# Patient Record
Sex: Female | Born: 1990 | Race: Black or African American | Hispanic: No | Smoking: Former smoker
Health system: Southern US, Community
[De-identification: ages and names within clinical notes are randomized; demographics above are authoritative.]

## PROBLEM LIST (undated history)

## (undated) DIAGNOSIS — J45909 Unspecified asthma, uncomplicated: Secondary | ICD-10-CM

## (undated) DIAGNOSIS — Z87442 Personal history of urinary calculi: Secondary | ICD-10-CM

## (undated) DIAGNOSIS — N946 Dysmenorrhea, unspecified: Secondary | ICD-10-CM

## (undated) HISTORY — DX: Dysmenorrhea, unspecified: N94.6

---
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
## 2012-04-25 ENCOUNTER — Emergency Department: Payer: Self-pay | Admitting: Unknown Physician Specialty

## 2012-04-25 LAB — RAPID INFLUENZA A&B ANTIGENS

## 2012-08-11 ENCOUNTER — Emergency Department: Payer: Self-pay | Admitting: Emergency Medicine

## 2012-08-11 LAB — COMPREHENSIVE METABOLIC PANEL
Alkaline Phosphatase: 72 U/L (ref 50–136)
Anion Gap: 3 — ABNORMAL LOW (ref 7–16)
Bilirubin,Total: 0.4 mg/dL (ref 0.2–1.0)
Co2: 30 mmol/L (ref 21–32)
Creatinine: 1.11 mg/dL (ref 0.60–1.30)
Glucose: 93 mg/dL (ref 65–99)
Osmolality: 273 (ref 275–301)
Potassium: 3.6 mmol/L (ref 3.5–5.1)
SGOT(AST): 26 U/L (ref 15–37)
SGPT (ALT): 27 U/L (ref 12–78)
Sodium: 138 mmol/L (ref 136–145)

## 2012-08-11 LAB — CBC
HCT: 35.9 % (ref 35.0–47.0)
HGB: 12.1 g/dL (ref 12.0–16.0)
MCH: 28.5 pg (ref 26.0–34.0)
MCHC: 33.6 g/dL (ref 32.0–36.0)
MCV: 85 fL (ref 80–100)
Platelet: 392 10*3/uL (ref 150–440)
RBC: 4.23 10*6/uL (ref 3.80–5.20)
RDW: 13.4 % (ref 11.5–14.5)
WBC: 13.6 10*3/uL — ABNORMAL HIGH (ref 3.6–11.0)

## 2012-08-11 LAB — URINALYSIS, COMPLETE
Glucose,UR: NEGATIVE mg/dL (ref 0–75)
Ketone: NEGATIVE
Nitrite: NEGATIVE
Specific Gravity: 1.008 (ref 1.003–1.030)
Squamous Epithelial: 2
WBC UR: 188 /HPF (ref 0–5)

## 2012-08-14 LAB — URINE CULTURE

## 2013-11-30 ENCOUNTER — Emergency Department: Payer: Self-pay | Admitting: Emergency Medicine

## 2014-03-04 ENCOUNTER — Emergency Department: Payer: Self-pay | Admitting: Emergency Medicine

## 2014-06-16 ENCOUNTER — Emergency Department: Payer: Self-pay | Admitting: Emergency Medicine

## 2015-03-30 ENCOUNTER — Emergency Department: Payer: No Typology Code available for payment source

## 2015-03-30 ENCOUNTER — Encounter: Payer: Self-pay | Admitting: *Deleted

## 2015-03-30 ENCOUNTER — Emergency Department
Admission: EM | Admit: 2015-03-30 | Discharge: 2015-03-30 | Disposition: A | Discharge status: Home / Self Care | Payer: No Typology Code available for payment source | Attending: Emergency Medicine | Admitting: Emergency Medicine

## 2015-03-30 DIAGNOSIS — S199XXA Unspecified injury of neck, initial encounter: Secondary | ICD-10-CM | POA: Diagnosis present

## 2015-03-30 DIAGNOSIS — S0083XA Contusion of other part of head, initial encounter: Secondary | ICD-10-CM

## 2015-03-30 DIAGNOSIS — Y998 Other external cause status: Secondary | ICD-10-CM | POA: Insufficient documentation

## 2015-03-30 DIAGNOSIS — S161XXA Strain of muscle, fascia and tendon at neck level, initial encounter: Secondary | ICD-10-CM | POA: Insufficient documentation

## 2015-03-30 DIAGNOSIS — Y9389 Activity, other specified: Secondary | ICD-10-CM | POA: Diagnosis not present

## 2015-03-30 DIAGNOSIS — Z3202 Encounter for pregnancy test, result negative: Secondary | ICD-10-CM | POA: Insufficient documentation

## 2015-03-30 DIAGNOSIS — Y92410 Unspecified street and highway as the place of occurrence of the external cause: Secondary | ICD-10-CM | POA: Diagnosis not present

## 2015-03-30 DIAGNOSIS — S0990XA Unspecified injury of head, initial encounter: Secondary | ICD-10-CM | POA: Insufficient documentation

## 2015-03-30 LAB — POCT PREGNANCY, URINE: Preg Test, Ur: NEGATIVE

## 2015-03-30 MED ORDER — HYDROCODONE-ACETAMINOPHEN 5-325 MG PO TABS
1.0000 | ORAL_TABLET | ORAL | Status: DC | PRN
Start: 2015-03-30 — End: 2018-04-16
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
## 2015-03-30 NOTE — Discharge Instructions (Signed)
Contusion A contusion is a deep bruise. Contusions happen when an injury causes bleeding under the skin. Symptoms of bruising include pain, swelling, and discolored skin. The skin may turn blue, purple, or yellow. HOME CARE   Rest the injured area.  If told, put ice on the injured area.  Put ice in a plastic bag.  Place a towel between your skin and the bag.  Leave the ice on for 20 minutes, 2-3 times per day.  If told, put light pressure (compression) on the injured area using an elastic bandage. Make sure the bandage is not too tight. Remove it and put it back on as told by your doctor.  If possible, raise (elevate) the injured area above the level of your heart while you are sitting or lying down.  Take over-the-counter and prescription medicines only as told by your doctor. GET HELP IF:  Your symptoms do not get better after several days of treatment.  Your symptoms get worse.  You have trouble moving the injured area. GET HELP RIGHT AWAY IF:   You have very bad pain.  You have a loss of feeling (numbness) in a hand or foot.  Your hand or foot turns pale or cold.   This information is not intended to replace advice given to you by your health care provider. Make sure you discuss any questions you have with your health care provider.   Document Released: 09/07/2007 Document Revised: 12/10/2014 Document Reviewed: 08/06/2014 Elsevier Interactive Patient Education 2016 [employer]  Cervical Sprain A cervical sprain is when the tissues (ligaments) that hold the neck bones in place stretch or tear. HOME CARE   Put ice on the injured area.  Put ice in a plastic bag.  Place a towel between your skin and the bag.  Leave the ice on for 15-20 minutes, 3-4 times a day.  You may have been given a collar to wear. This collar keeps your neck from moving while you heal.  Do not take the collar off unless told by your doctor.  If you have long hair, keep it outside of the  collar.  Ask your doctor before changing the position of your collar. You may need to change its position over time to make it more comfortable.  If you are allowed to take off the collar for cleaning or bathing, follow your doctor's instructions on how to do it safely.  Keep your collar clean by wiping it with mild soap and water. Dry it completely. If the collar has removable pads, remove them every 1-2 days to hand wash them with soap and water. Allow them to air dry. They should be dry before you wear them in the collar.  Do not drive while wearing the collar.  Only take medicine as told by your doctor.  Keep all doctor visits as told.  Keep all physical therapy visits as told.  Adjust your work station so that you have good posture while you work.  Avoid positions and activities that make your problems worse.  Warm up and stretch before being active. GET HELP IF:  Your pain is not controlled with medicine.  You cannot take less pain medicine over time as planned.  Your activity level does not improve as expected. GET HELP RIGHT AWAY IF:   You are bleeding.  Your stomach is upset.  You have an allergic reaction to your medicine.  You develop new problems that you cannot explain.  You lose feeling (become numb) or you  cannot move any part of your body (paralysis).  You have tingling or weakness in any part of your body.  Your symptoms get worse. Symptoms include:  Pain, soreness, stiffness, puffiness (swelling), or a burning feeling in your neck.  Pain when your neck is touched.  Shoulder or upper back pain.  Limited ability to move your neck.  Headache.  Dizziness.  Your hands or arms feel week, lose feeling, or tingle.  Muscle spasms.  Difficulty swallowing or chewing. MAKE SURE YOU:   Understand these instructions.  Will watch your condition.  Will get help right away if you are not doing well or get worse.   This information is not intended  to replace advice given to you by your health care provider. Make sure you discuss any questions you have with your health care provider.   Document Released: 09/07/2007 Document Revised: 11/21/2012 Document Reviewed: 09/26/2012 Elsevier Interactive Patient Education [employer]   Follow-up with your doctor at [facility] if any continued problems. Norco as needed for pain. Be aware that you cannot drive or operate machinery while taking this medication.
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
## 2015-04-05 DIAGNOSIS — G4730 Sleep apnea, unspecified: Secondary | ICD-10-CM

## 2015-04-05 HISTORY — DX: Sleep apnea, unspecified: G47.30

## 2015-06-08 ENCOUNTER — Emergency Department
Admission: EM | Admit: 2015-06-08 | Discharge: 2015-06-08 | Disposition: A | Discharge status: Home / Self Care | Payer: No Typology Code available for payment source | Attending: Emergency Medicine | Admitting: Emergency Medicine

## 2015-06-08 DIAGNOSIS — F17200 Nicotine dependence, unspecified, uncomplicated: Secondary | ICD-10-CM | POA: Insufficient documentation

## 2015-06-08 DIAGNOSIS — J069 Acute upper respiratory infection, unspecified: Secondary | ICD-10-CM | POA: Insufficient documentation

## 2015-06-08 MED ORDER — PROMETHAZINE-CODEINE 6.25-10 MG/5ML PO SYRP
5.0000 mL | ORAL_SOLUTION | Freq: Four times a day (QID) | ORAL | Status: DC | PRN
Start: 2015-06-08 — End: 2016-08-29

## 2015-06-08 MED ORDER — HYDROCOD POLST-CPM POLST ER 10-8 MG/5ML PO SUER
5.0000 mL | Freq: Once | ORAL | Status: AC
  Administered 2015-06-08: 5 mL via ORAL
  Filled 2015-06-08: qty 5

## 2015-06-08 NOTE — Discharge Instructions (Signed)

## 2015-06-08 NOTE — ED Provider Notes (Signed)
[facility] Emergency Department Provider Note  ____________________________________________  Time seen: Approximately 9:14 AM  I have reviewed the triage vital signs and the nursing notes.   HISTORY  Chief Complaint Cough and URI    HPI Carol Carter is a 25 y.o. female patient's complaint of cough congestion since yesterday. Patient denies any fever at this complaint. No palliative measures taken for this complaint. Patient states she does not take the flu shot. Patient rates the pain as a 7/10. Patient states chest wall discomfort with prolonged coughing.   No past medical history on file.  There are no active problems to display for this patient.   No past surgical history on file.  Current Outpatient Rx  Name  Route  Sig  Dispense  Refill  . HYDROcodone-acetaminophen (NORCO/VICODIN) 5-325 MG tablet   Oral   Take 1 tablet by mouth every 4 (four) hours as needed for moderate pain.   20 tablet   0   . promethazine-codeine (PHENERGAN WITH CODEINE) 6.25-10 MG/5ML syrup   Oral   Take 5 mLs by mouth every 6 (six) hours as needed for cough.   120 mL   0     Allergies Review of patient's allergies indicates no known allergies.  No family history on file.  Social History Social History  Substance Use Topics  . Smoking status: Current Every Day Smoker  . Smokeless tobacco: Not on file  . Alcohol Use: Yes    Review of Systems Constitutional: No fever/chills Eyes: No visual changes. ENT: No sore throat. Cardiovascular: Denies chest pain. Respiratory: Denies shortness of breath. Nonproductive cough Gastrointestinal: No abdominal pain.  No nausea, no vomiting.  No diarrhea.  No constipation. Genitourinary: Negative for dysuria. Musculoskeletal: Chest wall pain  Skin: Negative for rash. Neurological: Negative for headaches, focal weakness or numbness. 10-point ROS otherwise  negative.  ____________________________________________   PHYSICAL EXAM:  VITAL SIGNS: ED Triage Vitals  Enc Vitals Group     BP 06/08/15 0813 120/78 mmHg     Pulse Rate 06/08/15 0813 85     Resp 06/08/15 0813 18     Temp 06/08/15 0813 98.3 F (36.8 C)     Temp src --      SpO2 06/08/15 0813 96 %     Weight 06/08/15 0813 175 lb (79.379 kg)     Height 06/08/15 0813  (1.6 m)     Head Cir --      Peak Flow --      Pain Score 06/08/15 0815 7     Pain Loc --      Pain Edu? --      Excl. in GC? --     Constitutional: Alert and oriented. Well appearing and in no acute distress. Eyes: Conjunctivae are normal. PERRL. EOMI. Head: Atraumatic. Nose: No congestion/rhinnorhea. Mouth/Throat: Mucous membranes are moist.  Oropharynx non-erythematous. Neck: No stridor.  No cervical spine tenderness to palpation. Hematological/Lymphatic/Immunilogical: No cervical lymphadenopathy. Cardiovascular: Normal rate, regular rhythm. Grossly normal heart sounds.  Good peripheral circulation. Respiratory: Normal respiratory effort.  No retractions. Lungs CTAB. Gastrointestinal: Soft and nontender. No distention. No abdominal bruits. No CVA tenderness. Musculoskeletal: No lower extremity tenderness nor edema.  No joint effusions. Neurologic:  Normal speech and language. No gross focal neurologic deficits are appreciated. No gait instability. Skin:  Skin is warm, dry and intact. No rash noted. Psychiatric: Mood and affect are normal. Speech and behavior are normal.  ____________________________________________   LABS (all labs ordered  are listed, but only abnormal results are displayed)  Labs Reviewed - No data to display ____________________________________________  EKG   ____________________________________________  RADIOLOGY   ____________________________________________   PROCEDURES  Procedure(s) performed: None  Critical Care performed:  No  ____________________________________________   INITIAL IMPRESSION / ASSESSMENT AND PLAN / ED COURSE  Pertinent labs & imaging results that were available during my care of the patient were reviewed by me and considered in my medical decision making (see chart for details).  Upper respiratory infection. Patient given discharge care instructions. Appears given a work note for today. Patient given a prescription for Phenergan With Codeine take for cough. Patient advised follow-up family doctor for continued care. ____________________________________________   FINAL CLINICAL IMPRESSION(S) / ED DIAGNOSES  Final diagnoses:  URI (upper respiratory infection)      Joni Reiningonald K Karyl Sharrar, PA-C 06/08/15 [id number]  Rockne MenghiniAnne-Caroline Norman, MD 06/08/15 1536

## 2015-06-08 NOTE — ED Notes (Signed)
Pt reports cough congestion and uri symptoms since yesterday

## 2016-07-17 HISTORY — PX: WISDOM TOOTH EXTRACTION: SHX21

## 2016-08-29 ENCOUNTER — Encounter: Payer: Self-pay | Admitting: Emergency Medicine

## 2016-08-29 ENCOUNTER — Emergency Department: Payer: BLUE CROSS/BLUE SHIELD

## 2016-08-29 ENCOUNTER — Emergency Department
Admission: EM | Admit: 2016-08-29 | Discharge: 2016-08-29 | Disposition: A | Discharge status: Home / Self Care | Payer: BLUE CROSS/BLUE SHIELD | Attending: Emergency Medicine | Admitting: Emergency Medicine

## 2016-08-29 DIAGNOSIS — J069 Acute upper respiratory infection, unspecified: Secondary | ICD-10-CM | POA: Insufficient documentation

## 2016-08-29 DIAGNOSIS — R0981 Nasal congestion: Secondary | ICD-10-CM | POA: Diagnosis present

## 2016-08-29 DIAGNOSIS — F17200 Nicotine dependence, unspecified, uncomplicated: Secondary | ICD-10-CM | POA: Diagnosis not present

## 2016-08-29 LAB — COMPREHENSIVE METABOLIC PANEL
ALT: 17 U/L (ref 14–54)
AST: 24 U/L (ref 15–41)
Albumin: 4.1 g/dL (ref 3.5–5.0)
Alkaline Phosphatase: 63 U/L (ref 38–126)
Anion gap: 5 (ref 5–15)
BUN: 7 mg/dL (ref 6–20)
CO2: 26 mmol/L (ref 22–32)
CREATININE: 0.9 mg/dL (ref 0.44–1.00)
Calcium: 9.1 mg/dL (ref 8.9–10.3)
Chloride: 107 mmol/L (ref 101–111)
GFR calc Af Amer: >60 mL/min (ref >60)
GFR calc non Af Amer: >60 mL/min (ref >60)
Glucose, Bld: 99 mg/dL (ref 65–99)
Potassium: 3.8 mmol/L (ref 3.5–5.1)
SODIUM: 138 mmol/L (ref 135–145)
Total Bilirubin: 0.7 mg/dL (ref 0.3–1.2)
Total Protein: 8.3 g/dL — ABNORMAL HIGH (ref 6.5–8.1)

## 2016-08-29 LAB — CBC
HCT: 35.3 % (ref 35.0–47.0)
Hemoglobin: 12.1 g/dL (ref 12.0–16.0)
MCH: 29.3 pg (ref 26.0–34.0)
MCHC: 34.2 g/dL (ref 32.0–36.0)
MCV: 85.5 fL (ref 80.0–100.0)
Platelets: 449 10*3/uL — ABNORMAL HIGH (ref 150–440)
RBC: 4.13 MIL/uL (ref 3.80–5.20)
RDW: 13.3 % (ref 11.5–14.5)
WBC: 11.4 10*3/uL — AB (ref 3.6–11.0)

## 2016-08-29 MED ORDER — ACETAMINOPHEN 325 MG PO TABS
ORAL_TABLET | ORAL | Status: AC
  Filled 2016-08-29: qty 2

## 2016-08-29 MED ORDER — ACETAMINOPHEN 325 MG PO TABS
650.0000 mg | ORAL_TABLET | Freq: Once | ORAL | Status: AC
  Administered 2016-08-29: 650 mg via ORAL

## 2016-08-29 MED ORDER — GUAIFENESIN-CODEINE 100-10 MG/5ML PO SYRP: 5.0000 mL | ORAL_SOLUTION | Freq: Three times a day (TID) | ORAL | 0 refills | Status: DC | PRN

## 2016-08-29 NOTE — ED Triage Notes (Signed)
Pt reports sinus congestion, cough, sore throat. Pt has temperature today. Pt reports pain when coughing.

## 2016-08-30 NOTE — ED Provider Notes (Signed)
[facility] Emergency Department Provider Note  ____________________________________________  Time seen: Approximately 7:37 AM  I have reviewed the triage vital signs and the nursing notes.   HISTORY  Chief Complaint URI   HPI Carol Carter is a 26 y.o. female who presents to the emergency department for evaluation of sinus congestion, cough, sore throat for the past 3 days with fever today. She denies nausea, vomiting, or diarrhea. She has taken ibuprofen and over-the-counter cold medications without any relief.   History reviewed. No pertinent past medical history.  There are no active problems to display for this patient.   History reviewed. No pertinent surgical history.  Prior to Admission medications   Medication Sig Start Date End Date Taking? Authorizing Provider  guaiFENesin-codeine (ROBITUSSIN AC) 100-10 MG/5ML syrup Take 5 mLs by mouth 3 (three) times daily as needed for cough. 08/29/16   Advait Buice, Rulon Eisenmengerari B, FNP  HYDROcodone-acetaminophen (NORCO/VICODIN) 5-325 MG tablet Take 1 tablet by mouth every 4 (four) hours as needed for moderate pain. 03/30/15   Tommi RumpsSummers, Rhonda L, PA-C    Allergies Patient has no known allergies.  No family history on file.  Social History Social History  Substance Use Topics  . Smoking status: Current Every Day Smoker  . Smokeless tobacco: Never Used  . Alcohol use Yes    Review of Systems Constitutional: Positive for fever/chills ENT: Positive for sore throat. Cardiovascular: Denies chest pain. Respiratory: Negative for shortness of breath. Positive for cough. Gastrointestinal: Negative for nausea,  no vomiting.  Negative for diarrhea.  Musculoskeletal: Positive for body aches Skin: Negative for rash. Neurological: Positive for headaches ____________________________________________   PHYSICAL EXAM:  VITAL SIGNS: ED Triage Vitals  Enc Vitals Group     BP 08/29/16 1134 120/89     Pulse Rate  08/29/16 1134 (!) 126     Resp 08/29/16 1134 16     Temp 08/29/16 1133 100.1 F (37.8 C)     Temp Source 08/29/16 1134 Oral     SpO2 08/29/16 1134 96 %     Weight 08/29/16 1133 160 lb (72.6 kg)     Height 08/29/16 1133 5\' 3"  (1.6 m)     Head Circumference --      Peak Flow --      Pain Score 08/29/16 1132 8     Pain Loc --      Pain Edu? --      Excl. in GC? --     Constitutional: Alert and oriented. Not acutely ill appearing and in no acute distress. Eyes: Conjunctivae are normal. EOMI. Ears: Bilateral tympanic membranes are within normal limits Nose: Maxillary congestion noted; no rhinnorhea. Mouth/Throat: Mucous membranes are moist.  Oropharynx mildly erythematous. Tonsils not visualized. Neck: No stridor.  Lymphatic: No cervical lymphadenopathy. Cardiovascular: Normal rate, regular rhythm. Good peripheral circulation. Respiratory: Normal respiratory effort.  No retractions. Breath sounds clear to auscultation throughout. Gastrointestinal: Soft and nontender.  Musculoskeletal: FROM x 4 extremities.  Neurologic:  Normal speech and language.  Skin:  Skin is warm, dry and intact. No rash noted. Psychiatric: Mood and affect are normal. Speech and behavior are normal.  ____________________________________________   LABS (all labs ordered are listed, but only abnormal results are displayed)  Labs Reviewed  CBC - Abnormal; Notable for the following:       Result Value   WBC 11.4 (*)    Platelets 449 (*)    All other components within normal limits  COMPREHENSIVE METABOLIC PANEL - Abnormal; Notable  for the following:    Total Protein 8.3 (*)    All other components within normal limits   ____________________________________________  EKG  Not indicated ____________________________________________  RADIOLOGY  Chest x-ray does not show any acute cardiopulmonary abnormality per radiology. ____________________________________________   PROCEDURES  Procedure(s)  performed: None  Critical Care performed: No ____________________________________________   INITIAL IMPRESSION / ASSESSMENT AND PLAN / ED COURSE  26 year old female presenting to the emergency department for evaluation. Symptoms and exam consistent with viral syndrome. She will be given prescription for Robitussin-AC and advised to follow-up with her primary care provider for symptoms that are not improving over the next few days. She was advised to return to the emergency department for symptoms that change or worsen if she is unable schedule appointment.  Pertinent labs & imaging results that were available during my care of the patient were reviewed by me and considered in my medical decision making (see chart for details).  Discharge Medication List as of 08/29/2016  1:58 PM    START taking these medications   Details  guaiFENesin-codeine (ROBITUSSIN AC) 100-10 MG/5ML syrup Take 5 mLs by mouth 3 (three) times daily as needed for cough., Starting Mon 08/29/2016, Print        If controlled substance prescribed during this visit, 12 month history viewed on the [facility] prior to issuing an initial prescription for Schedule II or III opiod. ____________________________________________   FINAL CLINICAL IMPRESSION(S) / ED DIAGNOSES  Final diagnoses:  Acute upper respiratory infection    Note:  This document was prepared using Dragon voice recognition software and may include unintentional dictation errors.     Chinita Pester, FNP 08/30/16 1610    Minna Antis, MD 08/30/16 [phone]

## 2017-01-02 ENCOUNTER — Emergency Department
Admission: EM | Admit: 2017-01-02 | Discharge: 2017-01-02 | Disposition: A | Discharge status: Home / Self Care | Payer: BLUE CROSS/BLUE SHIELD | Attending: Emergency Medicine | Admitting: Emergency Medicine

## 2017-01-02 ENCOUNTER — Emergency Department: Payer: BLUE CROSS/BLUE SHIELD

## 2017-01-02 DIAGNOSIS — R091 Pleurisy: Secondary | ICD-10-CM | POA: Diagnosis not present

## 2017-01-02 DIAGNOSIS — R102 Pelvic and perineal pain: Secondary | ICD-10-CM | POA: Insufficient documentation

## 2017-01-02 DIAGNOSIS — F17210 Nicotine dependence, cigarettes, uncomplicated: Secondary | ICD-10-CM | POA: Insufficient documentation

## 2017-01-02 DIAGNOSIS — R0789 Other chest pain: Secondary | ICD-10-CM | POA: Diagnosis present

## 2017-01-02 DIAGNOSIS — N946 Dysmenorrhea, unspecified: Secondary | ICD-10-CM

## 2017-01-02 LAB — URINALYSIS, COMPLETE (UACMP) WITH MICROSCOPIC
Bacteria, UA: NONE SEEN
Bilirubin Urine: NEGATIVE
Glucose, UA: NEGATIVE mg/dL
Ketones, ur: NEGATIVE mg/dL
Leukocytes, UA: NEGATIVE
Nitrite: NEGATIVE
PROTEIN: NEGATIVE mg/dL
SPECIFIC GRAVITY, URINE: 1.014 (ref 1.005–1.030)
pH: 5 (ref 5.0–8.0)

## 2017-01-02 LAB — CBC
HEMATOCRIT: 35.9 % (ref 35.0–47.0)
HEMOGLOBIN: 12.2 g/dL (ref 12.0–16.0)
MCH: 28.9 pg (ref 26.0–34.0)
MCHC: 33.9 g/dL (ref 32.0–36.0)
MCV: 85.3 fL (ref 80.0–100.0)
Platelets: 438 10*3/uL (ref 150–440)
RBC: 4.21 MIL/uL (ref 3.80–5.20)
RDW: 13.6 % (ref 11.5–14.5)
WBC: 8.8 10*3/uL (ref 3.6–11.0)

## 2017-01-02 LAB — COMPREHENSIVE METABOLIC PANEL
ALBUMIN: 4.2 g/dL (ref 3.5–5.0)
ALK PHOS: 44 U/L (ref 38–126)
ALT: 11 U/L — AB (ref 14–54)
AST: 17 U/L (ref 15–41)
Anion gap: 5 (ref 5–15)
BUN: 9 mg/dL (ref 6–20)
CALCIUM: 9.1 mg/dL (ref 8.9–10.3)
CO2: 29 mmol/L (ref 22–32)
CREATININE: 0.89 mg/dL (ref 0.44–1.00)
Chloride: 105 mmol/L (ref 101–111)
GFR calc Af Amer: >60 mL/min (ref >60)
GFR calc non Af Amer: >60 mL/min (ref >60)
GLUCOSE: 96 mg/dL (ref 65–99)
Potassium: 3.7 mmol/L (ref 3.5–5.1)
SODIUM: 139 mmol/L (ref 135–145)
Total Bilirubin: 0.3 mg/dL (ref 0.3–1.2)
Total Protein: 7.8 g/dL (ref 6.5–8.1)

## 2017-01-02 LAB — TROPONIN I: Troponin I: <0.03 ng/mL (ref <0.03)

## 2017-01-02 LAB — POCT PREGNANCY, URINE: PREG TEST UR: NEGATIVE

## 2017-01-02 MED ORDER — IBUPROFEN 600 MG PO TABS: 600.0000 mg | ORAL_TABLET | Freq: Three times a day (TID) | ORAL | 0 refills | Status: DC | PRN

## 2017-01-02 NOTE — Discharge Instructions (Signed)
Please take your pain medication as prescribed 3 times a day for severe symptoms. Please also make an appointment to reestablish care with your [facility] gynecologist within the next few weeks for recheck. Return to the emergency department sooner for any concerns.  It was a pleasure to take care of you today, and thank you for coming to our emergency department.  If you have any questions or concerns before leaving please ask the nurse to grab me and I'm more than happy to go through your aftercare instructions again.  If you were prescribed any opioid pain medication today such as Norco, Vicodin, Percocet, morphine, hydrocodone, or oxycodone please make sure you do not drive when you are taking this medication as it can alter your ability to drive safely.  If you have any concerns once you are home that you are not improving or are in fact getting worse before you can make it to your follow-up appointment, please do not hesitate to call 911 and come back for further evaluation.  Merrily Brittle, MD  Results for orders placed or performed during the hospital encounter of 01/02/17  CBC  Result Value Ref Range   WBC 8.8 3.6 - 11.0 K/uL   RBC 4.21 3.80 - 5.20 MIL/uL   Hemoglobin 12.2 12.0 - 16.0 g/dL   HCT 16.1 09.6 - 04.5 %   MCV 85.3 80.0 - 100.0 fL   MCH 28.9 26.0 - 34.0 pg   MCHC 33.9 32.0 - 36.0 g/dL   RDW 40.9 81.1 - 91.4 %   Platelets 438 150 - 440 K/uL  Troponin I  Result Value Ref Range   Troponin I <0.03 <0.03 ng/mL  Comprehensive metabolic panel  Result Value Ref Range   Sodium 139 135 - 145 mmol/L   Potassium 3.7 3.5 - 5.1 mmol/L   Chloride 105 101 - 111 mmol/L   CO2 29 22 - 32 mmol/L   Glucose, Bld 96 65 - 99 mg/dL   BUN 9 6 - 20 mg/dL   Creatinine, Ser 7.82 0.44 - 1.00 mg/dL   Calcium 9.1 8.9 - 95.6 mg/dL   Total Protein 7.8 6.5 - 8.1 g/dL   Albumin 4.2 3.5 - 5.0 g/dL   AST 17 15 - 41 U/L   ALT 11 (L) 14 - 54 U/L   Alkaline Phosphatase 44 38 - 126 U/L   Total Bilirubin 0.3  0.3 - 1.2 mg/dL   GFR calc non Af Amer >60 >60 mL/min   GFR calc Af Amer >60 >60 mL/min   Anion gap 5 5 - 15  Urinalysis, Complete w Microscopic  Result Value Ref Range   Color, Urine YELLOW (A) YELLOW   APPearance CLEAR (A) CLEAR   Specific Gravity, Urine 1.014 1.005 - 1.030   pH 5.0 5.0 - 8.0   Glucose, UA NEGATIVE NEGATIVE mg/dL   Hgb urine dipstick MODERATE (A) NEGATIVE   Bilirubin Urine NEGATIVE NEGATIVE   Ketones, ur NEGATIVE NEGATIVE mg/dL   Protein, ur NEGATIVE NEGATIVE mg/dL   Nitrite NEGATIVE NEGATIVE   Leukocytes, UA NEGATIVE NEGATIVE   RBC / HPF TOO NUMEROUS TO COUNT 0 - 5 RBC/hpf   WBC, UA 0-5 0 - 5 WBC/hpf   Bacteria, UA NONE SEEN NONE SEEN   Squamous Epithelial / LPF 0-5 (A) NONE SEEN   Mucus PRESENT   Pregnancy, urine POC  Result Value Ref Range   Preg Test, Ur NEGATIVE NEGATIVE   Dg Chest 2 View  Result Date: 01/02/2017 CLINICAL DATA:  Vaginal bleeding starting 2 days ago. Right chest pain. EXAM: CHEST  2 VIEW COMPARISON:  08/29/2016 FINDINGS: The heart size and mediastinal contours are within normal limits. Both lungs are clear. The visualized skeletal structures are unremarkable. IMPRESSION: No active cardiopulmonary disease. Electronically Signed   By: Gaylyn Rong M.D.   On: 01/02/2017 16:27

## 2017-01-02 NOTE — ED Triage Notes (Signed)
Pt c/o bright red heavy vaginal bleeding X 2 days ago, bleeding has lightened since but having lower abdominal cramping. Pt also c/o right sided sharp and stabbing chest pain to area of right ribs. Pt alert and oriented X4, active, cooperative, pt in NAD. RR even and unlabored, color WNL.

## 2017-01-02 NOTE — ED Provider Notes (Signed)
[facility] Emergency Department Provider Note  ____________________________________________   First MD Initiated Contact with Patient 01/02/17 1808     (approximate)  I have reviewed the triage vital signs and the nursing notes.   HISTORY  Chief Complaint Vaginal Bleeding and Chest Pain    HPI Tierany N Peil is a 26 y.o. female with 2 days of severe pelvic cramping along with heavy vaginal bleeding. Her last menstrual period prior to this was August 29. She reports sharp right sided right lateral chest pain radiating across her upper chest that began today. The pain is mild worse with deep inspiration and improved with rest.she has no history of DVT or pulmonary embolism. No leg swelling. No recent surgery or immobilization.   History reviewed. No pertinent past medical history.  There are no active problems to display for this patient.   History reviewed. No pertinent surgical history.  Prior to Admission medications   Medication Sig Start Date End Date Taking? Authorizing Provider  guaiFENesin-codeine (ROBITUSSIN AC) 100-10 MG/5ML syrup Take 5 mLs by mouth 3 (three) times daily as needed for cough. 08/29/16   Triplett, Rulon Eisenmenger B, FNP  HYDROcodone-acetaminophen (NORCO/VICODIN) 5-325 MG tablet Take 1 tablet by mouth every 4 (four) hours as needed for moderate pain. 03/30/15   Tommi Rumps, PA-C  ibuprofen (ADVIL,MOTRIN) 600 MG tablet Take 1 tablet (600 mg total) by mouth every 8 (eight) hours as needed. 01/02/17   Merrily Brittle, MD    Allergies Patient has no known allergies.  No family history on file.  Social History Social History  Substance Use Topics  . Smoking status: Current Every Day Smoker  . Smokeless tobacco: Never Used  . Alcohol use Yes    Review of Systems Constitutional: No fever/chills ENT: No sore throat. Cardiovascular: positive for chest pain. Respiratory: Denies shortness of breath. Gastrointestinal: positive for  abdominal pain.  positive for nausea, no vomiting.  No diarrhea.  No constipation. Musculoskeletal: Negative for back pain. Neurological: Negative for headaches   ____________________________________________   PHYSICAL EXAM:  VITAL SIGNS: ED Triage Vitals [01/02/17 1600]  Enc Vitals Group     BP 138/90     Pulse Rate 85     Resp 18     Temp 99.4 F (37.4 C)     Temp Source Oral     SpO2 100 %     Weight 162 lb (73.5 kg)     Height  (1.6 m)     Head Circumference      Peak Flow      Pain Score 4     Pain Loc      Pain Edu?      Excl. in GC?     Constitutional: alert and oriented 4 joking and laughing well appearing nontoxic no diaphoresis speaks full clear sentences Head: Atraumatic. Nose: No congestion/rhinnorhea. Mouth/Throat: No trismus Neck: No stridor.   Cardiovascular: regular rate and rhythm focally tender along right lateral chest Respiratory: Normal respiratory effort.  No retractions. clear to auscultation bilaterally Gastrointestinal: soft nontender Neurologic:  Normal speech and language. No gross focal neurologic deficits are appreciated.  Skin:  Skin is warm, dry and intact. No rash noted.    ____________________________________________  LABS (all labs ordered are listed, but only abnormal results are displayed)  Labs Reviewed  COMPREHENSIVE METABOLIC PANEL - Abnormal; Notable for the following:       Result Value   ALT 11 (*)    All other components within  normal limits  URINALYSIS, COMPLETE (UACMP) WITH MICROSCOPIC - Abnormal; Notable for the following:    Color, Urine YELLOW (*)    APPearance CLEAR (*)    Hgb urine dipstick MODERATE (*)    Squamous Epithelial / LPF 0-5 (*)    All other components within normal limits  CBC  TROPONIN I  POC URINE PREG, ED  POCT PREGNANCY, URINE    blood work reviewed and interpreted by me shows no acute disease __________________________________________  EKG  ED ECG REPORT I, Merrily Brittle,  the attending physician, personally viewed and interpreted this ECG.  Date: 01/02/2017 EKG Time:  Rate: 58 Rhythm: sinus bradycardia QRS Axis: normal Intervals: normal ST/T Wave abnormalities: normal Narrative Interpretation: no evidence of acute ischemia  ____________________________________________  RADIOLOGY  chest x-ray reviewed by me shows no acute disease ____________________________________________   PROCEDURES  Procedure(s) performed: no  Procedures  Critical Care performed: no  Observation: no ____________________________________________   INITIAL IMPRESSION / ASSESSMENT AND PLAN / ED COURSE  Pertinent labs & imaging results that were available during my care of the patient were reviewed by me and considered in my medical decision making (see chart for details).  the patient arrives 2 issues. First her chest pain is extremely atypical and pleuritic. Doubt pulmonary embolism as she is PERC negative.  regarding her pelvic pain and vaginal bleeding she has no signs of anemia and she is only been bleeding for 2 days. I've advised her to follow up with her OB gynecologist. NSAIDs will work for both of these disease processes. Strict return precautions given and she is medically stable for outpatient management verbalizes understanding and agreement with the plan.      ____________________________________________   FINAL CLINICAL IMPRESSION(S) / ED DIAGNOSES  Final diagnoses:  Menstrual cramps  Pleurisy      NEW MEDICATIONS STARTED DURING THIS VISIT:  Discharge Medication List as of 01/02/2017  6:20 PM    START taking these medications   Details  ibuprofen (ADVIL,MOTRIN) 600 MG tablet Take 1 tablet (600 mg total) by mouth every 8 (eight) hours as needed., Starting Mon 01/02/2017, Print         Note:  This document was prepared using Dragon voice recognition software and may include unintentional dictation errors.      Merrily Brittle,  MD 01/02/17 [phone]

## 2017-05-08 ENCOUNTER — Encounter: Payer: Self-pay | Admitting: Emergency Medicine

## 2017-05-08 ENCOUNTER — Other Ambulatory Visit: Payer: Self-pay

## 2017-05-08 ENCOUNTER — Emergency Department
Admission: EM | Admit: 2017-05-08 | Discharge: 2017-05-08 | Disposition: A | Discharge status: Home / Self Care | Payer: 59 | Attending: Emergency Medicine | Admitting: Emergency Medicine

## 2017-05-08 ENCOUNTER — Emergency Department: Payer: 59

## 2017-05-08 DIAGNOSIS — K529 Noninfective gastroenteritis and colitis, unspecified: Secondary | ICD-10-CM

## 2017-05-08 DIAGNOSIS — F17210 Nicotine dependence, cigarettes, uncomplicated: Secondary | ICD-10-CM | POA: Diagnosis not present

## 2017-05-08 DIAGNOSIS — R1011 Right upper quadrant pain: Secondary | ICD-10-CM | POA: Diagnosis not present

## 2017-05-08 DIAGNOSIS — R1010 Upper abdominal pain, unspecified: Secondary | ICD-10-CM | POA: Diagnosis present

## 2017-05-08 LAB — CBC
HEMATOCRIT: 40.2 % (ref 35.0–47.0)
HEMOGLOBIN: 13.4 g/dL (ref 12.0–16.0)
MCH: 29 pg (ref 26.0–34.0)
MCHC: 33.3 g/dL (ref 32.0–36.0)
MCV: 87.1 fL (ref 80.0–100.0)
Platelets: 456 10*3/uL — ABNORMAL HIGH (ref 150–440)
RBC: 4.62 MIL/uL (ref 3.80–5.20)
RDW: 14 % (ref 11.5–14.5)
WBC: 4.7 10*3/uL (ref 3.6–11.0)

## 2017-05-08 LAB — COMPREHENSIVE METABOLIC PANEL
ALBUMIN: 4.3 g/dL (ref 3.5–5.0)
ALK PHOS: 57 U/L (ref 38–126)
ALT: 12 U/L — ABNORMAL LOW (ref 14–54)
ANION GAP: 7 (ref 5–15)
AST: 19 U/L (ref 15–41)
BILIRUBIN TOTAL: 0.6 mg/dL (ref 0.3–1.2)
BUN: 9 mg/dL (ref 6–20)
CALCIUM: 9.2 mg/dL (ref 8.9–10.3)
CO2: 27 mmol/L (ref 22–32)
Chloride: 103 mmol/L (ref 101–111)
Creatinine, Ser: 0.9 mg/dL (ref 0.44–1.00)
GFR calc Af Amer: >60 mL/min (ref >60)
GFR calc non Af Amer: >60 mL/min (ref >60)
GLUCOSE: 93 mg/dL (ref 65–99)
POTASSIUM: 3.5 mmol/L (ref 3.5–5.1)
SODIUM: 137 mmol/L (ref 135–145)
TOTAL PROTEIN: 8.6 g/dL — AB (ref 6.5–8.1)

## 2017-05-08 LAB — URINALYSIS, COMPLETE (UACMP) WITH MICROSCOPIC
Bilirubin Urine: NEGATIVE
Glucose, UA: NEGATIVE mg/dL
Ketones, ur: NEGATIVE mg/dL
NITRITE: NEGATIVE
PH: 5 (ref 5.0–8.0)
Protein, ur: NEGATIVE mg/dL
SPECIFIC GRAVITY, URINE: 1.015 (ref 1.005–1.030)

## 2017-05-08 LAB — LIPASE, BLOOD: Lipase: 20 U/L (ref 11–51)

## 2017-05-08 LAB — POCT PREGNANCY, URINE: PREG TEST UR: NEGATIVE

## 2017-05-08 MED ORDER — ONDANSETRON HCL 4 MG PO TABS
4.0000 mg | ORAL_TABLET | Freq: Once | ORAL | Status: AC
  Administered 2017-05-08: 4 mg via ORAL
  Filled 2017-05-08: qty 1

## 2017-05-08 MED ORDER — ONDANSETRON 8 MG PO TBDP: 8.0000 mg | ORAL_TABLET | Freq: Three times a day (TID) | ORAL | 0 refills | Status: DC | PRN

## 2017-05-08 NOTE — ED Notes (Signed)
Pt taken to US

## 2017-05-08 NOTE — ED Triage Notes (Signed)
Abdominal pain, nausea and vomiting x 2 days.  

## 2017-05-08 NOTE — ED Notes (Signed)
Pt c/o N/V/D for the past 2 days with lower abd pain. Today vomiting x4, diarrhea x2..Marland Kitchen

## 2017-05-09 NOTE — ED Provider Notes (Signed)
[facility] Emergency Department Provider Note    First MD Initiated Contact with Patient 05/08/17 1535     (approximate)  I have reviewed the triage vital signs and the nursing notes.   HISTORY  Chief Complaint Abdominal Pain    HPI Carol Carter is a 27 y.o. female presents to the emergency department with a 2-day history of upper abdominal discomfort associated with nausea vomiting and nonbloody diarrhea.  Patient denies any fever.  Patient states that symptoms began on Saturday and has improved since onset.  Patient states last diarrheal episode was yesterday however nausea and vomiting has persisted.  Patient states her current pain score for her abdominal discomfort is 6 out of 10 and described as aching.   Past medical history None There are no active problems to display for this patient.   Past surgical history None  Prior to Admission medications   Medication Sig Start Date End Date Taking? Authorizing Provider  guaiFENesin-codeine (ROBITUSSIN AC) 100-10 MG/5ML syrup Take 5 mLs by mouth 3 (three) times daily as needed for cough. 08/29/16   Triplett, Rulon Eisenmengerari B, FNP  HYDROcodone-acetaminophen (NORCO/VICODIN) 5-325 MG tablet Take 1 tablet by mouth every 4 (four) hours as needed for moderate pain. 03/30/15   Tommi RumpsSummers, Rhonda L, PA-C  ibuprofen (ADVIL,MOTRIN) 600 MG tablet Take 1 tablet (600 mg total) by mouth every 8 (eight) hours as needed. 01/02/17   Merrily Brittleifenbark, Neil, MD  ondansetron (ZOFRAN ODT) 8 MG disintegrating tablet Take 1 tablet (8 mg total) by mouth every 8 (eight) hours as needed for nausea or vomiting. 05/08/17   Darci CurrentBrown, [place] N, MD    Allergies No known drug allergies No family history on file.  Social History Social History   Tobacco Use  . Smoking status: Current Every Day Smoker    Packs/day: 0.10    Types: Cigarettes  . Smokeless tobacco: Never Used  Substance Use Topics  . Alcohol use: Yes  . Drug use: No    Review of  Systems Constitutional: No fever/chills Eyes: No visual changes. ENT: No sore throat. Cardiovascular: Denies chest pain. Respiratory: Denies shortness of breath. Gastrointestinal: Positive for abdominal pain vomiting and diarrhea. Genitourinary: Negative for dysuria. Musculoskeletal: Negative for neck pain.  Negative for back pain. Integumentary: Negative for rash. Neurological: Negative for headaches, focal weakness or numbness.   ____________________________________________   PHYSICAL EXAM:  VITAL SIGNS: ED Triage Vitals  Enc Vitals Group     BP 05/08/17 1311 111/79     Pulse Rate 05/08/17 1311 70     Resp 05/08/17 1311 20     Temp 05/08/17 1311 98.7 F (37.1 C)     Temp Source 05/08/17 1311 Oral     SpO2 05/08/17 1311 98 %     Weight 05/08/17 1312 68.9 kg (152 lb)     Height 05/08/17 1312 1.6 m (5\' 3" )     Head Circumference --      Peak Flow --      Pain Score 05/08/17 1312 8     Pain Loc --      Pain Edu? --      Excl. in GC? --     Constitutional: Alert and oriented. Well appearing and in no acute distress. Eyes: Conjunctivae are normal.  Head: Atraumatic. Mouth/Throat: Mucous membranes are moist. Oropharynx non-erythematous. Neck: No stridor.  Cardiovascular: Normal rate, regular rhythm. Good peripheral circulation. Grossly normal heart sounds. Respiratory: Normal respiratory effort.  No retractions. Lungs CTAB. Gastrointestinal: Soft and  nontender. No distention.  Musculoskeletal: No lower extremity tenderness nor edema. No gross deformities of extremities. Neurologic:  Normal speech and language. No gross focal neurologic deficits are appreciated.  Skin:  Skin is warm, dry and intact. No rash noted. Psychiatric: Mood and affect are normal. Speech and behavior are normal.  ____________________________________________   LABS (all labs ordered are listed, but only abnormal results are displayed)  Labs Reviewed  COMPREHENSIVE METABOLIC PANEL - Abnormal;  Notable for the following components:      Result Value   Total Protein 8.6 (*)    ALT 12 (*)    All other components within normal limits  CBC - Abnormal; Notable for the following components:   Platelets 456 (*)    All other components within normal limits  URINALYSIS, COMPLETE (UACMP) WITH MICROSCOPIC - Abnormal; Notable for the following components:   Color, Urine YELLOW (*)    APPearance CLOUDY (*)    Hgb urine dipstick SMALL (*)    Leukocytes, UA SMALL (*)    All other components within normal limits  LIPASE, BLOOD  POC URINE PREG, ED  POCT PREGNANCY, URINE    RADIOLOGY I, [place] N Abed Schar, personally viewed and evaluated these images (plain radiographs) as part of my medical decision making, as well as reviewing the written report by the radiologist.  CLINICAL DATA:  Right upper quadrant pain for 3 days  EXAM: ULTRASOUND ABDOMEN LIMITED RIGHT UPPER QUADRANT  COMPARISON:  None.  FINDINGS: Gallbladder:  No gallstones or wall thickening visualized. No sonographic Murphy sign noted by sonographer.  Common bile duct:  Diameter: 2 mm.  Liver:  No focal lesion identified. Within normal limits in parenchymal echogenicity. Portal vein is patent on color Doppler imaging with normal direction of blood flow towards the liver.  IMPRESSION: No acute abnormality noted.   Electronically Signed   By: Alcide Clever M.D.   On: 05/08/2017 16:31  Official radiology report(s): No results found.    Procedures   ____________________________________________   INITIAL IMPRESSION / ASSESSMENT AND PLAN / ED COURSE  As part of my medical decision making, I reviewed the following data within the electronic MEDICAL RECORD 27 year old female present with above-stated history of nausea vomiting and nonbloody diarrhea which is improving since Saturday.  Physical exam revealed no evidence of abdominal pain however patient did admit to upper abdominal pain and as such  ultrasound of the abdomen performed given family history of gallstones.  Ultrasound was negative.  Laboratory data unremarkable suspect gastroenteritis as etiology for the patient's discomfort.  Patient received Zofran and IV normal saline the emergency department with improvement of symptoms.  Patient is advised to return to the emergency department if pain persists or vomiting and diarrhea.  ___________________________  FINAL CLINICAL IMPRESSION(S) / ED DIAGNOSES  Final diagnoses:  RUQ pain  Gastroenteritis     MEDICATIONS GIVEN DURING THIS VISIT:  Medications  ondansetron (ZOFRAN) tablet 4 mg (4 mg Oral Given 05/08/17 1633)     ED Discharge Orders        Ordered    ondansetron (ZOFRAN ODT) 8 MG disintegrating tablet  Every 8 hours PRN     05/08/17 1638       Note:  This document was prepared using Dragon voice recognition software and may include unintentional dictation errors.    Darci Current, MD 05/09/17 [phone]

## 2017-09-15 ENCOUNTER — Ambulatory Visit (INDEPENDENT_AMBULATORY_CARE_PROVIDER_SITE_OTHER): Payer: 59 | Admitting: Obstetrics & Gynecology

## 2017-09-15 ENCOUNTER — Other Ambulatory Visit: Payer: Self-pay

## 2017-09-15 ENCOUNTER — Encounter: Payer: Self-pay | Admitting: Obstetrics & Gynecology

## 2017-09-15 VITALS — BP 110/70 | HR 100 | Ht 63.0 in | Wt 164.0 lb

## 2017-09-15 DIAGNOSIS — Z131 Encounter for screening for diabetes mellitus: Secondary | ICD-10-CM | POA: Diagnosis not present

## 2017-09-15 DIAGNOSIS — Z202 Contact with and (suspected) exposure to infections with a predominantly sexual mode of transmission: Secondary | ICD-10-CM | POA: Diagnosis not present

## 2017-09-15 DIAGNOSIS — Z1329 Encounter for screening for other suspected endocrine disorder: Secondary | ICD-10-CM | POA: Diagnosis not present

## 2017-09-15 DIAGNOSIS — R102 Pelvic and perineal pain: Secondary | ICD-10-CM | POA: Diagnosis not present

## 2017-09-15 DIAGNOSIS — Z124 Encounter for screening for malignant neoplasm of cervix: Secondary | ICD-10-CM

## 2017-09-15 DIAGNOSIS — N921 Excessive and frequent menstruation with irregular cycle: Secondary | ICD-10-CM | POA: Diagnosis not present

## 2017-09-15 LAB — POCT URINE PREGNANCY: PREG TEST UR: NEGATIVE

## 2017-09-15 NOTE — Patient Instructions (Signed)
Pelvic Pain, Female °Pelvic pain is pain in your lower belly (abdomen), below your belly button and between your hips. The pain may start suddenly (acute), keep coming back (recurring), or last a long time (chronic). Pelvic pain that lasts longer than six months is considered chronic. There are many causes of pelvic pain. Sometimes the cause of your pelvic pain is not known. °Follow these instructions at home: °· Take over-the-counter and prescription medicines only as told by your doctor. °· Rest as told by your doctor. °· Do not have sex it if hurts. °· Keep a journal of your pelvic pain. Write down: °? When the pain started. °? Where the pain is located. °? What seems to make the pain better or worse, such as food or your menstrual cycle. °? Any symptoms you have along with the pain. °· Keep all follow-up visits as told by your doctor. This is important. °Contact a doctor if: °· Medicine does not help your pain. °· Your pain comes back. °· You have new symptoms. °· You have unusual vaginal discharge or bleeding. °· You have a fever or chills. °· You are having a hard time pooping (constipation). °· You have blood in your pee (urine) or poop (stool). °· Your pee smells bad. °· You feel weak or lightheaded. °Get help right away if: °· You have sudden pain that is very bad. °· Your pain continues to get worse. °· You have very bad pain and also have any of the following symptoms: °? A fever. °? Feeling stick to your stomach (nausea). °? Throwing up (vomiting). °? Being very sweaty. °· You pass out (lose consciousness). °This information is not intended to replace advice given to you by your health care provider. Make sure you discuss any questions you have with your health care provider. °Document Released: 09/07/2007 Document Revised: 04/15/2015 Document Reviewed: 01/09/2015 °Elsevier Interactive Patient Education © 2018 [employer] Inc[employer] ° °

## 2017-09-15 NOTE — Progress Notes (Signed)
HPI:      [name] is a 27 y.o. G0P0000 who LMP was Patient's last menstrual period was 08/18/2017., she presents today for her annual examination. The patient has concerns over inability to conceive.  Also has lower pelvic pains at times and irreg menses.  Was told in past she has PCOS.  Period this year are Jan, Feb, May.. The patient is sexually active, same partner 2 years, no BC but has not been actively trying for pregnancy until recently.  [facility]; Clomid, Metformin, hCG shots, no success in 3 cycles of trying (different partner then). Denies dyspareunia.  Her last pap: was normal. The patient does perform self breast exams.  There is no notable family history of breast or ovarian cancer in her family.  The patient has regular exercise: yes.  The patient denies current symptoms of depression.    GYN History: Contraception: none  PMHx: Past Medical History:  Diagnosis Date  . Dysmenorrhea    Past Surgical History:  Procedure Laterality Date  . WISDOM TOOTH EXTRACTION  07/17/2016   Family History  Problem Relation Age of Onset  . Thyroid disease Mother   . Hypertension Brother   . Colon cancer Maternal Aunt [id number]   Social History   Tobacco Use  . Smoking status: Current Every Day Smoker    Packs/day: 0.10    Types: Cigarettes  . Smokeless tobacco: Never Used  Substance Use Topics  . Alcohol use: Yes    Comment: Occasional  . Drug use: No    Current Outpatient Medications:  .  guaiFENesin-codeine (ROBITUSSIN AC) 100-10 MG/5ML syrup, Take 5 mLs by mouth 3 (three) times daily as needed for cough. (Patient not taking: Reported on 09/15/2017), Disp: 120 mL, Rfl: 0 .  HYDROcodone-acetaminophen (NORCO/VICODIN) 5-325 MG tablet, Take 1 tablet by mouth every 4 (four) hours as needed for moderate pain. (Patient not taking: Reported on 09/15/2017), Disp: 20 tablet, Rfl: 0 .  ibuprofen (ADVIL,MOTRIN) 600 MG tablet, Take 1 tablet (600 mg total) by  mouth every 8 (eight) hours as needed. (Patient not taking: Reported on 09/15/2017), Disp: 30 tablet, Rfl: 0 .  ondansetron (ZOFRAN ODT) 8 MG disintegrating tablet, Take 1 tablet (8 mg total) by mouth every 8 (eight) hours as needed for nausea or vomiting. (Patient not taking: Reported on 09/15/2017), Disp: 20 tablet, Rfl: 0 Allergies: Patient has no known allergies.  Review of Systems  Constitutional: Positive for malaise/fatigue. Negative for chills and fever.  HENT: Negative for congestion, sinus pain and sore throat.   Eyes: Negative for blurred vision and pain.  Respiratory: Negative for cough and wheezing.   Cardiovascular: Negative for chest pain and leg swelling.  Gastrointestinal: Positive for nausea. Negative for abdominal pain, constipation, diarrhea, heartburn and vomiting.  Genitourinary: Positive for frequency. Negative for dysuria, hematuria and urgency.  Musculoskeletal: Negative for back pain, joint pain, myalgias and neck pain.  Skin: Negative for itching and rash.  Neurological: Positive for headaches. Negative for dizziness, tremors and weakness.  Endo/Heme/Allergies: Does not bruise/bleed easily.  Psychiatric/Behavioral: Negative for depression. The patient is not nervous/anxious and does not have insomnia.     Objective: BP 110/70 (BP Location: Right Arm, Patient Position: Sitting, Cuff Size: Normal)   Pulse 100   Ht 5\' 3"  (1.6 m)   Wt 164 lb (74.4 kg)   LMP 08/18/2017   BMI 29.05 kg/m   Filed Weights   09/15/17 1437  Weight: 164 lb (74.4  kg)   Body mass index is 29.05 kg/m. Physical Exam  Constitutional: She is oriented to person, place, and time. She appears well-developed and well-nourished. No distress.  Genitourinary: Rectum normal, vagina normal and uterus normal. Pelvic exam was performed with patient supine. There is no rash or lesion on the right labia. There is no rash or lesion on the left labia. Vagina exhibits no lesion. No bleeding in the vagina.  Right adnexum does not display mass and does not display tenderness. Left adnexum does not display mass and does not display tenderness. Cervix does not exhibit motion tenderness, lesion, friability or polyp.   Uterus is mobile and midaxial. Uterus is not enlarged or exhibiting a mass.  Genitourinary Comments: No mass or fibroid  HENT:  Head: Normocephalic and atraumatic. Head is without laceration.  Right Ear: Hearing normal.  Left Ear: Hearing normal.  Nose: No epistaxis.  No foreign bodies.  Mouth/Throat: Uvula is midline, oropharynx is clear and moist and mucous membranes are normal.  Eyes: Pupils are equal, round, and reactive to light.  Neck: Normal range of motion. Neck supple. No thyromegaly present.  Cardiovascular: Normal rate and regular rhythm. Exam reveals no gallop and no friction rub.  No murmur heard. Pulmonary/Chest: Effort normal and breath sounds normal. No respiratory distress. She has no wheezes. Right breast exhibits no mass, no skin change and no tenderness. Left breast exhibits no mass, no skin change and no tenderness.  Abdominal: Soft. Bowel sounds are normal. She exhibits no distension. There is no tenderness. There is no rebound.  Musculoskeletal: Normal range of motion.  Neurological: She is alert and oriented to person, place, and time. No cranial nerve deficit.  Skin: Skin is warm and dry.  Psychiatric: She has a normal mood and affect. Judgment normal.  Vitals reviewed.   Assessment:  ANNUAL EXAM 1. Irregular intermenstrual bleeding   2. Pelvic pain   3. STD exposure   4. Screening for cervical cancer   5. Screening for thyroid disorder   6. Screening for diabetes mellitus      Screening Plan:            1.  Cervical Screening-  Pap smear done today  2. Breast screening- Exam annually and mammogram>40 planned   3. Colonoscopy every 10 years, Hemoccult testing - after age 5  4. Labs Ordered today  5. Counseling for contraception: no  method  Other:  1. Irregular intermenstrual bleeding - POCT urine pregnancy - US PELVIS TRANSVANGINAL NON-OB (TV ONLY); Future - Options discussed for ovulation prediction (OPK) and stimulation, such as with Metformin (SE in past but willing to try again) and /or OCP for 3 mos then cycle off, vs REI referral again for more aggressive tx  2. Pelvic pain - US PELVIS TRANSVANGINAL NON-OB (TV ONLY); Future - Consider surgery for endometriosis eval, for pain and infertility possibilities, although pain is more dysmenorrhea and less endometriosis-like.  3. STD exposure - HEP, RPR, HIV Panel - HSV(herpes smplx)abs-1+2(IgG+IgM)-bld - IGP,CtNgTv,rfx Aptima HPV ASCU  4. Screening for cervical cancer - IGP,CtNgTv,rfx Aptima HPV ASCU  5. Screening for thyroid disorder - TSH  6. Screening for diabetes mellitus - Hemoglobin A1c      F/U  Return in about 1 week (around 09/22/2017) for Follow up after GYN [place].  Annamarie Major, MD, [facility], [facility] 09/15/2017  3:41 PM

## 2017-09-18 ENCOUNTER — Telehealth: Payer: Self-pay

## 2017-09-18 NOTE — Telephone Encounter (Signed)
LMVM TRC. F/U w/pt to see if she was seen anywhere or if she was still in need of assistance.

## 2017-09-18 NOTE — Telephone Encounter (Signed)
Weekend Triage Call: pt reports she was seen 09/15/17 & had pap smear. She is bleeding/cramping. Rates 7/10 on pain scale. Pt was advised to be seen in 3-4 hours by ER or Urgent care since our office wasn't open. No records of pt being seen in Epic.

## 2017-09-19 NOTE — Telephone Encounter (Signed)
Spoke w/pt. Advised bleeding is likely her period according to LMP she would have been due for her cycle this past weekend. Pt's pain is better. She will monitor for heavy bleeding & call if she has any further questions/concerns.

## 2017-09-19 NOTE — Telephone Encounter (Signed)
Patient returning call, she did not follow up at ER, didn't have anyone to keep her kids.  She states she is still having some pain off and on and still bleeding but not sure if this might be her cycle that has started.  Please return call.

## 2017-09-20 LAB — HSV(HERPES SMPLX)ABS-I+II(IGG+IGM)-BLD
HSV 1 Glycoprotein G Ab, IgG: <0.91 index (ref 0.00–0.90)
HSV 2 IgG, Type Spec: <0.91 index (ref 0.00–0.90)
HSVI/II Comb IgM: 0.98 Ratio — ABNORMAL HIGH (ref 0.00–0.90)

## 2017-09-20 LAB — HEP, RPR, HIV PANEL
HEP B S AG: NEGATIVE
HIV Screen 4th Generation wRfx: NONREACTIVE
RPR: NONREACTIVE

## 2017-09-20 LAB — TSH: TSH: 0.664 u[IU]/mL (ref 0.450–4.500)

## 2017-09-20 LAB — HEMOGLOBIN A1C
Est. average glucose Bld gHb Est-mCnc: 108 mg/dL
Hgb A1c MFr Bld: 5.4 % (ref 4.8–5.6)

## 2017-09-21 LAB — IGP,CTNGTV,RFX APTIMA HPV ASCU
Chlamydia, Nuc. Acid Amp: NEGATIVE
GONOCOCCUS, NUC. ACID AMP: NEGATIVE
PAP Smear Comment: 0
TRICH VAG BY NAA: NEGATIVE

## 2017-09-21 NOTE — Progress Notes (Signed)
Discuss at appt tomorrow (6/21)

## 2017-09-22 ENCOUNTER — Ambulatory Visit (INDEPENDENT_AMBULATORY_CARE_PROVIDER_SITE_OTHER): Payer: 59 | Admitting: Obstetrics & Gynecology

## 2017-09-22 ENCOUNTER — Ambulatory Visit (INDEPENDENT_AMBULATORY_CARE_PROVIDER_SITE_OTHER): Payer: 59

## 2017-09-22 ENCOUNTER — Encounter: Payer: Self-pay | Admitting: Obstetrics & Gynecology

## 2017-09-22 VITALS — BP 120/80 | Ht 63.0 in | Wt 166.0 lb

## 2017-09-22 DIAGNOSIS — B009 Herpesviral infection, unspecified: Secondary | ICD-10-CM

## 2017-09-22 DIAGNOSIS — R87612 Low grade squamous intraepithelial lesion on cytologic smear of cervix (LGSIL): Secondary | ICD-10-CM | POA: Insufficient documentation

## 2017-09-22 DIAGNOSIS — N946 Dysmenorrhea, unspecified: Secondary | ICD-10-CM

## 2017-09-22 DIAGNOSIS — Z1159 Encounter for screening for other viral diseases: Secondary | ICD-10-CM | POA: Diagnosis not present

## 2017-09-22 DIAGNOSIS — R102 Pelvic and perineal pain: Secondary | ICD-10-CM

## 2017-09-22 DIAGNOSIS — N921 Excessive and frequent menstruation with irregular cycle: Secondary | ICD-10-CM

## 2017-09-22 MED ORDER — NORETHIN ACE-ETH ESTRAD-FE 1-20 MG-MCG(24) PO CAPS: 1.0000 | ORAL_CAPSULE | Freq: Every day | ORAL | 11 refills | Status: DC

## 2017-09-22 NOTE — Progress Notes (Signed)
  HPI: Pt has irreg and painful periods, here for US.  Also concerned about fertility. Also, recent PAP abnormal and labs abnormal  Ultrasound demonstrates no masses seen, adenomyosis, no cysts These findings are Pelvis abnormal adenomyosis but no PCOS findings  PMHx: She  has a past medical history of Dysmenorrhea. Also,  has a past surgical history that includes Wisdom tooth extraction (07/17/2016)., family history includes Colon cancer (age of onset: 6052) in her maternal aunt; Hypertension in her brother; Thyroid disease in her mother.,  reports that she has been smoking cigarettes.  She has been smoking about 0.10 packs per day. She has never used smokeless tobacco. She reports that she drinks alcohol. She reports that she does not use drugs.  She has a current medication list which includes the following prescription(s): guaifenesin-codeine, hydrocodone-acetaminophen, ibuprofen, norethin ace-eth estrad-fe, and ondansetron. Also, has No Known Allergies.  Review of Systems  All other systems reviewed and are negative.  Objective: BP 120/80   Ht 5\' 3"  (1.6 m)   Wt 166 lb (75.3 kg)   BMI 29.41 kg/m   Physical examination Constitutional NAD, Conversant  Skin No rashes, lesions or ulceration.   Extremities: Moves all appropriately.  Normal ROM for age. No lymphadenopathy.  Neuro: Grossly intact  Psych: Oriented to PPT.  Normal mood. Normal affect.   Assessment:  LGSIL on Pap smear of cervix    Colpo soon.  Discussed. PAP, HPV, Cancer risks. 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  Needs retesting in 3-4 mos. Dysmenorrhea and Irregular intermenstrual bleeding    OCP for 3 mos then come off    See if improved during that time    Plan fertility afterwards  A total of 15 minutes were spent face-to-face with the patient during this encounter and over half of that time dealt with counseling and  coordination of care.  Annamarie MajorPaul Cordney Barstow, MD, Merlinda FrederickFACOG [facility], [facility] 09/22/2017  4:53 PM

## 2017-09-22 NOTE — Patient Instructions (Signed)
Cervical Dysplasia Cervical dysplasia is a condition in which a woman's cervix cells have abnormal changes. The cervix is the opening of the uterus (womb). It is located between the vagina and the uterus. Cervical dysplasia may be an early sign of cervical cancer. If left untreated, this condition may become more severe and may progress to cervical cancer. Early detection, treatment, and follow-up care are very important. What are the causes? Cervical dysplasia can be caused by a human papillomavirus (HPV) infection. HPV is the most common sexually transmitted infection (STI). HPV is spread from person to person through sexual contact. This includes oral, vaginal, or anal sex. What increases the risk? The following factors may make you more likely to develop this condition:  Having had a sexually transmitted infection (STI), such as herpes, chlamydia or HPV.  Becoming sexually active before age 18.  Having had more than one sexual partner.  Having a sexual partner who has multiple sexual partners.  Not using protection, such as a condom, during sex, especially with new sexual partners.  Having a history of cancer of the vagina or vulva.  Having a weakened body defense (immune) system.  Being the daughter of a woman who took diethylstilbestrol (DES) during pregnancy.  Having a family history of cervical cancer.  Smoking.  Using oral contraceptives, also called birth control pills.  Having had three or more full-term pregnancies.  What are the signs or symptoms? There are usually no symptoms of this condition. If you do have symptoms, they may include:  Abnormal vaginal discharge.  Bleeding between periods or after sex.  Bleeding during menopause.  Pain during sex (dyspareunia).  How is this diagnosed? A test called a Pap test may be done. During this test, cells are taken from the cervix and then examined under a microscope. A test in which tissue is removed from the cervix  (biopsy) may also be done if the Pap test is abnormal or if the cervix looks abnormal. How is this treated? Treatment varies based on the severity of the condition. Treatment may include:  Cryotherapy. During cryotherapy, the abnormal cells are frozen with a steel-tip instrument.  Loop electrosurgical excision procedure (LEEP). This procedure removes abnormal tissue from the cervix.  Surgery to remove abnormal tissue. This is usually done in more advanced cases. Surgical options include: ? A cone biopsy. This is a procedure in which the cervical canal and a portion of the center of the cervix are removed. ? Hysterectomy. This is a surgery in which the uterus and cervix are removed.  Follow these instructions at home:  Take over-the-counter and prescription medicines only as told by your health care provider.  Do not use tampons, have sex, or douche until your health care provider says it is okay.  Keep follow-up visits as told by your health care provider. This is important. Women who have been treated for cervical dysplasia should have regular pelvic exams and Pap tests as told by their health care provider. How is this prevented?  Practice safe sex to help prevent sexually transmitted infections (STI) that may cause this condition.  Have regular Pap tests. Talk with your health care provider about how often you need these tests. Pap tests will help identify cell changes that can lead to cancer. Contact a health care provider if:  You develop genital warts. Get help right away if:  You have a fever.  You have abnormal vaginal discharge.  Your menstrual period is heavier than normal.  You develop bright   red bleeding. This may include blood clots.  You have pain or cramps that get worse, and medicine does not help to relieve your pain.  You feel light-headed and you are unusually weak.  You have fainting spells.  You have pain in the abdomen. Summary  Cervical dysplasia  is a condition in which a woman's cervix cells have abnormal changes.  If left untreated, this condition may become more severe and may progress to cervical cancer.  Early detection, treatment, and follow-up care are very important in managing this condition.  Have regular pelvic exams and Pap tests. Talk with your health care provider about how often you need these tests. Pap tests will help identify cell changes that can lead to cancer. This information is not intended to replace advice given to you by your health care provider. Make sure you discuss any questions you have with your health care provider. Document Released: 03/21/2005 Document Revised: 03/24/2016 Document Reviewed: 03/24/2016 Elsevier Interactive Patient Education  2017 [employer]vier Inc[employer]  

## 2017-10-06 ENCOUNTER — Telehealth: Payer: Self-pay | Admitting: Obstetrics & Gynecology

## 2017-10-06 NOTE — Telephone Encounter (Signed)
Left detailed msg 'no prep for procedure'.

## 2017-10-06 NOTE — Telephone Encounter (Signed)
Patient has an appointment for colposcopy on 10/13/17.  Is there anything she needs to do before the procedure or not do before the procedure.

## 2017-10-13 ENCOUNTER — Other Ambulatory Visit (HOSPITAL_COMMUNITY)
Admission: RE | Admit: 2017-10-13 | Discharge: 2017-10-13 | Disposition: A | Discharge status: Home / Self Care | Payer: 59 | Source: Ambulatory Visit | Attending: Obstetrics & Gynecology | Admitting: Obstetrics & Gynecology

## 2017-10-13 ENCOUNTER — Encounter: Payer: Self-pay | Admitting: Obstetrics & Gynecology

## 2017-10-13 ENCOUNTER — Ambulatory Visit (INDEPENDENT_AMBULATORY_CARE_PROVIDER_SITE_OTHER): Payer: 59 | Admitting: Obstetrics & Gynecology

## 2017-10-13 VITALS — BP 120/70 | HR 97 | Ht 63.0 in | Wt 165.0 lb

## 2017-10-13 DIAGNOSIS — R87612 Low grade squamous intraepithelial lesion on cytologic smear of cervix (LGSIL): Secondary | ICD-10-CM | POA: Insufficient documentation

## 2017-10-13 NOTE — Progress Notes (Signed)
Referring Provider:  self  HPI:  Carol Carter is a 27 y.o.  G0P0000  who presents today for evaluation and management of abnormal cervical cytology.    Dysplasia History:  LGSIL  ROS:  Pertinent items noted in HPI and remainder of comprehensive ROS otherwise negative.  OB History  Gravida Para Term Preterm AB Living  0 0 0 0 0 0  SAB TAB Ectopic Multiple Live Births  0 0 0 0 0    Past Medical History:  Diagnosis Date  . Dysmenorrhea     Past Surgical History:  Procedure Laterality Date  . WISDOM TOOTH EXTRACTION  07/17/2016    SOCIAL HISTORY: Social History   Substance and Sexual Activity  Alcohol Use Yes   Comment: Occasional   Social History   Substance and Sexual Activity  Drug Use No     Family History  Problem Relation Age of Onset  . Thyroid disease Mother   . Hypertension Brother   . Colon cancer Maternal Aunt 52    ALLERGIES:  Patient has no known allergies.  Current Outpatient Medications on File Prior to Visit  Medication Sig Dispense Refill  . Norethin Ace-Eth Estrad-FE (TAYTULLA) 1-20 MG-MCG(24) CAPS Take 1 tablet by mouth daily. 28 capsule 11  . guaiFENesin-codeine (ROBITUSSIN AC) 100-10 MG/5ML syrup Take 5 mLs by mouth 3 (three) times daily as needed for cough. (Patient not taking: Reported on 09/15/2017) 120 mL 0  . HYDROcodone-acetaminophen (NORCO/VICODIN) 5-325 MG tablet Take 1 tablet by mouth every 4 (four) hours as needed for moderate pain. (Patient not taking: Reported on 09/15/2017) 20 tablet 0  . ibuprofen (ADVIL,MOTRIN) 600 MG tablet Take 1 tablet (600 mg total) by mouth every 8 (eight) hours as needed. (Patient not taking: Reported on 09/15/2017) 30 tablet 0  . ondansetron (ZOFRAN ODT) 8 MG disintegrating tablet Take 1 tablet (8 mg total) by mouth every 8 (eight) hours as needed for nausea or vomiting. (Patient not taking: Reported on 09/15/2017) 20 tablet 0   No current facility-administered medications on file prior to visit.      Physical Exam: -Vitals:  BP 120/70   Pulse 97   Ht 5\' 3"  (1.6 m)   Wt 165 lb (74.8 kg)   BMI 29.23 kg/m  GEN: WD, WN, NAD.  A+ O x 3, good mood and affect. ABD:  NT, ND.  Soft, no masses.  No hernias noted.   Pelvic:   Vulva: Normal appearance.  No lesions.  Vagina: No lesions or abnormalities noted.  Support: Normal pelvic support.  Urethra No masses tenderness or scarring.  Meatus Normal size without lesions or prolapse.  Cervix: See below.  Anus: Normal exam.  No lesions.  Perineum: Normal exam.  No lesions.        Bimanual   Uterus: Normal size.  Non-tender.  Mobile.  AV.  Adnexae: No masses.  Non-tender to palpation.  Cul-de-sac: Negative for abnormality.   PROCEDURE: 1.  Urine Pregnancy Test:  not done 2.  Colposcopy performed with 4% acetic acid after verbal consent obtained                                         -Aceto-white Lesions Location(s): none.              -Biopsy performed at 6, 12 o'clock               -  ECC indicated and performed: Yes.       -Biopsy sites made hemostatic with pressure, AgNO3, and/or Monsel's solution   -Satisfactory colposcopy: Yes.      -Evidence of Invasive cervical CA :  NO  ASSESSMENT:  Carol Carter is a 27 y.o. G0P0000 here for  1. LGSIL on Pap smear of cervix   .  PLAN: 1.  I discussed the grading system of pap smears and HPV high risk viral types.  We will discuss and base management after colpo results return. 2. Follow up PAP 6 months, vs intervention if high grade dysplasia identified 3. Treatment of persistantly abnormal PAP smears and cervical dysplasia, even mild, is discussed w pt today in detail, as well as the pros and cons of Cryo and LEEP procedures. Will consider and discuss after results.      Annamarie Major, MD, [facility], [facility] 10/13/2017  3:09 PM

## 2017-10-13 NOTE — Patient Instructions (Signed)

## 2017-10-16 NOTE — Progress Notes (Signed)
Colpo/Bx normal    PAP LGSIL D/w pt.  Plan PAP 6 mos. Annamarie MajorPaul Ryanne Morand, MD, Merlinda FrederickFACOG [facility], [facility] 10/16/2017  1:39 PM

## 2018-04-16 ENCOUNTER — Other Ambulatory Visit (HOSPITAL_COMMUNITY)
Admission: RE | Admit: 2018-04-16 | Discharge: 2018-04-16 | Disposition: A | Discharge status: Home / Self Care | Payer: 59 | Source: Ambulatory Visit | Attending: Obstetrics & Gynecology | Admitting: Obstetrics & Gynecology

## 2018-04-16 ENCOUNTER — Ambulatory Visit (INDEPENDENT_AMBULATORY_CARE_PROVIDER_SITE_OTHER): Payer: BLUE CROSS/BLUE SHIELD | Admitting: Obstetrics & Gynecology

## 2018-04-16 ENCOUNTER — Encounter: Payer: Self-pay | Admitting: Obstetrics & Gynecology

## 2018-04-16 ENCOUNTER — Ambulatory Visit: Payer: 59 | Admitting: Obstetrics & Gynecology

## 2018-04-16 VITALS — BP 118/74 | Ht 63.0 in | Wt 166.0 lb

## 2018-04-16 DIAGNOSIS — Z1159 Encounter for screening for other viral diseases: Secondary | ICD-10-CM | POA: Diagnosis not present

## 2018-04-16 DIAGNOSIS — Z01419 Encounter for gynecological examination (general) (routine) without abnormal findings: Secondary | ICD-10-CM

## 2018-04-16 DIAGNOSIS — B009 Herpesviral infection, unspecified: Secondary | ICD-10-CM

## 2018-04-16 DIAGNOSIS — R87612 Low grade squamous intraepithelial lesion on cytologic smear of cervix (LGSIL): Secondary | ICD-10-CM | POA: Insufficient documentation

## 2018-04-16 NOTE — Patient Instructions (Signed)
PAP today and every 6 months until 2 or more normal, then yearly Lab recheck today

## 2018-04-16 NOTE — Progress Notes (Addendum)
HPI:      [name] is a 28 y.o. G0P0000 who LMP was Patient's last menstrual period was 04/09/2018., she presents today for her annual examination. The patient has no complaints today. The patient is sexually active. Her last pap: approximate date 09/2017 and was abnormal: LGSIL (biopsies normal) and last mammogram: patient has never had a mammogram. The patient does perform self breast exams.  There is no notable family history of breast or ovarian cancer in her family.  The patient has regular exercise: yes.  The patient denies current symptoms of depression.  No sx's of HSV lesions or pain.  GYN History: Contraception: none  PMHx: Past Medical History:  Diagnosis Date  . Dysmenorrhea    Past Surgical History:  Procedure Laterality Date  . WISDOM TOOTH EXTRACTION  07/17/2016   Family History  Problem Relation Age of Onset  . Thyroid disease Mother   . Hypertension Brother   . Colon cancer Maternal Aunt 30   Social History   Tobacco Use  . Smoking status: Current Every Day Smoker    Packs/day: 0.10    Types: Cigarettes  . Smokeless tobacco: Never Used  Substance Use Topics  . Alcohol use: Yes    Comment: Occasional  . Drug use: No    Current Outpatient Medications:  .  Norethin Ace-Eth Estrad-FE (TAYTULLA) 1-20 MG-MCG(24) CAPS, Take 1 tablet by mouth daily. (Patient not taking: Reported on 04/16/2018), Disp: 28 capsule, Rfl: 11 Allergies: Patient has no known allergies.  Review of Systems  Constitutional: Negative for chills, fever and malaise/fatigue.  HENT: Negative for congestion, sinus pain and sore throat.   Eyes: Negative for blurred vision and pain.  Respiratory: Negative for cough and wheezing.   Cardiovascular: Negative for chest pain and leg swelling.  Gastrointestinal: Negative for abdominal pain, constipation, diarrhea, heartburn, nausea and vomiting.  Genitourinary: Negative for dysuria, frequency, hematuria and urgency.  Musculoskeletal:  Negative for back pain, joint pain, myalgias and neck pain.  Skin: Negative for itching and rash.  Neurological: Negative for dizziness, tremors and weakness.  Endo/Heme/Allergies: Does not bruise/bleed easily.  Psychiatric/Behavioral: Negative for depression. The patient is not nervous/anxious and does not have insomnia.     Objective: BP 118/74   Ht 5\' 3"  (1.6 m)   Wt 166 lb (75.3 kg)   LMP 04/09/2018   BMI 29.41 kg/m   Filed Weights   04/16/18 1029  Weight: 166 lb (75.3 kg)   Body mass index is 29.41 kg/m. Physical Exam Constitutional:      General: She is not in acute distress.    Appearance: She is well-developed.  Genitourinary:     Pelvic exam was performed with patient supine.     Vagina, uterus and rectum normal.     No lesions in the vagina.     No vaginal bleeding.     No cervical motion tenderness, friability, lesion or polyp.     Uterus is mobile.     Uterus is not enlarged.     No uterine mass detected.    Uterus is midaxial.     No right or left adnexal mass present.     Right adnexa not tender.     Left adnexa not tender.  HENT:     Head: Normocephalic and atraumatic. No laceration.     Right Ear: Hearing normal.     Left Ear: Hearing normal.     Mouth/Throat:     Pharynx: Uvula midline.  Eyes:  Pupils: Pupils are equal, round, and reactive to light.  Neck:     Musculoskeletal: Normal range of motion and neck supple.     Thyroid: No thyromegaly.  Cardiovascular:     Rate and Rhythm: Normal rate and regular rhythm.     Heart sounds: No murmur. No friction rub. No gallop.   Pulmonary:     Effort: Pulmonary effort is normal. No respiratory distress.     Breath sounds: Normal breath sounds. No wheezing.  Chest:     Breasts:        Right: No mass, skin change or tenderness.        Left: No mass, skin change or tenderness.  Abdominal:     General: Bowel sounds are normal. There is no distension.     Palpations: Abdomen is soft.     Tenderness:  There is no abdominal tenderness. There is no rebound.  Musculoskeletal: Normal range of motion.  Neurological:     Mental Status: She is alert and oriented to person, place, and time.     Cranial Nerves: No cranial nerve deficit.  Skin:    General: Skin is warm and dry.  Psychiatric:        Judgment: Judgment normal.  Vitals signs reviewed.     Assessment:  ANNUAL EXAM 1. Women's annual routine gynecological examination   2. LGSIL on Pap smear of cervix   3. 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  Cervical Screening-  Pap smear done today Follow up to LGSIL  2. Breast screening- Exam annually and mammogram>40 planned   3.  Labs Ordered today  4. Counseling for contraception: no method, desiresa for pregnancy soon   5. 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  Annamarie MajorPaul Makhiya Coburn, MD, Merlinda FrederickFACOG [facility], [facility] 04/16/2018  10:45 AM

## 2018-04-17 LAB — CYTOLOGY - PAP: DIAGNOSIS: NEGATIVE

## 2018-04-19 ENCOUNTER — Telehealth: Payer: Self-pay | Admitting: Obstetrics & Gynecology

## 2018-04-19 LAB — HSV(HERPES SMPLX)ABS-I+II(IGG+IGM)-BLD: HSV 1 Glycoprotein G Ab, IgG: <0.91 index (ref 0.00–0.90)

## 2018-04-19 NOTE — Telephone Encounter (Signed)
Called and left voice mail for patient to call back to be schedule °

## 2018-04-19 NOTE — Progress Notes (Signed)
Change plan to appt Jan 2021 annual

## 2018-04-19 NOTE — Progress Notes (Signed)
Results negative for HSV LM

## 2018-04-20 NOTE — Telephone Encounter (Signed)
Pt called after hour nurse 04/19/2018 at 7:54AM stating she had missed a call.  She is at work and the doctor can leave a msg if she can't answer. [phone]

## 2018-06-25 ENCOUNTER — Other Ambulatory Visit: Payer: Self-pay

## 2018-06-25 ENCOUNTER — Encounter (HOSPITAL_COMMUNITY): Payer: Self-pay

## 2018-06-25 ENCOUNTER — Ambulatory Visit (HOSPITAL_COMMUNITY)
Admission: EM | Admit: 2018-06-25 | Discharge: 2018-06-25 | Disposition: A | Discharge status: Home / Self Care | Payer: BLUE CROSS/BLUE SHIELD | Attending: Family Medicine | Admitting: Family Medicine

## 2018-06-25 ENCOUNTER — Ambulatory Visit (INDEPENDENT_AMBULATORY_CARE_PROVIDER_SITE_OTHER): Payer: BLUE CROSS/BLUE SHIELD

## 2018-06-25 DIAGNOSIS — S61212A Laceration without foreign body of right middle finger without damage to nail, initial encounter: Secondary | ICD-10-CM

## 2018-06-25 DIAGNOSIS — S6991XA Unspecified injury of right wrist, hand and finger(s), initial encounter: Secondary | ICD-10-CM

## 2018-06-25 DIAGNOSIS — M79641 Pain in right hand: Secondary | ICD-10-CM | POA: Diagnosis not present

## 2018-06-25 NOTE — ED Notes (Signed)
Patient verbalizes understanding of discharge instructions. Opportunity for questioning and answers were provided. Patient discharged from [facility] by APP.  

## 2018-06-25 NOTE — Discharge Instructions (Signed)
Your x-ray was negative for any fractures We have placed 2 stitches in the hand that you can come back in 7 to 10 days to have removed Keep area clean and dry Please watch for signs of infection and return if these occur
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
## 2018-06-26 DIAGNOSIS — S61212A Laceration without foreign body of right middle finger without damage to nail, initial encounter: Secondary | ICD-10-CM | POA: Diagnosis not present

## 2018-07-03 ENCOUNTER — Ambulatory Visit (HOSPITAL_COMMUNITY)
Admission: EM | Admit: 2018-07-03 | Discharge: 2018-07-03 | Disposition: A | Discharge status: Home / Self Care | Payer: BLUE CROSS/BLUE SHIELD

## 2018-07-03 DIAGNOSIS — Z4802 Encounter for removal of sutures: Secondary | ICD-10-CM

## 2018-07-03 NOTE — ED Notes (Signed)
Pt here for suture removal of left hand. Two sutures removed. Wound well healed.

## 2018-09-10 ENCOUNTER — Telehealth: Payer: Self-pay

## 2018-09-10 NOTE — Telephone Encounter (Signed)
Should pt just wait and see how the next period is?

## 2018-09-10 NOTE — Telephone Encounter (Signed)
Pt calling; wondering about cycle.  She didn't have one last month; now bleeding lightly off and on.  Is this normal?  Should she be concerned? Does she need to be seen? [phone]  Negative home preg test; no bc; period before last month was 7d and very light.  Pt is also concerned about cancer.  Please advise.

## 2018-09-11 NOTE — Telephone Encounter (Signed)
Called and left voice mail for patient to call back to be schedule °

## 2018-09-11 NOTE — Telephone Encounter (Signed)
Patient is schedule 09/17/18 with [facility]

## 2018-09-17 ENCOUNTER — Encounter: Payer: Self-pay | Admitting: Obstetrics & Gynecology

## 2018-09-17 ENCOUNTER — Ambulatory Visit (INDEPENDENT_AMBULATORY_CARE_PROVIDER_SITE_OTHER): Payer: BC Managed Care – PPO | Admitting: Obstetrics & Gynecology

## 2018-09-17 ENCOUNTER — Other Ambulatory Visit: Payer: Self-pay

## 2018-09-17 VITALS — BP 120/80 | Ht 63.0 in | Wt 169.0 lb

## 2018-09-17 DIAGNOSIS — N926 Irregular menstruation, unspecified: Secondary | ICD-10-CM | POA: Diagnosis not present

## 2018-09-17 NOTE — Progress Notes (Signed)
  Dysfunctional Uterine Bleeding Patient complains of irregular menses.  She has monthly cycles, but this month was delayed and then only lasted for 3 days with different flow.  Concerned about cancer and pregnancy although home preg test neg. Dysmenorrhea:mild, occurring throughout menses. Cyclic symptoms include: none. Current contraception: none. History of infertility: no. History of abnormal Pap smear: yes - LGSIL in past, last PAP 04/2018.   PMHx: She  has a past medical history of Dysmenorrhea. Also,  has a past surgical history that includes Wisdom tooth extraction (07/17/2016)., family history includes Colon cancer (age of onset: 52) in her maternal aunt; Hypertension in her brother; Thyroid disease in her mother.,  reports that she has been smoking cigarettes. She has been smoking about 0.10 packs per day. She has never used smokeless tobacco. She reports current alcohol use. She reports that she does not use drugs.  She currently has no medications in their medication list. Also, has No Known Allergies.  Review of Systems  Constitutional: Negative for chills, fever and malaise/fatigue.  HENT: Negative for congestion, sinus pain and sore throat.   Eyes: Negative for blurred vision and pain.  Respiratory: Negative for cough and wheezing.   Cardiovascular: Negative for chest pain and leg swelling.  Gastrointestinal: Negative for abdominal pain, constipation, diarrhea, heartburn, nausea and vomiting.  Genitourinary: Negative for dysuria, frequency, hematuria and urgency.  Musculoskeletal: Negative for back pain, joint pain, myalgias and neck pain.  Skin: Negative for itching and rash.  Neurological: Negative for dizziness, tremors and weakness.  Endo/Heme/Allergies: Does not bruise/bleed easily.  Psychiatric/Behavioral: Negative for depression. The patient is not nervous/anxious and does not have insomnia.     Objective: BP 120/80   Ht 5\' 3"  (1.6 m)   Wt 169 lb (76.7 kg)   LMP  09/06/2018   BMI 29.94 kg/m  Physical Exam Constitutional:      General: She is not in acute distress.    Appearance: She is well-developed.  Musculoskeletal: Normal range of motion.  Neurological:     Mental Status: She is alert and oriented to person, place, and time.  Skin:    General: Skin is warm and dry.  Vitals signs reviewed.    ASSESSMENT/PLAN:  1. Irregular menses - Beta hCG quant (ref lab)   Patient has abnormal uterine bleeding . She has a normal exam today, with no evidence of lesions.  Evaluation includes the following: exam, labs such as hormonal testing, and pelvic ultrasound to evaluate for any structural gynecologic abnormalities.  Patient to follow up after testing.  Consider [place] if continues w abn menstruation.  Not trying but also not preventing pregnancy at this time.  A total of 15 minutes were spent face-to-face with the patient during this encounter and over half of that time dealt with counseling and coordination of care.  Barnett Applebaum, MD, [facility], [facility] 09/17/2018  10:01 AM

## 2018-09-18 LAB — BETA HCG QUANT (REF LAB): hCG Quant: <1 m[IU]/mL

## 2018-10-29 ENCOUNTER — Encounter: Payer: Self-pay | Admitting: Obstetrics & Gynecology

## 2018-10-30 ENCOUNTER — Telehealth: Payer: Self-pay | Admitting: Obstetrics & Gynecology

## 2018-10-30 ENCOUNTER — Other Ambulatory Visit: Payer: Self-pay | Admitting: Obstetrics & Gynecology

## 2018-10-30 DIAGNOSIS — N926 Irregular menstruation, unspecified: Secondary | ICD-10-CM

## 2018-10-30 NOTE — Telephone Encounter (Signed)
Patient is schedule 11/09/18 at 12:00 for ultrasound and 1:50 with [facility]. Please order ultrasound. Thank you!

## 2018-10-30 NOTE — Telephone Encounter (Signed)
Sch G[facility] [place] and f/u PH w labs (so not late in day)

## 2018-10-30 NOTE — Telephone Encounter (Signed)
Patient is schedule 11/08/18 with [facility]

## 2018-11-08 DIAGNOSIS — Z03818 Encounter for observation for suspected exposure to other biological agents ruled out: Secondary | ICD-10-CM | POA: Diagnosis not present

## 2018-11-09 ENCOUNTER — Other Ambulatory Visit: Payer: BC Managed Care – PPO

## 2018-11-09 ENCOUNTER — Ambulatory Visit: Payer: BC Managed Care – PPO | Admitting: Obstetrics & Gynecology

## 2018-11-22 ENCOUNTER — Other Ambulatory Visit: Payer: Self-pay

## 2018-11-22 ENCOUNTER — Ambulatory Visit (INDEPENDENT_AMBULATORY_CARE_PROVIDER_SITE_OTHER): Payer: BC Managed Care – PPO | Admitting: Obstetrics & Gynecology

## 2018-11-22 ENCOUNTER — Encounter: Payer: Self-pay | Admitting: Obstetrics & Gynecology

## 2018-11-22 ENCOUNTER — Ambulatory Visit (INDEPENDENT_AMBULATORY_CARE_PROVIDER_SITE_OTHER): Payer: BC Managed Care – PPO

## 2018-11-22 VITALS — BP 120/80 | Ht 63.0 in | Wt 165.0 lb

## 2018-11-22 DIAGNOSIS — N83202 Unspecified ovarian cyst, left side: Secondary | ICD-10-CM

## 2018-11-22 DIAGNOSIS — N926 Irregular menstruation, unspecified: Secondary | ICD-10-CM

## 2018-11-22 DIAGNOSIS — N946 Dysmenorrhea, unspecified: Secondary | ICD-10-CM

## 2018-11-22 MED ORDER — NORETHIN ACE-ETH ESTRAD-FE 1-20 MG-MCG PO TABS: 1.0000 | ORAL_TABLET | Freq: Every day | ORAL | 11 refills | Status: DC

## 2018-11-22 NOTE — Progress Notes (Signed)
HPI: Pt has been having irreg cycles that are discomforting to her, with pain and crampiness.  Does not desire pregnancy at this time, maybe later.  Ultrasound demonstrates no masses seen, cyst seen small simple cyst See below  PMHx: She  has a past medical history of Dysmenorrhea. Also,  has a past surgical history that includes Wisdom tooth extraction (07/17/2016)., family history includes Colon cancer (age of onset: 44) in her maternal aunt; Hypertension in her brother; Thyroid disease in her mother.,  reports that she has been smoking cigarettes. She has been smoking about 0.10 packs per day. She has never used smokeless tobacco. She reports current alcohol use. She reports that she does not use drugs.  She has a current medication list which includes the following prescription(s): norethindrone-ethinyl estradiol. Also, has No Known Allergies.  Review of Systems  All other systems reviewed and are negative.   Objective: BP 120/80    Ht 5\' 3"  (1.6 m)    Wt 165 lb (74.8 kg)    LMP 09/06/2018    BMI 29.23 kg/m   Physical examination Constitutional NAD, Conversant  Skin No rashes, lesions or ulceration.   Extremities: Moves all appropriately.  Normal ROM for age. No lymphadenopathy.  Neuro: Grossly intact  Psych: Oriented to PPT.  Normal mood. Normal affect.   US Pelvic Complete With Transvaginal  Result Date: 11/22/2018 Patient Name: Carol Carter DOB: 1990/07/25 MRN: [id number] ULTRASOUND REPORT Location: [facility] Date of Service: 11/22/2018 Indications: amenorrhea Findings: The uterus is anteverted and measures 6.6 x 4.8 x 3.7 cm. Echo texture is homogenous without evidence of focal masses. The Endometrium measures 5.6 mm. Right Ovary measures 4.6 x 3.5 x 2.2 cm. It is normal in appearance. Left Ovary measures 3.4 x 3.6 x 3.2 cm. It is not normal in appearance. There is a complex cyst in the left ovary with anechoic fluid and thin septations. No blood flow is seen within. The  cyst measures 23 x 18 x 26 mm. Survey of the adnexa demonstrates no adnexal masses. There is no free fluid in the cul de sac. Impression: 1. Normal appearing uterus and cervix. 2. Normal right ovary. 3. There is a small 2.3 cm complex cyst in the left ovary. Recommendations: 1.Clinical correlation with the patient's History and Physical Exam. Gweneth Dimitri, RT Review of ULTRASOUND.    I have personally reviewed images and report of recent ultrasound done at [facility].    Plan of management to be discussed with patient. Barnett Applebaum, MD, [facility], [facility] 11/22/2018  8:58 AM   Assessment:  Irregular menses - Plan: norethindrone-ethinyl estradiol (JUNEL FE 1/20) 1-20 MG-MCG tablet  Dysmenorrhea - Plan: norethindrone-ethinyl estradiol (JUNEL FE 1/20) 1-20 MG-MCG tablet  Hormonal control, wtart w OCPs per pt request. OCPs The risks /benefits of OCPs have been explained to the patient in detail.  Product literature has been given to her.  I have instructed her in the use of OCPs and have given her literature reinforcing this information.  I have explained to the patient that OCPs are not as effective for birth control during the first month of use, and that another form of contraception should be used during this time.  Both first-day start and Sunday start have been explained.  The risks and benefits of each was discussed.  She has been made aware of  the fact that other medications may affect the efficacy of OCPs.  I have answered all of her questions,  and I believe that she has an understanding of the effectiveness and use of OCPs.  A total of 15 minutes were spent face-to-face with the patient during this encounter and over half of that time dealt with counseling and coordination of care.   Carol MajorPaul Dejha King, MD, Merlinda FrederickFACOG [facility], [facility] 11/22/2018  9:07 AM

## 2018-11-22 NOTE — Patient Instructions (Signed)
Ethinyl Estradiol; Norethindrone Acetate; Ferrous fumarate tablets or capsules What is this medicine? ETHINYL ESTRADIOL; NORETHINDRONE ACETATE; FERROUS FUMARATE (ETH in il es tra DYE ole; nor eth IN drone AS e tate; FER us FUE ma rate) is an oral contraceptive. The products combine two types of female hormones, an estrogen and a progestin. They are used to prevent ovulation and pregnancy. Some products are also used to treat acne in females. This medicine may be used for other purposes; ask your health care provider or pharmacist if you have questions. COMMON BRAND NAME(S): Aurovela 24 Fe 1/20, Aurovela Fe, Blisovi 24 Fe, Blisovi Fe, Estrostep Fe, Gildess 24 Fe, Gildess Fe 1.5/30, Gildess Fe 1/20, Hailey 24 Fe, Junel Fe 1.5/30, Junel Fe 1/20, Junel Fe 24, Larin Fe, Lo Loestrin Fe, Loestrin 24 Fe, Loestrin FE 1.5/30, Loestrin FE 1/20, Lomedia 24 Fe, Microgestin 24 Fe, Microgestin Fe 1.5/30, Microgestin Fe 1/20, Tarina 24 Fe, Tarina Fe 1/20, Taytulla, Tilia Fe, Tri-Legest Fe What should I tell my health care provider before I take this medicine? They need to know if you have any of these conditions:  abnormal vaginal bleeding  blood vessel disease  breast, cervical, endometrial, ovarian, liver, or uterine cancer  diabetes  gallbladder disease  heart disease or recent heart attack  high blood pressure  high cholesterol  history of blood clots  kidney disease  liver disease  migraine headaches  smoke tobacco  stroke  systemic lupus erythematosus (SLE)  an unusual or allergic reaction to estrogens, progestins, other medicines, foods, dyes, or preservatives  pregnant or trying to get pregnant  breast-feeding How should I use this medicine? Take this medicine by mouth. To reduce nausea, this medicine may be taken with food. Follow the directions on the prescription label. Take this medicine at the same time each day and in the order directed on the package. Do not take your  medicine more often than directed. A patient package insert for the product will be given with each prescription and refill. Read this sheet carefully each time. The sheet may change frequently. Contact your pediatrician regarding the use of this medicine in children. Special care may be needed. This medicine has been used in female children who have started having menstrual periods. Overdosage: If you think you have taken too much of this medicine contact a poison control center or emergency room at once. NOTE: This medicine is only for you. Do not share this medicine with others. What if I miss a dose? If you miss a dose, refer to the patient information sheet you received with your medicine for direction. If you miss more than one pill, this medicine may not be as effective and you may need to use another form of birth control. What may interact with this medicine? Do not take this medicine with the following medication:  dasabuvir; ombitasvir; paritaprevir; ritonavir  ombitasvir; paritaprevir; ritonavir This medicine may also interact with the following medications:  acetaminophen  antibiotics or medicines for infections, especially rifampin, rifabutin, rifapentine, and griseofulvin, and possibly penicillins or tetracyclines  aprepitant  ascorbic acid (vitamin C)  atorvastatin  barbiturate medicines, such as phenobarbital  bosentan  carbamazepine  caffeine  clofibrate  cyclosporine  dantrolene  doxercalciferol  felbamate  grapefruit juice  hydrocortisone  medicines for anxiety or sleeping problems, such as diazepam or temazepam  medicines for diabetes, including pioglitazone  mineral oil  modafinil  mycophenolate  nefazodone  oxcarbazepine  phenytoin  prednisolone  ritonavir or other medicines for HIV infection or   AIDS  rosuvastatin  selegiline  soy isoflavones supplements  St. John's wort  tamoxifen or  raloxifene  theophylline  thyroid hormones  topiramate  warfarin This list may not describe all possible interactions. Give your health care provider a list of all the medicines, herbs, non-prescription drugs, or dietary supplements you use. Also tell them if you smoke, drink alcohol, or use illegal drugs. Some items may interact with your medicine. What should I watch for while using this medicine? Visit your doctor or health care professional for regular checks on your progress. You will need a regular breast and pelvic exam and Pap smear while on this medicine. Use an additional method of contraception during the first cycle that you take these tablets. If you have any reason to think you are pregnant, stop taking this medicine right away and contact your doctor or health care professional. If you are taking this medicine for hormone related problems, it may take several cycles of use to see improvement in your condition. Smoking increases the risk of getting a blood clot or having a stroke while you are taking birth control pills, especially if you are more than 28 years old. You are strongly advised not to smoke. This medicine can make your body retain fluid, making your fingers, hands, or ankles swell. Your blood pressure can go up. Contact your doctor or health care professional if you feel you are retaining fluid. This medicine can make you more sensitive to the sun. Keep out of the sun. If you cannot avoid being in the sun, wear protective clothing and use sunscreen. Do not use sun lamps or tanning beds/booths. If you wear contact lenses and notice visual changes, or if the lenses begin to feel uncomfortable, consult your eye care specialist. In some women, tenderness, swelling, or minor bleeding of the gums may occur. Notify your dentist if this happens. Brushing and flossing your teeth regularly may help limit this. See your dentist regularly and inform your dentist of the medicines you  are taking. If you are going to have elective surgery, you may need to stop taking this medicine before the surgery. Consult your health care professional for advice. This medicine does not protect you against HIV infection (AIDS) or any other sexually transmitted diseases. What side effects may I notice from receiving this medicine? Side effects that you should report to your doctor or health care professional as soon as possible:  allergic reactions like skin rash, itching or hives, swelling of the face, lips, or tongue  breast tissue changes or discharge  changes in vaginal bleeding during your period or between your periods  changes in vision  chest pain  confusion  coughing up blood  dizziness  feeling faint or lightheaded  headaches or migraines  leg, arm or groin pain  loss of balance or coordination  severe or sudden headaches  stomach pain (severe)  sudden shortness of breath  sudden numbness or weakness of the face, arm or leg  symptoms of vaginal infection like itching, irritation or unusual discharge  tenderness in the upper abdomen  trouble speaking or understanding  vomiting  yellowing of the eyes or skin Side effects that usually do not require medical attention (report to your doctor or health care professional if they continue or are bothersome):  breakthrough bleeding and spotting that continues beyond the 3 initial cycles of pills  breast tenderness  mood changes, anxiety, depression, frustration, anger, or emotional outbursts  increased sensitivity to sun or ultraviolet light    nausea  skin rash, acne, or brown spots on the skin  weight gain (slight) This list may not describe all possible side effects. Call your doctor for medical advice about side effects. You may report side effects to FDA at 1-800-FDA-1088. Where should I keep my medicine? Keep out of the reach of children. Store at room temperature between 15 and 30 degrees C  (59 and 86 degrees F). Throw away any unused medicine after the expiration date. NOTE: This sheet is a summary. It may not cover all possible information. If you have questions about this medicine, talk to your doctor, pharmacist, or health care provider.  2020 Elsevier/Gold Standard (2015-11-30 08:04:41)  

## 2019-03-07 ENCOUNTER — Encounter: Payer: Self-pay | Admitting: Obstetrics & Gynecology

## 2019-03-08 NOTE — Telephone Encounter (Signed)
Please advise 

## 2019-03-11 NOTE — Telephone Encounter (Signed)
Agree with your message to her, wait it out this month and see

## 2019-04-02 NOTE — Telephone Encounter (Signed)
Please advise 

## 2019-05-06 ENCOUNTER — Other Ambulatory Visit: Payer: Self-pay | Admitting: Obstetrics & Gynecology

## 2019-05-06 ENCOUNTER — Other Ambulatory Visit: Payer: BC Managed Care – PPO

## 2019-05-06 ENCOUNTER — Ambulatory Visit: Payer: BC Managed Care – PPO | Admitting: Obstetrics & Gynecology

## 2019-05-06 DIAGNOSIS — R102 Pelvic and perineal pain: Secondary | ICD-10-CM

## 2019-05-06 NOTE — Telephone Encounter (Signed)
Patient is schedule for Monday, 05/20/19 for GYN ultrasound. Please place order thank you!

## 2019-05-20 ENCOUNTER — Ambulatory Visit (INDEPENDENT_AMBULATORY_CARE_PROVIDER_SITE_OTHER): Payer: BC Managed Care – PPO

## 2019-05-20 ENCOUNTER — Ambulatory Visit (INDEPENDENT_AMBULATORY_CARE_PROVIDER_SITE_OTHER): Payer: BC Managed Care – PPO | Admitting: Obstetrics & Gynecology

## 2019-05-20 ENCOUNTER — Encounter: Payer: Self-pay | Admitting: Obstetrics & Gynecology

## 2019-05-20 ENCOUNTER — Other Ambulatory Visit: Payer: Self-pay

## 2019-05-20 ENCOUNTER — Other Ambulatory Visit: Payer: Self-pay | Admitting: Obstetrics & Gynecology

## 2019-05-20 VITALS — BP 120/80 | Ht 63.0 in | Wt 169.0 lb

## 2019-05-20 DIAGNOSIS — R102 Pelvic and perineal pain: Secondary | ICD-10-CM

## 2019-05-20 DIAGNOSIS — R1032 Left lower quadrant pain: Secondary | ICD-10-CM | POA: Diagnosis not present

## 2019-05-20 NOTE — Progress Notes (Signed)
  HPI: Abdominal Pain Patient presents for evaluation of abdominal pain. The pain is described as sharp and shooting, and is 7/10 in intensity. Pain is located in the LLQ and deep pelvis area without radiation. Onset was insidious occurring 2 weeks ago. Symptoms have been unchanged since. Aggravating factors: none. Alleviating factors: none. Associated symptoms: no change in bowel or bladder habits, no change in periods. The patient denies chills, constipation, diarrhea, dysuria, fever, frequency, headache, nausea and vomiting. Risk factors for pelvic/abdominal pain include prior ovarian cyst rupture.  Ultrasound demonstrates no masses seen, no cyst, no FF  PMHx: She  has a past medical history of Dysmenorrhea. Also,  has a past surgical history that includes Wisdom tooth extraction (07/17/2016)., family history includes Colon cancer (age of onset: 46) in her maternal aunt; Hypertension in her brother; Thyroid disease in her mother.,  reports that she has been smoking cigarettes. She has been smoking about 0.10 packs per day. She has never used smokeless tobacco. She reports current alcohol use. She reports that she does not use drugs.  She has a current medication list which includes the following prescription(s): norethindrone-ethinyl estradiol. Also, has No Known Allergies.  Review of Systems  All other systems reviewed and are negative.   Objective: BP 120/80   Ht 5\' 3"  (1.6 m)   Wt 169 lb (76.7 kg)   LMP 04/08/2019   BMI 29.94 kg/m   Physical examination Constitutional NAD, Conversant  Skin No rashes, lesions or ulceration.   Extremities: Moves all appropriately.  Normal ROM for age. No lymphadenopathy.  Neuro: Grossly intact  Psych: Oriented to PPT.  Normal mood. Normal affect.   06/06/2019 PELVIS TRANSVAGINAL NON-OB (TV ONLY)  Result Date: 05/20/2019 Patient Name: Carol Carter DOB: 10/26/1990 MRN: 1990 ULTRASOUND REPORT Location: [facility] Date of Service: 05/20/2019  Indications:Pelvic Pain Findings: The uterus is anteverted and measures 7.2 x 4.7 x 3.6 cm. Echo texture is heterogenous without evidence of focal masses. The Endometrium measures 5.6 mm. Normal appearance. Right Ovary measures 4.7 x 3.2 x 2.6 cm. It is normal in appearance. Left Ovary measures 3.3 x 1.8 x 2.0 cm. It is normal in appearance. Survey of the adnexa demonstrates no adnexal masses. There is no free fluid in the cul de sac. Impression: 1. Normal pelvic ultrasound. Recommendations: 1.Clinical correlation with the patient's History and Physical Exam. 05/22/2019, RT Review of ULTRASOUND.    I have personally reviewed images and report of recent ultrasound done at [facility].    Plan of management to be discussed with patient. [facility], MD, FACOG [facility], [facility] 05/20/2019  4:19 PM  UA + heme  Assessment:  Pelvic pain  LLQ pain - Plan: Urine Culture    No sign of ovarian or Gyn etiology    Assess for urine infection    If neg, then urology referral fro hematuria and pain  PAP/Annual 1 month  A total of 20 minutes were spent face-to-face with the patient as well as preparation, review, communication, and documentation during this encounter.   05/22/2019, MD, [facility], [facility] 05/20/2019  4:27 PM

## 2019-05-22 ENCOUNTER — Other Ambulatory Visit: Payer: Self-pay | Admitting: Obstetrics & Gynecology

## 2019-05-22 ENCOUNTER — Telehealth: Payer: Self-pay | Admitting: Obstetrics & Gynecology

## 2019-05-22 DIAGNOSIS — R102 Pelvic and perineal pain: Secondary | ICD-10-CM

## 2019-05-22 DIAGNOSIS — R319 Hematuria, unspecified: Secondary | ICD-10-CM

## 2019-05-22 LAB — URINE CULTURE

## 2019-05-22 NOTE — Telephone Encounter (Signed)
Please call pt with results. Thank you

## 2019-05-22 NOTE — Telephone Encounter (Signed)
Pt aware.

## 2019-05-22 NOTE — Telephone Encounter (Signed)
Negative urine culture (normal).  Will consult Urology for her pain and hematuria

## 2019-05-22 NOTE — Telephone Encounter (Signed)
The results are normal correct?

## 2019-06-17 ENCOUNTER — Other Ambulatory Visit: Payer: Self-pay

## 2019-06-17 ENCOUNTER — Ambulatory Visit (INDEPENDENT_AMBULATORY_CARE_PROVIDER_SITE_OTHER): Payer: BC Managed Care – PPO | Admitting: Obstetrics & Gynecology

## 2019-06-17 ENCOUNTER — Encounter: Payer: Self-pay | Admitting: Obstetrics & Gynecology

## 2019-06-17 ENCOUNTER — Other Ambulatory Visit (HOSPITAL_COMMUNITY)
Admission: RE | Admit: 2019-06-17 | Discharge: 2019-06-17 | Disposition: A | Discharge status: Home / Self Care | Payer: BLUE CROSS/BLUE SHIELD | Source: Ambulatory Visit | Attending: Obstetrics & Gynecology | Admitting: Obstetrics & Gynecology

## 2019-06-17 VITALS — BP 120/70 | Ht 63.0 in | Wt 164.0 lb

## 2019-06-17 DIAGNOSIS — Z124 Encounter for screening for malignant neoplasm of cervix: Secondary | ICD-10-CM | POA: Diagnosis not present

## 2019-06-17 DIAGNOSIS — Z01419 Encounter for gynecological examination (general) (routine) without abnormal findings: Secondary | ICD-10-CM | POA: Diagnosis not present

## 2019-06-17 MED ORDER — DROSPIRENONE-ETHINYL ESTRADIOL 3-0.02 MG PO TABS: 1.0000 | ORAL_TABLET | Freq: Every day | ORAL | 3 refills | Status: DC

## 2019-06-17 NOTE — Progress Notes (Signed)
316/21 10:44 AM   Carol Carter [birth year] [id number]  Referring provider: Nadara Mustard, MD [address] [place],  [place] [address]  Chief Complaint  Patient presents with  . Hematuria    HPI: Carol Carter is a 29 yo [place] American F who presents today for the evaluation and management of pelvic pain and hematuria. She was referred to [place] by Nadara Mustard, MD.   She had pelvic pain in her LLQ leading her to be seen by OB/GYN, Dr. Tiburcio Pea, who did a dip in office. Results are not available to me but per his note, it indicated positive heme and no microsopic evaluation was done. Thereafter, she was referred here.   Urine culture unremarkable from 05/20/19. 2019 was her last formal UA which indicated small blood.   Her pelvic US from 2/15 was unremarkable. No evidence of renal imagining.   She reports of random intermittent LLQ pain that radiates to back onset just over a month. She potentially attributes her pain to muscle pain due to picking her kids up.  The pain comes and goes.  No aggravating or exacerbating symptoms.  She denies gross hematuria, urinary symptoms including dysuria, constipation and diarrhea.   She reports of no ovarian cyst and is currently not on her period.   She has no personal history or family history of kidney stones.   Current smoker with 1 cigarette a day.   PMH: Past Medical History:  Diagnosis Date  . Dysmenorrhea     Surgical History: Past Surgical History:  Procedure Laterality Date  . WISDOM TOOTH EXTRACTION  07/17/2016    Home Medications:  Allergies as of 06/18/2019   No Known Allergies     Medication List       Accurate as of June 18, 2019 10:44 AM. If you have any questions, ask your nurse or doctor.        drospirenone-ethinyl estradiol 3-0.02 MG tablet Commonly known as: YAZ Take 1 tablet by mouth daily.       Allergies: No Known Allergies  Family History: Family History  Problem Relation Age  of Onset  . Thyroid disease Mother   . Hypertension Brother   . Liver disease Brother   . Colon cancer Maternal Aunt 37    Social History:  reports that she has been smoking cigarettes. She has been smoking about 0.10 packs per day. She has never used smokeless tobacco. She reports current alcohol use. She reports that she does not use drugs.   Physical Exam: BP 122/76   Pulse 67   Ht 5\' 3"  (1.6 m)   Wt 167 lb (75.8 kg)   LMP 06/05/2019 Comment: 3 days long  BMI 29.58 kg/m   Constitutional:  Alert and oriented, No acute distress. HEENT: [place] AT, moist mucus membranes.  Trachea midline, no masses. Cardiovascular: No clubbing, cyanosis, or edema. Respiratory: Normal respiratory effort, no increased work of breathing. Skin: No rashes, bruises or suspicious lesions. Neurologic: Grossly intact, no focal deficits, moving all 4 extremities. Psychiatric: Normal mood and affect.   Urinalysis UA today blood 3-10 RBC in urine.   Pertinent Imaging: Patient Name: Carol Carter DOB: Mar 01, 1991 MRN: [id number]  ULTRASOUND REPORT  Location: [facility]  Date of Service: 05/20/2019     Indications:Pelvic Pain Findings:  The uterus is anteverted and measures 7.2 x 4.7 x 3.6 cm. Echo texture is heterogenous without evidence of focal masses. The Endometrium measures 5.6 mm. Normal appearance.  Right Ovary measures  4.7 x 3.2 x 2.6 cm. It is normal in appearance. Left Ovary measures 3.3 x 1.8 x 2.0 cm. It is normal in appearance. Survey of the adnexa demonstrates no adnexal masses. There is no free fluid in the cul de sac.  Impression: 1. Normal pelvic ultrasound.   Recommendations: 1.Clinical correlation with the patient's History and Physical Exam.  Gweneth Dimitri, RT   Review of ULTRASOUND.    I have personally reviewed images and report of recent ultrasound done at [facility].    Plan of management to be discussed with patient.  Barnett Applebaum, MD,  [facility], [facility] 05/20/2019  4:19 PM  I have personally reviewed the images and agree with radiologist interpretation.   Assessment & Plan:    1. Microscopic hematuria  UA today positive for 3-10 RBC Given her age she is in the low risk category  Minimal smoking history If no stones indicated in KUB further evaluation for microscopic evaluation needed  --> if no stone is visible, consider CT to rule out stones in setting of left lower quadrant pain and microscopic blood Maybe OB/GYN related given history of abnormal bleeding  If imaging negative, would consider whether or not to pursue cystoscopy  2. Abdominal pain/LLQ Rule out stones above Bowel hygiene discussed   We will call with KUB results and follow-up plan  [facility] [address], [place], [place] [address] [phone]  I, Lucas Mallow, am acting as a scribe for Dr. Hollice Espy,  I have reviewed the above documentation for accuracy and completeness, and I agree with the above.   Hollice Espy, MD

## 2019-06-17 NOTE — Progress Notes (Signed)
HPI:      [name] is a 29 y.o. G0P0000 who LMP was Patient's last menstrual period was 06/05/2019., she presents today for her annual examination. The patient has no complaints today. The patient is sexually active. Her last pap: approximate date 2020 and was normal and prior to that she had LGSIL PAP with normal biopsies (2019). The patient does perform self breast exams.  There is no notable family history of breast or ovarian cancer in her family.  The patient has regular exercise: yes.  The patient denies current symptoms of depression.    GYN History: Contraception: OCP (estrogen/progesterone)     Irreg periods on this pill    Plans pregnancy in next 1-2 yrs  PMHx: Past Medical History:  Diagnosis Date  . Dysmenorrhea    Past Surgical History:  Procedure Laterality Date  . WISDOM TOOTH EXTRACTION  07/17/2016   Family History  Problem Relation Age of Onset  . Thyroid disease Mother   . Hypertension Brother   . Liver disease Brother   . Colon cancer Maternal Aunt 42   Social History   Tobacco Use  . Smoking status: Current Every Day Smoker    Packs/day: 0.10    Types: Cigarettes  . Smokeless tobacco: Never Used  Substance Use Topics  . Alcohol use: Yes    Comment: Occasional  . Drug use: No    Current Outpatient Medications:  .  drospirenone-ethinyl estradiol (YAZ) 3-0.02 MG tablet, Take 1 tablet by mouth daily., Disp: 3 Package, Rfl: 3 Allergies: Patient has no known allergies.  Review of Systems  Constitutional: Positive for malaise/fatigue. Negative for chills and fever.  HENT: Negative for congestion, sinus pain and sore throat.   Eyes: Negative for blurred vision and pain.  Respiratory: Negative for cough and wheezing.   Cardiovascular: Negative for chest pain and leg swelling.  Gastrointestinal: Negative for abdominal pain, constipation, diarrhea, heartburn, nausea and vomiting.  Genitourinary: Negative for dysuria, frequency, hematuria and  urgency.  Musculoskeletal: Negative for back pain, joint pain, myalgias and neck pain.  Skin: Negative for itching and rash.  Neurological: Negative for dizziness, tremors and weakness.  Endo/Heme/Allergies: Does not bruise/bleed easily.  Psychiatric/Behavioral: Negative for depression. The patient is not nervous/anxious and does not have insomnia.     Objective: BP 120/70   Ht 5\' 3"  (1.6 m)   Wt 164 lb (74.4 kg)   LMP 06/05/2019   BMI 29.05 kg/m   Filed Weights   06/17/19 1045  Weight: 164 lb (74.4 kg)   Body mass index is 29.05 kg/m. Physical Exam Constitutional:      General: She is not in acute distress.    Appearance: She is well-developed.  Genitourinary:     Pelvic exam was performed with patient supine.     Vagina, uterus and rectum normal.     No lesions in the vagina.     No vaginal bleeding.     No cervical motion tenderness, friability, lesion or polyp.     Uterus is mobile.     Uterus is not enlarged.     No uterine mass detected.    Uterus is midaxial.     No right or left adnexal mass present.     Right adnexa not tender.     Left adnexa not tender.  HENT:     Head: Normocephalic and atraumatic. No laceration.     Right Ear: Hearing normal.     Left Ear: Hearing normal.  Mouth/Throat:     Pharynx: Uvula midline.  Eyes:     Pupils: Pupils are equal, round, and reactive to light.  Neck:     Thyroid: No thyromegaly.  Cardiovascular:     Rate and Rhythm: Normal rate and regular rhythm.     Heart sounds: No murmur. No friction rub. No gallop.   Pulmonary:     Effort: Pulmonary effort is normal. No respiratory distress.     Breath sounds: Normal breath sounds. No wheezing.  Chest:     Breasts:        Right: No mass, skin change or tenderness.        Left: No mass, skin change or tenderness.  Abdominal:     General: Bowel sounds are normal. There is no distension.     Palpations: Abdomen is soft.     Tenderness: There is no abdominal tenderness.  There is no rebound.  Musculoskeletal:        General: Normal range of motion.     Cervical back: Normal range of motion and neck supple.  Neurological:     Mental Status: She is alert and oriented to person, place, and time.     Cranial Nerves: No cranial nerve deficit.  Skin:    General: Skin is warm and dry.  Psychiatric:        Judgment: Judgment normal.  Vitals reviewed.     Assessment:  ANNUAL EXAM 1. Women's annual routine gynecological examination   2. Screening for cervical cancer      Screening Plan:            1.  Cervical Screening-  Pap smear done today  2. Breast screening- Exam annually and mammogram>40 planned   3. Colonoscopy every 10 years, Hemoccult testing - after age 66; counseled no change to this protocol w current FH of colon cancer (MAunt, >50)  4. Labs managed by PCP  5. Counseling for contraception: oral contraceptives (estrogen/progesterone) Will change OCP pill brand as had irreg BTB w current Junel pills    F/U  Return in about 1 year (around 06/16/2020) for Annual.  Barnett Applebaum, MD, [facility], [facility] 06/17/2019  11:13 AM

## 2019-06-18 ENCOUNTER — Ambulatory Visit
Admission: RE | Admit: 2019-06-18 | Discharge: 2019-06-18 | Disposition: A | Discharge status: Home / Self Care | Payer: BLUE CROSS/BLUE SHIELD | Source: Ambulatory Visit | Attending: Urology | Admitting: Urology

## 2019-06-18 ENCOUNTER — Encounter: Payer: Self-pay | Admitting: Urology

## 2019-06-18 ENCOUNTER — Ambulatory Visit (INDEPENDENT_AMBULATORY_CARE_PROVIDER_SITE_OTHER): Payer: BC Managed Care – PPO | Admitting: Urology

## 2019-06-18 VITALS — BP 122/76 | HR 67 | Ht 63.0 in | Wt 167.0 lb

## 2019-06-18 DIAGNOSIS — R319 Hematuria, unspecified: Secondary | ICD-10-CM | POA: Insufficient documentation

## 2019-06-18 DIAGNOSIS — R1031 Right lower quadrant pain: Secondary | ICD-10-CM | POA: Diagnosis not present

## 2019-06-18 LAB — MICROSCOPIC EXAMINATION

## 2019-06-18 LAB — URINALYSIS, COMPLETE
Bilirubin, UA: NEGATIVE
Glucose, UA: NEGATIVE
Ketones, UA: NEGATIVE
Leukocytes,UA: NEGATIVE
Nitrite, UA: NEGATIVE
Protein,UA: NEGATIVE
Specific Gravity, UA: 1.025 (ref 1.005–1.030)
Urobilinogen, Ur: 0.2 mg/dL (ref 0.2–1.0)
pH, UA: 5 (ref 5.0–7.5)

## 2019-06-19 ENCOUNTER — Telehealth: Payer: Self-pay | Admitting: Urology

## 2019-06-19 DIAGNOSIS — R1032 Left lower quadrant pain: Secondary | ICD-10-CM

## 2019-06-19 LAB — CYTOLOGY - PAP: Diagnosis: NEGATIVE

## 2019-06-19 NOTE — Telephone Encounter (Signed)
No obvious stones are visible on KUB.  As per discussion in the office, I think you should get a CT, noncontrast at this point in time and have her follow-up thereafter with me, can be virtual visit.  Vanna Scotland, MD

## 2019-06-19 NOTE — Telephone Encounter (Signed)
Patient called the office today for the test results from her UA and KUB and to discuss next steps.    Please call the patient to discuss.

## 2019-06-20 NOTE — Telephone Encounter (Signed)
Notified patient as instructed, patient pleased. Discussed follow-up appointments, patient agrees  

## 2019-06-20 NOTE — Telephone Encounter (Signed)
Left VM to return my call

## 2019-07-04 IMAGING — US US ABDOMEN LIMITED
1 series · 14 of 25 positions shown · non-contrast
Comparison: None.

CLINICAL DATA: Right upper quadrant pain for 3 days

EXAM:
ULTRASOUND ABDOMEN LIMITED RIGHT UPPER QUADRANT

[Series 1: us abdomen limited · 0.17mm/px · 14 of 51 slices shown]
[im 1/51]
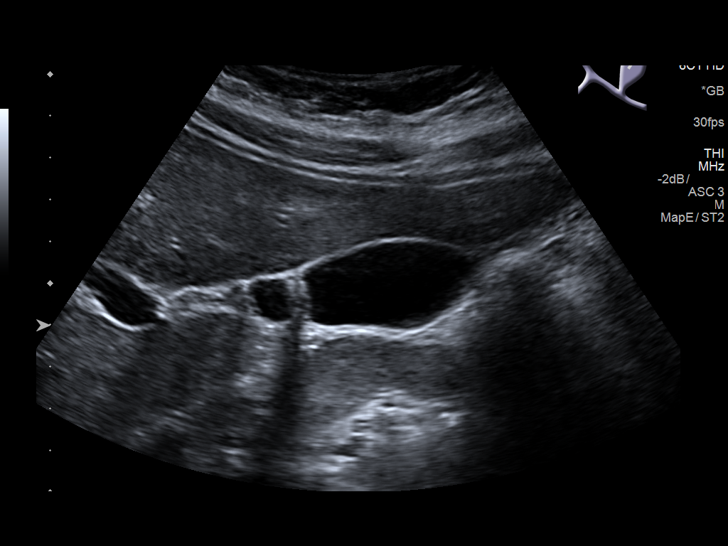
[im 5/51]
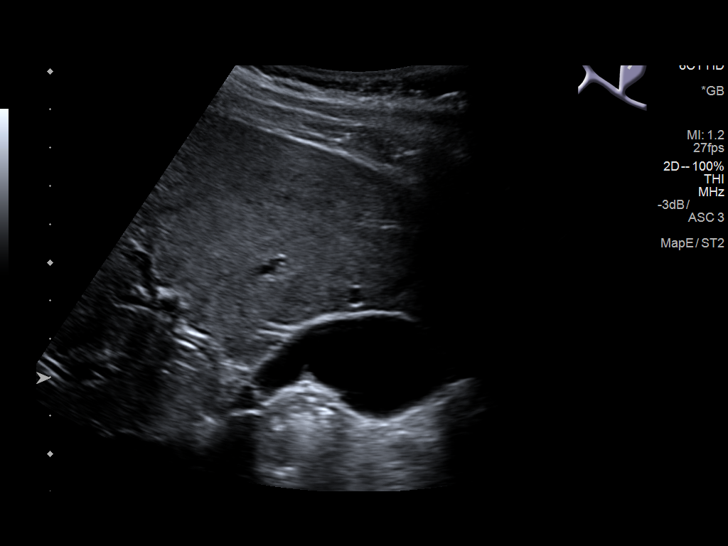
[im 9/51]
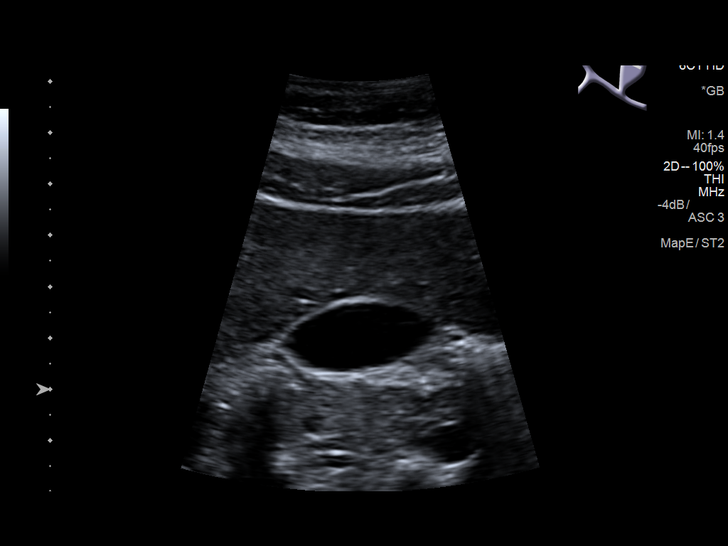
[im 13/51]
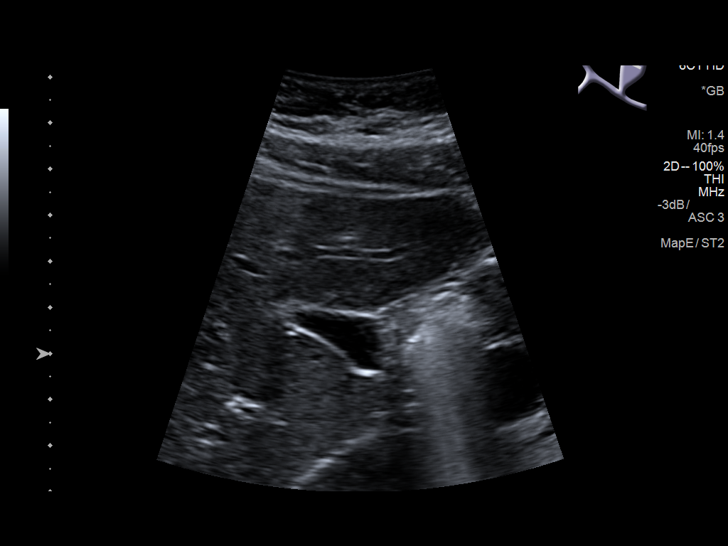
[im 17/51]
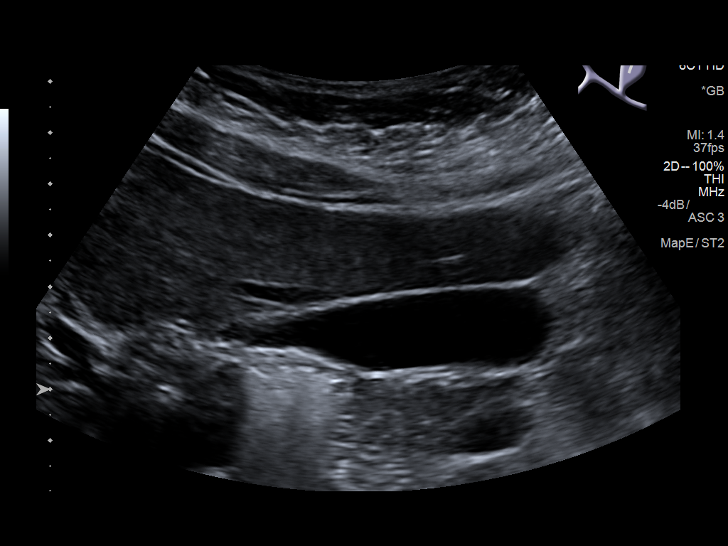
[im 19/51]
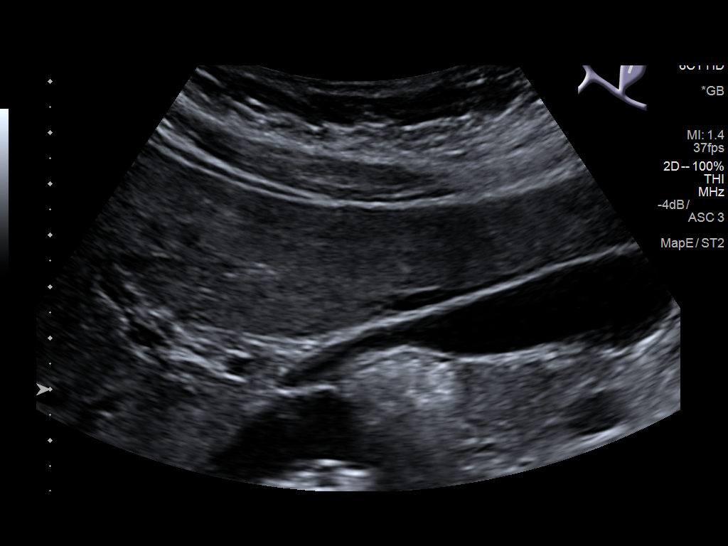
[im 23/51]
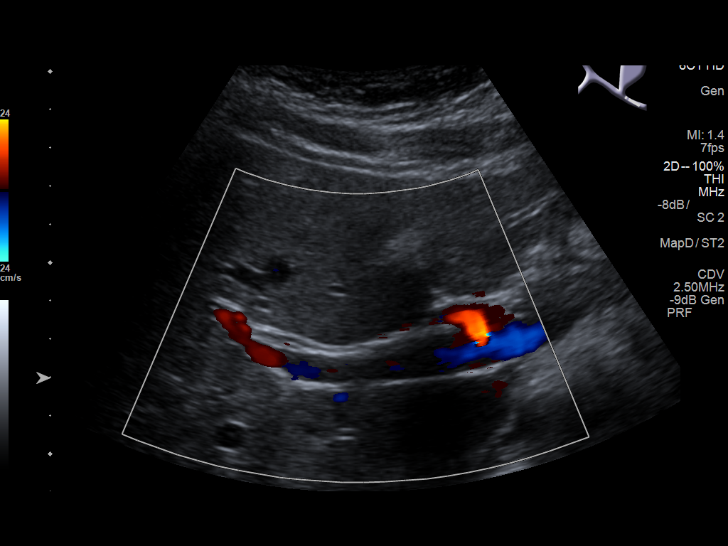
[im 28/51]
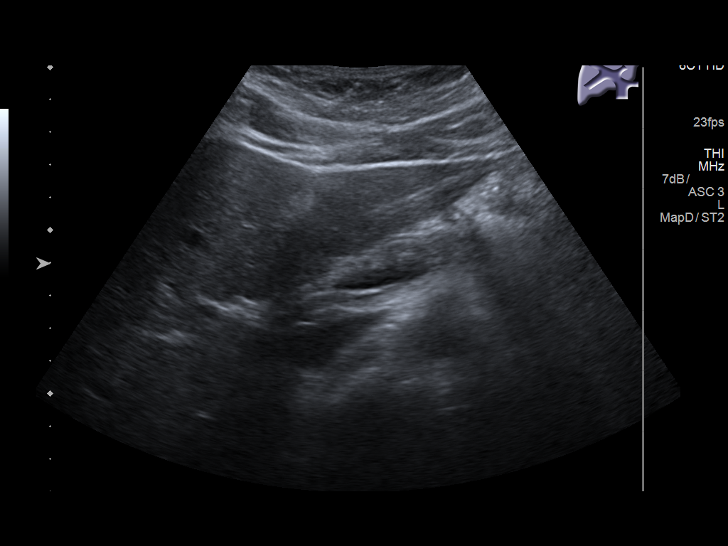
[im 32/51]
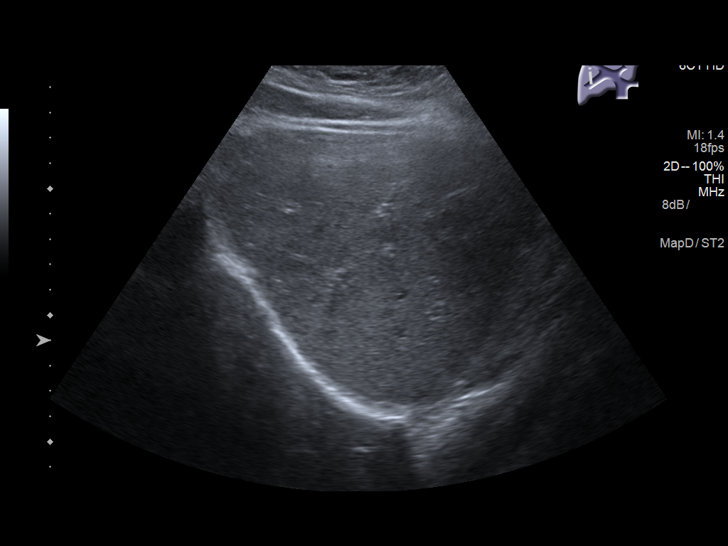
[im 34/51]
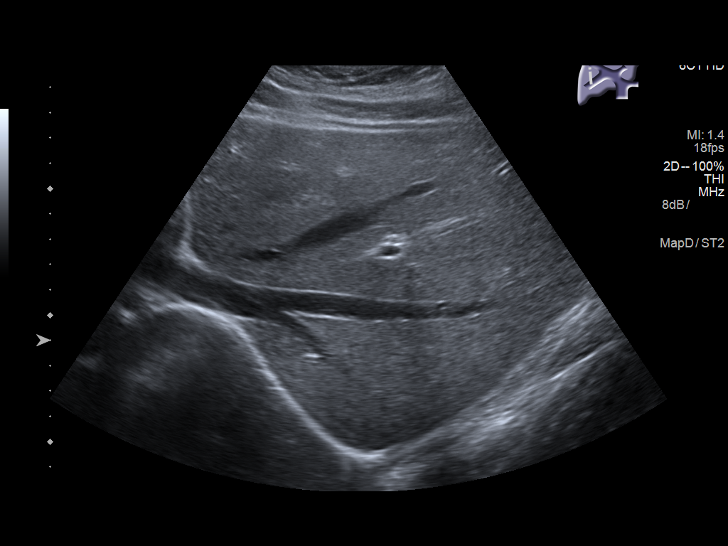
[im 38/51]
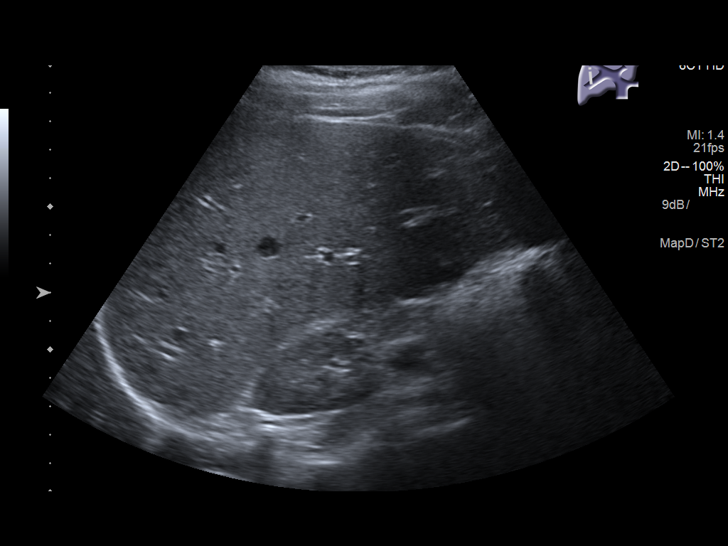
[im 42/51]
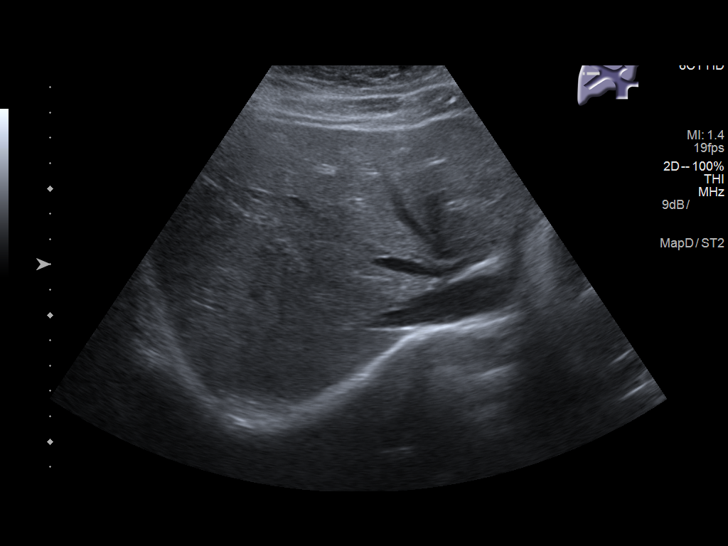
[im 46/51]
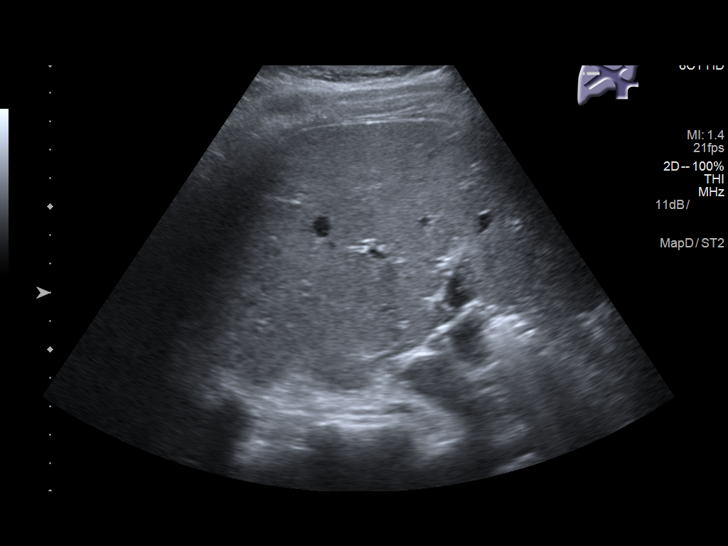
[im 51/51]
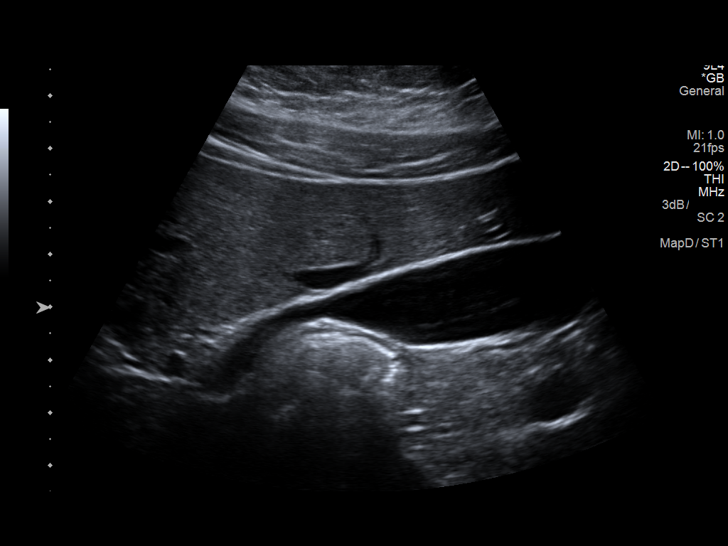

[14 of 25 positions shown; findings below may reference images not displayed]

FINDINGS: Gallbladder:

No gallstones or wall thickening visualized. No sonographic Murphy
sign noted by sonographer.

Common bile duct:

Diameter: 2 mm.

Liver:

No focal lesion identified. Within normal limits in parenchymal
echogenicity. Portal vein is patent on color Doppler imaging with
normal direction of blood flow towards the liver.
IMPRESSION: No acute abnormality noted.

## 2019-07-10 ENCOUNTER — Other Ambulatory Visit: Payer: Self-pay | Admitting: Obstetrics & Gynecology

## 2019-07-10 MED ORDER — NORGESTIM-ETH ESTRAD TRIPHASIC 0.18/0.215/0.25 MG-25 MCG PO TABS: 1.0000 | ORAL_TABLET | Freq: Every day | ORAL | 3 refills | Status: DC

## 2019-07-10 NOTE — Telephone Encounter (Signed)
Can you change the rx?

## 2019-07-19 ENCOUNTER — Ambulatory Visit
Admission: RE | Admit: 2019-07-19 | Discharge: 2019-07-19 | Disposition: A | Discharge status: Home / Self Care | Payer: BC Managed Care – PPO | Source: Ambulatory Visit | Attending: Urology | Admitting: Urology

## 2019-07-19 ENCOUNTER — Other Ambulatory Visit: Payer: Self-pay

## 2019-07-19 DIAGNOSIS — R1032 Left lower quadrant pain: Secondary | ICD-10-CM

## 2019-07-19 DIAGNOSIS — N200 Calculus of kidney: Secondary | ICD-10-CM | POA: Diagnosis not present

## 2019-07-24 ENCOUNTER — Telehealth (INDEPENDENT_AMBULATORY_CARE_PROVIDER_SITE_OTHER): Payer: BC Managed Care – PPO | Admitting: Urology

## 2019-07-24 ENCOUNTER — Other Ambulatory Visit: Payer: Self-pay

## 2019-07-24 DIAGNOSIS — R3129 Other microscopic hematuria: Secondary | ICD-10-CM | POA: Diagnosis not present

## 2019-07-24 DIAGNOSIS — R1032 Left lower quadrant pain: Secondary | ICD-10-CM

## 2019-07-24 DIAGNOSIS — N200 Calculus of kidney: Secondary | ICD-10-CM

## 2019-07-24 NOTE — Progress Notes (Signed)
Virtual Visit via Video chat Note  I connected with [name] on 07/24/19 at  4:00 PM EDT by video chat with A/V enabled and verified that I am speaking with the correct person using two identifiers.  Location: Patient: home Provider: office   I discussed the limitations, risks, security and privacy concerns of performing an evaluation and management service by telephone and the availability of in person appointments. I also discussed with the patient that there may be a patient responsible charge related to this service. The patient expressed understanding and agreed to proceed.   History of Present Illness: Carol Carter is a 29 y.o. F who returns today for the evaluation and management of pelvic pain and hematuria.   Urine culture unremarkable from 05/20/19. 2019 was her last formal UA which indicated small blood.   Her pelvic US from 2/15 was unremarkable. No evidence of renal imagining.   On last visit she reported of random intermittent LLQ pain that radiates to back onset just over a month. She potentially attributes her pain to muscle pain due to picking her kids up.  The pain comes and goes.  No aggravating or exacerbating symptoms.  UA on 06/18/19 revealed 3-10 RBC. KUB on 06/18/19 revealed no evidence of disease.   F/u CT on 07/19/19 revealed nonobstructing right renal calculus.   She has no personal history or family history of kidney stones. Large, 90 CC cystic structure is identified within the right adnexa. This has a maximum dimension of 6 cm.  Current smoker with 1 cigarette a day.    Observations/Objective: Pt is engaged and asking good questions.   Pertinent Imagings:  CLINICAL DATA:  Right lower quadrant pain for 1 month.  EXAM: ABDOMEN - 1 VIEW  COMPARISON:  None.  FINDINGS: Normal bowel gas pattern.  No evidence of renal or ureteral stones.  Normal soft tissues.  Skeletal structures are within normal limits.  Clear lung  bases.  IMPRESSION: Negative.   Electronically Signed   By: Lajean Manes M.D.   On: 06/18/2019 13:41   CLINICAL DATA:  Flank pain. Evaluate for kidney stone.  EXAM: CT ABDOMEN AND PELVIS WITHOUT CONTRAST  TECHNIQUE: Multidetector CT imaging of the abdomen and pelvis was performed following the standard protocol without IV contrast.  COMPARISON:  None.  FINDINGS: Lower chest: No acute abnormality.  Hepatobiliary: No focal liver abnormality identified. The gallbladder is unremarkable. No biliary dilatation.  Pancreas: Unremarkable. No pancreatic ductal dilatation or surrounding inflammatory changes.  Spleen: Normal in size without focal abnormality.  Adrenals/Urinary Tract: Normal appearance of the adrenal glands. Stone within the lower pole of the right kidney measures 3 mm, image 35/3. No left kidney stone. No hydronephrosis identified bilaterally.  Stomach/Bowel: Stomach is within normal limits. Appendix appears normal. No evidence of bowel wall thickening, distention, or inflammatory changes.  Vascular/Lymphatic: No significant vascular findings are present. No enlarged abdominal or pelvic lymph nodes.  Reproductive: The uterus appears normal. There is a large cystic structure within the right adnexa which measures 6 x 5.6 by 5.2 cm (volume = 90 cm^3).  Other: No ascites or focal fluid collections.  Musculoskeletal: No acute or significant osseous findings.  IMPRESSION: 1. Nonobstructing right renal calculus. 2. Large, 90 CC cystic structure is identified within the right adnexa. This has a maximum dimension of 6 cm. Further evaluation with pelvic sonogram is advised. This recommendation is from: Managing Incidental Findings on Abdominal and Pelvic CT and MRI: Part 1: White Paper of the  ACR Incidental Findings Committee II on Adnexal Findings. Allena Katz MD et al. Barbie Haggis 2013 Sept;10(9):675-681.   Electronically Signed   By: Signa Kell  M.D.   On: 07/19/2019 13:53  I have personally reviewed the images and agree with radiologist interpretation.  I was also able to share my screen and show her the images via video chat.  Assessment and Plan:  1. 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 Tiburcio Pea with a unremarkable pelvic ultrasound back in February  I have recommended that she follow back up with him as this is likely the source of her left lower quadrant discomfort  2. Microscopic hematuria Discussed differential diagnosis for microscopic hematuria which does include her small left lower pole stone  We discussed given her age, she is at very low risk for bladder pathology.  That being said she is a smoker.  We discussed the option using a shared decision-making construct, she would like to pursue cystoscopy.  We will arrange for this.  3. Kidney stones Small approximate 3 mm nonobstructing right lower pole stone  She is asymptomatic from this  We discussed general stone prevention techniques including drinking plenty water with goal of producing 2.5 L urine daily, increased citric acid intake, avoidance of high oxalate containing foods, and decreased salt intake.  Information about dietary recommendations given today.  Would recommend no intervention, observation with follow-up KUB in a few years to ensure that his stone enlarging   Follow Up Instructions:    I discussed the assessment and treatment plan with the patient. The patient was provided an opportunity to ask questions and all were answered. The patient agreed with the plan and demonstrated an understanding of the instructions.   The patient was advised to call back or seek an in-person evaluation if the symptoms worsen or if the condition fails to improve as anticipated.  I  provided 25 minutes of non-face-to-face time during this encounter.  [name], am acting as a scribe for Dr. Vanna Scotland,  I have reviewed the above documentation for accuracy and completeness, and I agree with the above.   Vanna Scotland, MD

## 2019-07-25 ENCOUNTER — Other Ambulatory Visit: Payer: Self-pay | Admitting: Obstetrics & Gynecology

## 2019-07-25 ENCOUNTER — Telehealth: Payer: Self-pay | Admitting: Obstetrics & Gynecology

## 2019-07-25 DIAGNOSIS — R102 Pelvic and perineal pain: Secondary | ICD-10-CM

## 2019-07-25 NOTE — Telephone Encounter (Signed)
Called and left voicemail for patient to call back to be scheduled. 

## 2019-07-25 NOTE — Telephone Encounter (Signed)
Patient is schedule for 08/08/19 due to work

## 2019-07-25 NOTE — Telephone Encounter (Signed)
-----   Message from Nadara Mustard, MD sent at 07/25/2019 11:44 AM EDT ----- Regarding: [facility] [place] and PH f/u Needs f/u ----- Message ----- From: Vanna Scotland, MD Sent: 07/24/2019   4:32 PM EDT To: Nadara Mustard, MD

## 2019-08-03 DIAGNOSIS — N83202 Unspecified ovarian cyst, left side: Secondary | ICD-10-CM

## 2019-08-03 HISTORY — DX: Unspecified ovarian cyst, left side: N83.202

## 2019-08-07 NOTE — Progress Notes (Signed)
   08/08/19  CC:  Chief Complaint  Patient presents with  . Cysto    HPI: Carol Carter is a 29 y.o. F w/ microscopic hematuria presents today for the evaluation and management of microscopic hematuria.   Urine culture unremarkable from 05/20/19. 2019 was her last formal UA which indicated small blood.   Her pelvic US from 2/15 was unremarkable. No evidence of renal imagining.    UA on 06/18/19 revealed 3-10 RBC. KUB on 06/18/19 revealed no evidence of disease.   F/u CT on 07/19/19 revealed nonobstructing right renal calculus.   Large, 90 CC cystic structure is identified within the right adnexa. This has a maximum dimension of 6 cm.  She still experiences pain in her LLQ of abdomen which she attributes to her cyst.  She is seeing her OB/GYN doctor later today to discuss options.  Current smoker with 1 cigarette a day.  Please see previous note for details.   Today's Vitals   08/08/19 0911  BP: 116/78  Pulse: 69   There is no height or weight on file to calculate BMI. NED. A&Ox3.   No respiratory distress   Abd soft, NT, ND Normal external genitalia with patent urethral meatus  Cystoscopy Procedure Note  Patient identification was confirmed, informed consent was obtained, and patient was prepped using Betadine solution.  Lidocaine jelly was administered per urethral meatus.    Procedure: - Flexible cystoscope introduced, without any difficulty.   - Thorough search of the bladder revealed:    normal urethral meatus    normal urothelium    no stones    no ulcers     no tumors    no urethral polyps    no trabeculation  - Ureteral orifices were normal in position and appearance.  Post-Procedure: - Patient tolerated the procedure well  Assessment/ Plan:  1. 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 Tiburcio Pea with a unremarkable pelvic ultrasound back in February Currently managed by Dr. Tiburcio Pea   2. Microscopic hematuria NED on cysto   3. Kidney stones Small approximate 3 mm nonobstructing right lower pole stone She is asymptomatic from this Would recommend no intervention, observation with follow-up KUB in a few years to ensure that his stone enlarging   I, Carol Carter, am acting as a scribe for Dr. Vanna Scotland,  I have reviewed the above documentation for accuracy and completeness, and I agree with the above.   Vanna Scotland, MD

## 2019-08-08 ENCOUNTER — Encounter: Payer: Self-pay | Admitting: Urology

## 2019-08-08 ENCOUNTER — Ambulatory Visit (INDEPENDENT_AMBULATORY_CARE_PROVIDER_SITE_OTHER): Payer: BC Managed Care – PPO | Admitting: Urology

## 2019-08-08 ENCOUNTER — Encounter: Payer: Self-pay | Admitting: Obstetrics & Gynecology

## 2019-08-08 ENCOUNTER — Other Ambulatory Visit: Payer: Self-pay

## 2019-08-08 ENCOUNTER — Ambulatory Visit (INDEPENDENT_AMBULATORY_CARE_PROVIDER_SITE_OTHER): Payer: BC Managed Care – PPO

## 2019-08-08 ENCOUNTER — Other Ambulatory Visit: Payer: Self-pay | Admitting: Obstetrics & Gynecology

## 2019-08-08 ENCOUNTER — Ambulatory Visit (INDEPENDENT_AMBULATORY_CARE_PROVIDER_SITE_OTHER): Payer: BC Managed Care – PPO | Admitting: Obstetrics & Gynecology

## 2019-08-08 VITALS — BP 100/60 | Ht 63.0 in | Wt 164.0 lb

## 2019-08-08 VITALS — BP 116/78 | HR 69

## 2019-08-08 DIAGNOSIS — N83202 Unspecified ovarian cyst, left side: Secondary | ICD-10-CM | POA: Diagnosis not present

## 2019-08-08 DIAGNOSIS — R3129 Other microscopic hematuria: Secondary | ICD-10-CM

## 2019-08-08 DIAGNOSIS — R1032 Left lower quadrant pain: Secondary | ICD-10-CM

## 2019-08-08 DIAGNOSIS — R102 Pelvic and perineal pain: Secondary | ICD-10-CM

## 2019-08-08 HISTORY — PX: CYSTOSCOPY: SUR368

## 2019-08-08 LAB — URINALYSIS, COMPLETE
Bilirubin, UA: NEGATIVE
Glucose, UA: NEGATIVE
Ketones, UA: NEGATIVE
Leukocytes,UA: NEGATIVE
Nitrite, UA: NEGATIVE
Protein,UA: NEGATIVE
Specific Gravity, UA: 1.025 (ref 1.005–1.030)
Urobilinogen, Ur: 0.2 mg/dL (ref 0.2–1.0)
pH, UA: 5.5 (ref 5.0–7.5)

## 2019-08-08 LAB — MICROSCOPIC EXAMINATION: Bacteria, UA: NONE SEEN

## 2019-08-08 NOTE — H&P (View-Only) (Signed)
PRE-OPERATIVE HISTORY AND PHYSICAL EXAM  HPI:  Carol Carter is a 28 y.o. G0P0000 Patient's last menstrual period was 07/19/2019.; she is being admitted for surgery related to pelvic pain and a persistent 6 cm left ovarian cyst first diagnosed by CT scan 6 weeks ago, and found to be unchanged in size on follow up ultrasound today.  Continues with frequent almost daily LLQ pain that is severe at times, no radiaiton but associated with nausea and lack of energy, has to bend over or rest to help alleviate pain, and has also been seen by urology (hematuria) with normal cystoscopy procedure and evaluation (today).  Desires surgery to remove cyst vs continued ecpectant management.Marland Kitchen  PMHx: Past Medical History:  Diagnosis Date  . Dysmenorrhea    Past Surgical History:  Procedure Laterality Date  . WISDOM TOOTH EXTRACTION  07/17/2016   Family History  Problem Relation Age of Onset  . Thyroid disease Mother   . Hypertension Brother   . Liver disease Brother   . Colon cancer Maternal Aunt 68   Social History   Tobacco Use  . Smoking status: Current Every Day Smoker    Packs/day: 0.10    Types: Cigarettes  . Smokeless tobacco: Never Used  Substance Use Topics  . Alcohol use: Yes    Comment: Occasional  . Drug use: No    Current Outpatient Medications:  .  Norgestimate-Ethinyl Estradiol Triphasic 0.18/0.215/0.25 MG-25 MCG tab, Take 1 tablet by mouth daily., Disp: 3 Package, Rfl: 3 Allergies: Patient has no known allergies.  Review of Systems  Constitutional: Positive for malaise/fatigue. Negative for chills and fever.  HENT: Negative for congestion, sinus pain and sore throat.   Eyes: Negative for blurred vision and pain.  Respiratory: Negative for cough and wheezing.   Cardiovascular: Negative for chest pain and leg swelling.  Gastrointestinal: Positive for abdominal pain and nausea. Negative for constipation, diarrhea, heartburn and vomiting.  Genitourinary: Negative for  dysuria, frequency, hematuria and urgency.  Musculoskeletal: Negative for back pain, joint pain, myalgias and neck pain.  Skin: Negative for itching and rash.  Neurological: Negative for dizziness, tremors and weakness.  Endo/Heme/Allergies: Does not bruise/bleed easily.  Psychiatric/Behavioral: Negative for depression. The patient is not nervous/anxious and does not have insomnia.     Objective: BP 100/60   Ht 5\' 3"  (1.6 m)   Wt 164 lb (74.4 kg)   LMP 07/19/2019   BMI 29.05 kg/m   Filed Weights   08/08/19 1416  Weight: 164 lb (74.4 kg)   Physical Exam Constitutional:      General: She is not in acute distress.    Appearance: She is well-developed.  HENT:     Head: Normocephalic and atraumatic. No laceration.     Right Ear: Hearing normal.     Left Ear: Hearing normal.     Mouth/Throat:     Pharynx: Uvula midline.  Eyes:     Pupils: Pupils are equal, round, and reactive to light.  Neck:     Thyroid: No thyromegaly.  Cardiovascular:     Rate and Rhythm: Normal rate and regular rhythm.     Heart sounds: No murmur. No friction rub. No gallop.   Pulmonary:     Effort: Pulmonary effort is normal. No respiratory distress.     Breath sounds: Normal breath sounds. No wheezing.  Chest:     Breasts:        Right: No mass, skin change or tenderness.  Left: No mass, skin change or tenderness.  Abdominal:     General: Bowel sounds are normal. There is no distension.     Palpations: Abdomen is soft.     Tenderness: There is generalized abdominal tenderness and tenderness in the left lower quadrant. There is no rebound.  Musculoskeletal:        General: Normal range of motion.     Cervical back: Normal range of motion and neck supple.  Neurological:     Mental Status: She is alert and oriented to person, place, and time.     Cranial Nerves: No cranial nerve deficit.  Skin:    General: Skin is warm and dry.  Psychiatric:        Judgment: Judgment normal.  Vitals  reviewed.     Assessment: 1. Left ovarian cyst   2. LLQ pain   Options discussed, plan Laparoscopy with left ovarian cystectomy Consent today Surgery next week  I have had a careful discussion with this patient about all the options available and the risk/benefits of each. I have fully informed this patient that surgery may subject her to a variety of discomforts and risks: She understands that most patients have surgery with little difficulty, but problems can happen ranging from minor to fatal. These include nausea, vomiting, pain, bleeding, infection, poor healing, hernia, or formation of adhesions. Unexpected reactions may occur from any drug or anesthetic given. Unintended injury may occur to other pelvic or abdominal structures such as Fallopian tubes, ovaries, bladder, ureter (tube from kidney to bladder), or bowel. Nerves going from the pelvis to the legs may be injured. Any such injury may require immediate or later additional surgery to correct the problem. Excessive blood loss requiring transfusion is very unlikely but possible. Dangerous blood clots may form in the legs or lungs. Physical and sexual activity will be restricted in varying degrees for an indeterminate period of time but most often 2-6 weeks.  Finally, she understands that it is impossible to list every possible undesirable effect and that the condition for which surgery is done is not always cured or significantly improved, and in rare cases may be even worse.Ample time was given to answer all questions.  Barnett Applebaum, MD, [facility], [facility] 08/08/2019  3:15 PM

## 2019-08-08 NOTE — Progress Notes (Signed)
  HPI: Abdominal Pain Patient presents for evaluation of abdominal pain. The pain is described as sharp and shooting, and is 8/10 in intensity. Pain is located in the LLQ area without radiation. Onset was sudden occurring 2 months ago. Symptoms have been unchanged since. Aggravating factors: activity. Alleviating factors: movement and recumbency. Associated symptoms: nausea. The patient denies constipation, diarrhea, fever and headache. Risk factors for pelvic/abdominal pain include cyst noted in past.  Ultrasound demonstrates cyst seen on left ovary 6 cm, see below  PMHx: She  has a past medical history of Dysmenorrhea. Also,  has a past surgical history that includes Wisdom tooth extraction (07/17/2016)., family history includes Colon cancer (age of onset: 35) in her maternal aunt; Hypertension in her brother; Liver disease in her brother; Thyroid disease in her mother.,  reports that she has been smoking cigarettes. She has been smoking about 0.10 packs per day. She has never used smokeless tobacco. She reports current alcohol use. She reports that she does not use drugs.  She has a current medication list which includes the following prescription(s): norgestimate-ethinyl estradiol triphasic. Also, has No Known Allergies.  Review of Systems  All other systems reviewed and are negative.   Objective: BP 100/60   Ht 5\' 3"  (1.6 m)   Wt 164 lb (74.4 kg)   LMP 07/19/2019   BMI 29.05 kg/m   Physical examination Constitutional NAD, Conversant  Skin No rashes, lesions or ulceration.   Extremities: Moves all appropriately.  Normal ROM for age. No lymphadenopathy.  Neuro: Grossly intact  Psych: Oriented to PPT.  Normal mood. Normal affect.   07/21/2019 PELVIS TRANSVAGINAL NON-OB (TV ONLY)  Result Date: 08/08/2019 Patient Name: Carol Carter DOB: 01-06-1991 MRN: 1990 ULTRASOUND REPORT Location: [facility] Date of Service: 08/08/2019 Indications:Pelvic Pain Findings: The uterus is anteverted  and measures 7.1 x 5.0 x 4.0 cm. Echo texture is homogenous without evidence of focal masses. The Endometrium measures 6.6 mm. Right Ovary measures 4.3 x 2.4 x 2.2 cm. It is normal in appearance. Left Ovary measures 6.7 x 5.8 x 5.9 cm. It is not normal in appearance. There is a simple cyst in the left ovary measuring 59 x 53 x 55 mm. Normal blood flow is seen within the left ovary. Survey of the adnexa demonstrates no adnexal masses. There is no free fluid in the cul de sac. Impression: 1. Normal uterus, cervix and right ovary. 2. There is a 5.9 cm simple cyst in the left ovary. Recommendations: 1.Clinical correlation with the patient's History and Physical Exam. 10/08/2019, RT Review of ULTRASOUND.    I have personally reviewed images and report of recent ultrasound done at [facility].    Plan of management to be discussed with patient. [facility], MD, [facility], [facility] 08/08/2019  3:04 PM    Assessment:  Left ovarian cyst  LLQ pain  Options discussed.  Due to persistence of pain and still 6 cm cyst on left ovary over >6 weeks time, will proceed with laparoscopy and left ovarian cystectomy.  Pros and cons of surgery and recovery discussed.  A total of 25 minutes were spent face-to-face with the patient as well as preparation, review, communication, and documentation during this encounter.   10/08/2019, MD, [facility], [facility] 08/08/2019  3:20 PM

## 2019-08-08 NOTE — Patient Instructions (Signed)
PRE ADMISSION TESTING For Covid, prior to procedure Tuesday 8:00-10:00 [facility] entrance (drive up)  Results in 07-37 hours You will not receive notification if test results are negative. If positive for Covid19, your provider will notify you by phone, with additional instructions.   Ovarian Cystectomy Ovarian cystectomy is a procedure that is done to remove a fluid-filled sac (cyst) on an ovary. The ovaries are small organs that produce eggs in women. Various types of cysts can form on the ovaries. Most are not cancerous. This procedure may be done for cysts that are large, cause symptoms, or do not go away on their own. It may also be done for a cyst that is cancerous or might be cancerous. This surgery can be done using a laparoscopic technique or an open abdominal technique. The laparoscopic technique is minimally invasive and results in smaller incisions and a faster recovery. The technique used will depend on your age, the type of cyst that you have, and whether the cyst is cancerous. The laparoscopic technique is not used for a cancerous cyst. Tell a health care provider about:  Any allergies you have.  All medicines you are taking, including vitamins, herbs, eye drops, creams, and over-the-counter medicines.  Any problems you or family members have had with anesthetic medicines.  Any blood disorders you have.  Any surgeries you have had.  Any medical conditions you have.  Whether you are pregnant or may be pregnant. What are the risks? Generally, this is a safe procedure. However, problems may occur, including:  Excessive bleeding.  Infection.  Damage to nearby structures or organs.  Allergic reactions to medicines.  Blood clots.  Inability to get pregnant (infertility). What happens before the procedure? Staying hydrated Follow instructions from your health care provider about hydration, which may include:  Up to 2 hours before the procedure - you  may continue to drink clear liquids, such as water, clear fruit juice, black coffee, and plain tea.  Eating and drinking restrictions Follow instructions from your health care provider about eating and drinking, which may include:  8 hours before the procedure - stop eating heavy meals or foods, such as meat, fried foods, or fatty foods.  6 hours before the procedure - stop eating light meals or foods, such as toast or cereal.  6 hours before the procedure - stop drinking milk or drinks that contain milk.  2 hours before the procedure - stop drinking clear liquids. Medicines Ask your health care provider about:  Changing or stopping your regular medicines. This is especially important if you are taking diabetes medicines or blood thinners.  Taking medicines such as aspirin and ibuprofen. These medicines can thin your blood. Do not take these medicines unless your health care provider tells you to take them.  Taking over-the-counter medicines, vitamins, herbs, and supplements. General instructions  Do not use any products that contain nicotine or tobacco for at least 4 weeks before the procedure. These products include cigarettes, e-cigarettes, and chewing tobacco. If you need help quitting, ask your health care provider.  Ask your health care provider: ? How your surgery site will be marked. ? What steps will be taken to help prevent infection. These may include:  Removing hair at the surgery site.  Washing skin with a germ-killing soap.  Taking antibiotic medicine.  You may be asked to shower with a germ-killing soap.  Plan to have someone take you home from the hospital or clinic.  Plan to have someone help with  household activities for 1-2 weeks after the procedure.  Let your health care provider know if you develop a cold or any infection before your surgery. What happens during the procedure?  An IV will be inserted into one of your veins.  You will be given one or  more of the following: ? A medicine to help you relax (sedative). ? A medicine to make you fall asleep (general anesthetic).  Small monitors will be attached to your body. They will be used to check your heart, blood pressure, and oxygen level.  A breathing tube will be placed into your lungs during the procedure.  Your surgeon will do the surgery using either the laparoscopic technique or the open abdominal technique. Laparoscopic technique   Several small incisions will be made in your abdomen.  Your abdomen will be filled with carbon dioxide gas to make it expand. This will give the surgeon more room to operate. It will also make your organs easier to see.  A thin scope with a camera (laparoscope) will be put through one of the small incisions. The laparoscope will send a picture to a monitor in the operating room to help the surgeon see inside your body.  Hollow tubes will be put through the other small incisions in your abdomen. The tools needed for the procedure will be put through these tubes.  The ovary with the cyst will be identified, and the cyst will be removed.  The tools will then be removed, and the incisions will be closed with stitches or skin glue. Bandages (dressings) may be applied. Open abdominal technique  A single, large incision will be made along your bikini line or in the middle of your lower abdomen.  The ovary with the cyst will be identified, and the cyst will be removed.  The incision will then be closed with stitches or staples.  Bandages (dressings) may be applied. The procedure may vary among health care providers and hospitals. What happens after the procedure?  Your blood pressure, heart rate, breathing rate, and blood oxygen level will be monitored until you leave the hospital or clinic.  Your IV will be removed after you are able to eat and drink well.  You may be given medicine for pain or to help you sleep.  You may be given an  antibiotic medicine.  Do not drive for 24 hours if you were given a sedative during the procedure.  The cyst that was removed will be sent to the lab for testing. If the cyst has cancer cells, both ovaries may need to be removed during a different surgery.  It is up to you to get the results of your procedure. Ask your health care provider, or the department that is doing the procedure, when your results will be ready. Summary  Ovarian cystectomy is a procedure that is done to remove a cyst on an ovary.  This procedure may be done for cysts that are large, cause symptoms, or do not go away on their own. It may also be done for a cyst that is cancerous or might be cancerous.  Follow instructions from your health care provider about eating and drinking before the procedure.  After the cyst is removed, it will be sent to the lab for testing. This information is not intended to replace advice given to you by your health care provider. Make sure you discuss any questions you have with your health care provider. Document Revised: 10/19/2018 Document Reviewed: 10/19/2018 Elsevier Patient Education  2020 [employer]  

## 2019-08-08 NOTE — Progress Notes (Signed)
PRE-OPERATIVE HISTORY AND PHYSICAL EXAM  HPI:  Carol Carter is a 29 y.o. G0P0000 Patient's last menstrual period was 07/19/2019.; she is being admitted for surgery related to pelvic pain and a persistent 6 cm left ovarian cyst first diagnosed by CT scan 6 weeks ago, and found to be unchanged in size on follow up ultrasound today.  Continues with frequent almost daily LLQ pain that is severe at times, no radiaiton but associated with nausea and lack of energy, has to bend over or rest to help alleviate pain, and has also been seen by urology (hematuria) with normal cystoscopy procedure and evaluation (today).  Desires surgery to remove cyst vs continued ecpectant management.Marland Kitchen  PMHx: Past Medical History:  Diagnosis Date  . Dysmenorrhea    Past Surgical History:  Procedure Laterality Date  . WISDOM TOOTH EXTRACTION  07/17/2016   Family History  Problem Relation Age of Onset  . Thyroid disease Mother   . Hypertension Brother   . Liver disease Brother   . Colon cancer Maternal Aunt 68   Social History   Tobacco Use  . Smoking status: Current Every Day Smoker    Packs/day: 0.10    Types: Cigarettes  . Smokeless tobacco: Never Used  Substance Use Topics  . Alcohol use: Yes    Comment: Occasional  . Drug use: No    Current Outpatient Medications:  .  Norgestimate-Ethinyl Estradiol Triphasic 0.18/0.215/0.25 MG-25 MCG tab, Take 1 tablet by mouth daily., Disp: 3 Package, Rfl: 3 Allergies: Patient has no known allergies.  Review of Systems  Constitutional: Positive for malaise/fatigue. Negative for chills and fever.  HENT: Negative for congestion, sinus pain and sore throat.   Eyes: Negative for blurred vision and pain.  Respiratory: Negative for cough and wheezing.   Cardiovascular: Negative for chest pain and leg swelling.  Gastrointestinal: Positive for abdominal pain and nausea. Negative for constipation, diarrhea, heartburn and vomiting.  Genitourinary: Negative for  dysuria, frequency, hematuria and urgency.  Musculoskeletal: Negative for back pain, joint pain, myalgias and neck pain.  Skin: Negative for itching and rash.  Neurological: Negative for dizziness, tremors and weakness.  Endo/Heme/Allergies: Does not bruise/bleed easily.  Psychiatric/Behavioral: Negative for depression. The patient is not nervous/anxious and does not have insomnia.     Objective: BP 100/60   Ht 5\' 3"  (1.6 m)   Wt 164 lb (74.4 kg)   LMP 07/19/2019   BMI 29.05 kg/m   Filed Weights   08/08/19 1416  Weight: 164 lb (74.4 kg)   Physical Exam Constitutional:      General: She is not in acute distress.    Appearance: She is well-developed.  HENT:     Head: Normocephalic and atraumatic. No laceration.     Right Ear: Hearing normal.     Left Ear: Hearing normal.     Mouth/Throat:     Pharynx: Uvula midline.  Eyes:     Pupils: Pupils are equal, round, and reactive to light.  Neck:     Thyroid: No thyromegaly.  Cardiovascular:     Rate and Rhythm: Normal rate and regular rhythm.     Heart sounds: No murmur. No friction rub. No gallop.   Pulmonary:     Effort: Pulmonary effort is normal. No respiratory distress.     Breath sounds: Normal breath sounds. No wheezing.  Chest:     Breasts:        Right: No mass, skin change or tenderness.  Left: No mass, skin change or tenderness.  Abdominal:     General: Bowel sounds are normal. There is no distension.     Palpations: Abdomen is soft.     Tenderness: There is generalized abdominal tenderness and tenderness in the left lower quadrant. There is no rebound.  Musculoskeletal:        General: Normal range of motion.     Cervical back: Normal range of motion and neck supple.  Neurological:     Mental Status: She is alert and oriented to person, place, and time.     Cranial Nerves: No cranial nerve deficit.  Skin:    General: Skin is warm and dry.  Psychiatric:        Judgment: Judgment normal.  Vitals  reviewed.     Assessment: 1. Left ovarian cyst   2. LLQ pain   Options discussed, plan Laparoscopy with left ovarian cystectomy Consent today Surgery next week  I have had a careful discussion with this patient about all the options available and the risk/benefits of each. I have fully informed this patient that surgery may subject her to a variety of discomforts and risks: She understands that most patients have surgery with little difficulty, but problems can happen ranging from minor to fatal. These include nausea, vomiting, pain, bleeding, infection, poor healing, hernia, or formation of adhesions. Unexpected reactions may occur from any drug or anesthetic given. Unintended injury may occur to other pelvic or abdominal structures such as Fallopian tubes, ovaries, bladder, ureter (tube from kidney to bladder), or bowel. Nerves going from the pelvis to the legs may be injured. Any such injury may require immediate or later additional surgery to correct the problem. Excessive blood loss requiring transfusion is very unlikely but possible. Dangerous blood clots may form in the legs or lungs. Physical and sexual activity will be restricted in varying degrees for an indeterminate period of time but most often 2-6 weeks.  Finally, she understands that it is impossible to list every possible undesirable effect and that the condition for which surgery is done is not always cured or significantly improved, and in rare cases may be even worse.Ample time was given to answer all questions.  Paul Joda Braatz, MD, FACOG [facility], [facility] 08/08/2019  3:15 PM  

## 2019-08-09 ENCOUNTER — Telehealth: Payer: Self-pay | Admitting: Obstetrics & Gynecology

## 2019-08-09 NOTE — Telephone Encounter (Signed)
-----   Message from Nadara Mustard, MD sent at 08/08/2019  3:11 PM EDT ----- Regarding: surgery Surgery Booking Request Patient Full Name:  Carol Carter  MRN: [id number]  DOB: 25-Jul-1990  Surgeon: [name], MD  Requested Surgery Date and Time: 08/15/19 Primary Diagnosis AND Code: Left Ovarian Cyst, LLQ pain Secondary Diagnosis and Code:   ICD-10-CM  1. Left ovarian cyst  N83.202  2. LLQ pain  R10.32  Surgical Procedure: Laparoscopy with Ovarian Cystectomy L&D Notification: No Admission Status: same day surgery Length of Surgery: 30 min Special Case Needs: No H&P: No Phone Interview???:  Yes Interpreter: No Language:  Medical Clearance:  No Special Scheduling Instructions: none Any known health/anesthesia issues, diabetes, sleep apnea, latex allergy, defibrillator/pacemaker?: No Acuity: P2   (P1 highest, P2 delay may cause harm, P3 low, elective gyn, P4 lowest)

## 2019-08-09 NOTE — Telephone Encounter (Signed)
LMPTRC 

## 2019-08-12 ENCOUNTER — Other Ambulatory Visit: Payer: Self-pay

## 2019-08-12 ENCOUNTER — Encounter
Admission: RE | Admit: 2019-08-12 | Discharge: 2019-08-12 | Disposition: A | Discharge status: Home / Self Care | Payer: BC Managed Care – PPO | Source: Ambulatory Visit | Attending: Obstetrics & Gynecology | Admitting: Obstetrics & Gynecology

## 2019-08-12 DIAGNOSIS — R8271 Bacteriuria: Secondary | ICD-10-CM | POA: Insufficient documentation

## 2019-08-12 HISTORY — DX: Personal history of urinary calculi: Z87.442

## 2019-08-12 HISTORY — DX: Unspecified asthma, uncomplicated: J45.909

## 2019-08-12 NOTE — Patient Instructions (Addendum)
COVID TESTING Date: Tuesday, May 11 Testing site:  [facility] [address] Thru Hours:  9:56 am - 1:00 pm Once you are tested, you are asked to stay quarantined (avoiding public places) until after your surgery.  Your procedure is scheduled on: Thursday, May 13 Report to Day Surgery on the 2nd floor of the [facility] To find out your arrival time, please call [phone] between 1PM - 3PM on: Wednesday, May 12  REMEMBER: Instructions that are not followed completely may result in serious medical risk, up to and including death; or upon the discretion of your surgeon and anesthesiologist your surgery may need to be rescheduled.  Do not eat food after midnight the night before surgery.  No gum chewing, lozengers or hard candies.  You may however, drink CLEAR liquids up to 2 hours before you are scheduled to arrive for your surgery. Do not drink anything within 2 hours of your scheduled arrival time.  Clear liquids include: - water  - apple juice without pulp - gatorade (not RED) - black coffee or tea (Do NOT add milk or creamers to the coffee or tea) Do NOT drink anything that is not on this list.  DO NOT TAKE ANY MEDICATIONS THE MORNING OF SURGERY  Stop Anti-inflammatories (NSAIDS) such as Advil, Aleve, Ibuprofen, Motrin, Naproxen, Naprosyn and Aspirin based products such as Excedrin, Goodys Powder, BC Powder. (May take Tylenol or Acetaminophen if needed.)  Stop ANY OVER THE COUNTER supplements until after surgery.  No Alcohol for 24 hours before or after surgery.  No Smoking including e-cigarettes for 24 hours prior to surgery.   On the morning of surgery brush your teeth with toothpaste and water, you may rinse your mouth with mouthwash if you wish. Do not swallow any toothpaste or mouthwash.  Do not wear jewelry, make-up, hairpins, clips or nail polish.  Do not wear lotions, powders, or perfumes.   Do not shave 48 hours prior to  surgery.   Do not bring valuables to the hospital. [facility] is not responsible for any missing/lost belongings or valuables.   Use CHG Soap as directed on instruction sheet.  Notify your doctor if there is any change in your medical condition (cold, fever, infection).  Wear comfortable clothing (specific to your surgery type) to the hospital.  Plan for stool softeners for home use; pain medications have a tendency to cause constipation. You can also help prevent constipation by eating foods high in fiber such as fruits and vegetables and drinking plenty of fluids as your diet allows.  After surgery, you can help prevent lung complications by doing breathing exercises.  Take deep breaths and cough every 1-2 hours. Your doctor may order a device called an Incentive Spirometer to help you take deep breaths. When coughing or sneezing, hold a pillow firmly against your incision with both hands. This is called "splinting." Doing this helps protect your incision. It also decreases belly discomfort.  If you are being discharged the day of surgery, you will not be allowed to drive home. You will need a responsible adult (18 years or older) to drive you home and stay with you that night.   If you are taking public transportation, you will need to have a responsible adult (18 years or older) with you. Please confirm with your physician that it is acceptable to use public transportation.   Please call the Pre-admissions Testing Dept. at [phone] if you have any questions about these instructions.  Visitation Policy:  Patients undergoing a surgery or procedure may have one family member or support person with them as long as that person is not COVID-19 positive or experiencing its symptoms.  That person may remain in the waiting area during the procedure.

## 2019-08-13 ENCOUNTER — Other Ambulatory Visit
Admission: RE | Admit: 2019-08-13 | Discharge: 2019-08-13 | Disposition: A | Discharge status: Home / Self Care | Payer: BC Managed Care – PPO | Source: Ambulatory Visit | Attending: Obstetrics & Gynecology | Admitting: Obstetrics & Gynecology

## 2019-08-13 DIAGNOSIS — Z01812 Encounter for preprocedural laboratory examination: Secondary | ICD-10-CM | POA: Diagnosis not present

## 2019-08-13 DIAGNOSIS — Z20822 Contact with and (suspected) exposure to covid-19: Secondary | ICD-10-CM | POA: Insufficient documentation

## 2019-08-13 LAB — CBC
HCT: 37.8 % (ref 36.0–46.0)
Hemoglobin: 13.1 g/dL (ref 12.0–15.0)
MCH: 30.8 pg (ref 26.0–34.0)
MCHC: 34.7 g/dL (ref 30.0–36.0)
MCV: 88.9 fL (ref 80.0–100.0)
Platelets: 456 10*3/uL — ABNORMAL HIGH (ref 150–400)
RBC: 4.25 MIL/uL (ref 3.87–5.11)
RDW: 12.9 % (ref 11.5–15.5)
WBC: 7.2 10*3/uL (ref 4.0–10.5)
nRBC: 0 % (ref 0.0–0.2)

## 2019-08-13 LAB — TYPE AND SCREEN
ABO/RH(D): A POS
Antibody Screen: NEGATIVE

## 2019-08-13 LAB — SARS CORONAVIRUS 2 (TAT 6-24 HRS): SARS Coronavirus 2: NEGATIVE

## 2019-08-15 ENCOUNTER — Ambulatory Visit: Payer: BC Managed Care – PPO | Admitting: Anesthesiology

## 2019-08-15 ENCOUNTER — Other Ambulatory Visit: Payer: Self-pay | Admitting: Obstetrics & Gynecology

## 2019-08-15 ENCOUNTER — Other Ambulatory Visit: Payer: Self-pay

## 2019-08-15 ENCOUNTER — Encounter: Payer: Self-pay | Admitting: Obstetrics & Gynecology

## 2019-08-15 ENCOUNTER — Ambulatory Visit
Admission: RE | Admit: 2019-08-15 | Discharge: 2019-08-15 | Disposition: A | Discharge status: Home / Self Care | Payer: BC Managed Care – PPO | Attending: Obstetrics & Gynecology | Admitting: Obstetrics & Gynecology

## 2019-08-15 ENCOUNTER — Encounter
Admission: RE | Disposition: A | Discharge status: Home / Self Care | Payer: Self-pay | Source: Home / Self Care | Attending: Obstetrics & Gynecology

## 2019-08-15 ENCOUNTER — Telehealth: Payer: Self-pay

## 2019-08-15 DIAGNOSIS — G4730 Sleep apnea, unspecified: Secondary | ICD-10-CM | POA: Diagnosis not present

## 2019-08-15 DIAGNOSIS — N8302 Follicular cyst of left ovary: Secondary | ICD-10-CM | POA: Insufficient documentation

## 2019-08-15 DIAGNOSIS — N83202 Unspecified ovarian cyst, left side: Secondary | ICD-10-CM | POA: Diagnosis not present

## 2019-08-15 DIAGNOSIS — F17210 Nicotine dependence, cigarettes, uncomplicated: Secondary | ICD-10-CM | POA: Insufficient documentation

## 2019-08-15 DIAGNOSIS — N9489 Other specified conditions associated with female genital organs and menstrual cycle: Secondary | ICD-10-CM | POA: Diagnosis not present

## 2019-08-15 HISTORY — PX: LAPAROSCOPIC OVARIAN CYSTECTOMY: SHX6248

## 2019-08-15 LAB — POCT PREGNANCY, URINE: Preg Test, Ur: NEGATIVE

## 2019-08-15 LAB — ABO/RH: ABO/RH(D): A POS

## 2019-08-15 SURGERY — EXCISION, CYST, OVARY, LAPAROSCOPIC
Anesthesia: General | Laterality: Left

## 2019-08-15 MED ORDER — PROPOFOL 10 MG/ML IV BOLUS
INTRAVENOUS | Status: DC | PRN
  Administered 2019-08-15: 200 mg via INTRAVENOUS

## 2019-08-15 MED ORDER — FENTANYL CITRATE (PF) 100 MCG/2ML IJ SOLN
25.0000 ug | INTRAMUSCULAR | Status: DC | PRN
  Administered 2019-08-15: 25 ug via INTRAVENOUS

## 2019-08-15 MED ORDER — ONDANSETRON HCL 4 MG/2ML IJ SOLN
INTRAMUSCULAR | Status: DC | PRN
  Administered 2019-08-15: 4 mg via INTRAVENOUS

## 2019-08-15 MED ORDER — LIDOCAINE HCL (CARDIAC) PF 100 MG/5ML IV SOSY
PREFILLED_SYRINGE | INTRAVENOUS | Status: DC | PRN
  Administered 2019-08-15: 80 mg via INTRAVENOUS

## 2019-08-15 MED ORDER — LACTATED RINGERS IV SOLN: INTRAVENOUS | Status: DC

## 2019-08-15 MED ORDER — BUPIVACAINE HCL (PF) 0.5 % IJ SOLN
INTRAMUSCULAR | Status: DC | PRN
  Administered 2019-08-15: 13 mL

## 2019-08-15 MED ORDER — OXYCODONE-ACETAMINOPHEN 5-325 MG PO TABS: 1.0000 | ORAL_TABLET | ORAL | 0 refills | Status: DC | PRN

## 2019-08-15 MED ORDER — OXYCODONE HCL 5 MG PO TABS
ORAL_TABLET | ORAL | Status: AC
  Filled 2019-08-15: qty 1

## 2019-08-15 MED ORDER — FAMOTIDINE 20 MG PO TABS
20.0000 mg | ORAL_TABLET | Freq: Once | ORAL | Status: AC
  Administered 2019-08-15: 20 mg via ORAL

## 2019-08-15 MED ORDER — ONDANSETRON HCL 4 MG/2ML IJ SOLN
INTRAMUSCULAR | Status: AC
  Filled 2019-08-15: qty 2

## 2019-08-15 MED ORDER — MIDAZOLAM HCL 2 MG/2ML IJ SOLN
INTRAMUSCULAR | Status: DC | PRN
  Administered 2019-08-15: 2 mg via INTRAVENOUS

## 2019-08-15 MED ORDER — MORPHINE SULFATE (PF) 4 MG/ML IV SOLN: 1.0000 mg | INTRAVENOUS | Status: DC | PRN

## 2019-08-15 MED ORDER — DEXAMETHASONE SODIUM PHOSPHATE 10 MG/ML IJ SOLN
INTRAMUSCULAR | Status: DC | PRN
  Administered 2019-08-15: 10 mg via INTRAVENOUS

## 2019-08-15 MED ORDER — DEXMEDETOMIDINE HCL IN NACL 80 MCG/20ML IV SOLN
INTRAVENOUS | Status: AC
  Filled 2019-08-15: qty 20

## 2019-08-15 MED ORDER — MIDAZOLAM HCL 2 MG/2ML IJ SOLN
INTRAMUSCULAR | Status: AC
  Filled 2019-08-15: qty 2

## 2019-08-15 MED ORDER — KETOROLAC TROMETHAMINE 30 MG/ML IJ SOLN
INTRAMUSCULAR | Status: DC | PRN
  Administered 2019-08-15: 30 mg via INTRAVENOUS

## 2019-08-15 MED ORDER — SUGAMMADEX SODIUM 200 MG/2ML IV SOLN
INTRAVENOUS | Status: DC | PRN
  Administered 2019-08-15: 400 mg via INTRAVENOUS

## 2019-08-15 MED ORDER — FENTANYL CITRATE (PF) 100 MCG/2ML IJ SOLN
INTRAMUSCULAR | Status: DC | PRN
  Administered 2019-08-15: 100 ug via INTRAVENOUS

## 2019-08-15 MED ORDER — BUPIVACAINE HCL (PF) 0.5 % IJ SOLN
INTRAMUSCULAR | Status: AC
  Filled 2019-08-15: qty 30

## 2019-08-15 MED ORDER — FENTANYL CITRATE (PF) 100 MCG/2ML IJ SOLN
INTRAMUSCULAR | Status: AC
  Administered 2019-08-15: 25 ug via INTRAVENOUS
  Filled 2019-08-15: qty 2

## 2019-08-15 MED ORDER — OXYCODONE HCL 5 MG/5ML PO SOLN: 5.0000 mg | Freq: Once | ORAL | Status: AC | PRN

## 2019-08-15 MED ORDER — FENTANYL CITRATE (PF) 100 MCG/2ML IJ SOLN
INTRAMUSCULAR | Status: AC
  Filled 2019-08-15: qty 2

## 2019-08-15 MED ORDER — FAMOTIDINE 20 MG PO TABS
ORAL_TABLET | ORAL | Status: AC
  Filled 2019-08-15: qty 1

## 2019-08-15 MED ORDER — DEXAMETHASONE SODIUM PHOSPHATE 10 MG/ML IJ SOLN
INTRAMUSCULAR | Status: AC
  Filled 2019-08-15: qty 1

## 2019-08-15 MED ORDER — OXYCODONE-ACETAMINOPHEN 7.5-325 MG PO TABS: 1.0000 | ORAL_TABLET | ORAL | 0 refills | Status: DC | PRN

## 2019-08-15 MED ORDER — ACETAMINOPHEN 650 MG RE SUPP
650.0000 mg | RECTAL | Status: DC | PRN
  Filled 2019-08-15: qty 1

## 2019-08-15 MED ORDER — ONDANSETRON HCL 4 MG/2ML IJ SOLN: 4.0000 mg | Freq: Once | INTRAMUSCULAR | Status: DC | PRN

## 2019-08-15 MED ORDER — DEXMEDETOMIDINE HCL 200 MCG/2ML IV SOLN
INTRAVENOUS | Status: DC | PRN
  Administered 2019-08-15: 4 ug via INTRAVENOUS
  Administered 2019-08-15: 8 ug via INTRAVENOUS

## 2019-08-15 MED ORDER — ACETAMINOPHEN 325 MG PO TABS: 650.0000 mg | ORAL_TABLET | ORAL | Status: DC | PRN

## 2019-08-15 MED ORDER — ACETAMINOPHEN 10 MG/ML IV SOLN: 1000.0000 mg | Freq: Once | INTRAVENOUS | Status: DC | PRN

## 2019-08-15 MED ORDER — OXYCODONE HCL 5 MG PO TABS
5.0000 mg | ORAL_TABLET | Freq: Once | ORAL | Status: AC | PRN
  Administered 2019-08-15: 5 mg via ORAL

## 2019-08-15 MED ORDER — OXYCODONE-ACETAMINOPHEN 5-325 MG PO TABS: 1.0000 | ORAL_TABLET | ORAL | Status: DC | PRN

## 2019-08-15 MED ORDER — ROCURONIUM BROMIDE 100 MG/10ML IV SOLN
INTRAVENOUS | Status: DC | PRN
  Administered 2019-08-15: 10 mg via INTRAVENOUS
  Administered 2019-08-15: 40 mg via INTRAVENOUS

## 2019-08-15 SURGICAL SUPPLY — 40 items
BLADE SURG SZ11 CARB STEEL (BLADE) ×3 IMPLANT
CANISTER SUCT 1200ML W/VALVE (MISCELLANEOUS) ×3 IMPLANT
CATH ROBINSON RED A/P 16FR (CATHETERS) ×3 IMPLANT
CHLORAPREP W/TINT 26 (MISCELLANEOUS) ×3 IMPLANT
COVER WAND RF STERILE (DRAPES) IMPLANT
DERMABOND ADVANCED (GAUZE/BANDAGES/DRESSINGS) ×2
DERMABOND ADVANCED .7 DNX12 (GAUZE/BANDAGES/DRESSINGS) ×1 IMPLANT
DRSG TELFA 4X3 1S NADH ST (GAUZE/BANDAGES/DRESSINGS) IMPLANT
GLOVE BIO SURGEON STRL SZ8 (GLOVE) ×6 IMPLANT
GLOVE INDICATOR 8.0 STRL GRN (GLOVE) ×3 IMPLANT
GOWN STRL REUS W/ TWL LRG LVL3 (GOWN DISPOSABLE) ×1 IMPLANT
GOWN STRL REUS W/ TWL XL LVL3 (GOWN DISPOSABLE) ×1 IMPLANT
GOWN STRL REUS W/TWL LRG LVL3 (GOWN DISPOSABLE) ×2
GOWN STRL REUS W/TWL XL LVL3 (GOWN DISPOSABLE) ×2
GRASPER SUT TROCAR 14GX15 (MISCELLANEOUS) ×3 IMPLANT
IRRIGATION STRYKERFLOW (MISCELLANEOUS) IMPLANT
IRRIGATOR STRYKERFLOW (MISCELLANEOUS)
IV LACTATED RINGERS 1000ML (IV SOLUTION) IMPLANT
KIT PINK PAD W/HEAD ARE REST (MISCELLANEOUS) ×3
KIT PINK PAD W/HEAD ARM REST (MISCELLANEOUS) ×1 IMPLANT
LABEL OR SOLS (LABEL) ×3 IMPLANT
NEEDLE VERESS 14GA 120MM (NEEDLE) ×3 IMPLANT
NS IRRIG 500ML POUR BTL (IV SOLUTION) ×3 IMPLANT
PACK GYN LAPAROSCOPIC (MISCELLANEOUS) ×3 IMPLANT
PAD PREP 24X41 OB/GYN DISP (PERSONAL CARE ITEMS) ×3 IMPLANT
POUCH SPECIMEN RETRIEVAL 10MM (ENDOMECHANICALS) IMPLANT
SCISSORS METZENBAUM CVD 33 (INSTRUMENTS) ×1 IMPLANT
SET TUBE SMOKE EVAC HIGH FLOW (TUBING) ×3 IMPLANT
SHEARS HARMONIC ACE PLUS 36CM (ENDOMECHANICALS) ×2 IMPLANT
SLEEVE ENDOPATH XCEL 5M (ENDOMECHANICALS) IMPLANT
SPONGE GAUZE 2X2 8PLY STER LF (GAUZE/BANDAGES/DRESSINGS)
SPONGE GAUZE 2X2 8PLY STRL LF (GAUZE/BANDAGES/DRESSINGS) IMPLANT
STRAP SAFETY 5IN WIDE (MISCELLANEOUS) ×3 IMPLANT
SUT VIC AB 0 CT1 36 (SUTURE) ×3 IMPLANT
SUT VIC AB 2-0 UR6 27 (SUTURE) IMPLANT
SUT VIC AB 4-0 PS2 18 (SUTURE) IMPLANT
SYR 10ML LL (SYRINGE) ×3 IMPLANT
SYSTEM WECK SHIELD CLOSURE (TROCAR) IMPLANT
TROCAR ENDO BLADELESS 11MM (ENDOMECHANICALS) ×2 IMPLANT
TROCAR XCEL NON-BLD 5MMX100MML (ENDOMECHANICALS) ×3 IMPLANT

## 2019-08-15 NOTE — Transfer of Care (Signed)
Immediate Anesthesia Transfer of Care Note  Patient: Carol Carter  Procedure(s) Performed: LAPAROSCOPIC OVARIAN CYSTECTOMY (Left )  Patient Location: PACU  Anesthesia Type:General  Level of Consciousness: sedated  Airway & Oxygen Therapy: Patient Spontanous Breathing and Patient connected to face mask oxygen  Post-op Assessment: Report given to RN and Post -op Vital signs reviewed and stable  Post vital signs: Reviewed and stable  Last Vitals:  Vitals Value Taken Time  BP 118/84 08/15/19 1235  Temp 36.2 C 08/15/19 1235  Pulse 62 08/15/19 1243  Resp 18 08/15/19 1243  SpO2 100 % 08/15/19 1243  Vitals shown include unvalidated device data.  Last Pain:  Vitals:   08/15/19 1235  TempSrc:   PainSc: Asleep      Patients Stated Pain Goal: 0 (08/15/19 1043)  Complications: No apparent anesthesia complications

## 2019-08-15 NOTE — Interval H&P Note (Signed)
History and Physical Interval Note:  08/15/2019 11:05 AM  Carol Carter  has presented today for surgery, with the diagnosis of Left Ovarian Cyst  pain.  The various methods of treatment have been discussed with the patient and family. After consideration of risks, benefits and other options for treatment, the patient has consented to  Procedure(s): LAPAROSCOPIC OVARIAN CYSTECTOMY (Left) as a surgical intervention.  The patient's history has been reviewed, patient examined, no change in status, stable for surgery.  I have reviewed the patient's chart and labs.  Questions were answered to the patient's satisfaction.     [name]

## 2019-08-15 NOTE — Anesthesia Postprocedure Evaluation (Signed)
Anesthesia Post Note  Patient: Carol Carter  Procedure(s) Performed: LAPAROSCOPIC OVARIAN CYSTECTOMY (Left )  Patient location during evaluation: PACU Anesthesia Type: General Level of consciousness: awake and alert Pain management: pain level controlled Vital Signs Assessment: post-procedure vital signs reviewed and stable Respiratory status: spontaneous breathing, nonlabored ventilation, respiratory function stable and patient connected to nasal cannula oxygen Cardiovascular status: blood pressure returned to baseline and stable Postop Assessment: no apparent nausea or vomiting Anesthetic complications: no     Last Vitals:  Vitals:   08/15/19 1350 08/15/19 1408  BP: 107/63 105/61  Pulse: 64 65  Resp: 16 16  Temp: 37.1 C   SpO2: 100% 100%    Last Pain:  Vitals:   08/15/19 1408  TempSrc:   PainSc: 4                  Corinda Gubler

## 2019-08-15 NOTE — Anesthesia Preprocedure Evaluation (Signed)
Anesthesia Evaluation  Patient identified by MRN, date of birth, ID band Patient awake    Reviewed: Allergy & Precautions, NPO status , Patient's Chart, lab work & pertinent test results  History of Anesthesia Complications Negative for: history of anesthetic complications  Airway Mallampati: II  TM Distance: >3 FB Neck ROM: Full    Dental no notable dental hx. (+) Teeth Intact, Dental Advisory Given   Pulmonary sleep apnea , neg COPD, Current Smoker and Patient abstained from smoking.,    Pulmonary exam normal breath sounds clear to auscultation       Cardiovascular Exercise Tolerance: Good METS(-) hypertension(-) CAD and (-) Past MI negative cardio ROS  (-) dysrhythmias  Rhythm:Regular Rate:Normal - Systolic murmurs    Neuro/Psych negative neurological ROS  negative psych ROS   GI/Hepatic neg GERD  ,(+)     (-) substance abuse  ,   Endo/Other  neg diabetes  Renal/GU negative Renal ROS     Musculoskeletal   Abdominal   Peds  Hematology   Anesthesia Other Findings Past Medical History: No date: Asthma     Comment:  teenager No date: Dysmenorrhea No date: History of kidney stones 08/2019: Left ovarian cyst 2017: Sleep apnea     Comment:  does not wear cpap  Reproductive/Obstetrics                            Anesthesia Physical Anesthesia Plan  ASA: II  Anesthesia Plan: General   Post-op Pain Management:    Induction: Intravenous  PONV Risk Score and Plan: 3 and Ondansetron, Dexamethasone and Midazolam  Airway Management Planned: Oral ETT  Additional Equipment: None  Intra-op Plan:   Post-operative Plan: Extubation in OR  Informed Consent: I have reviewed the patients History and Physical, chart, labs and discussed the procedure including the risks, benefits and alternatives for the proposed anesthesia with the patient or authorized representative who has indicated  his/her understanding and acceptance.     Dental advisory given  Plan Discussed with: CRNA and Surgeon  Anesthesia Plan Comments: (Discussed risks of anesthesia with patient, including PONV, sore throat, lip/dental damage. Rare risks discussed as well, such as cardiorespiratory and neurological sequelae. Patient understands.)        Anesthesia Quick Evaluation

## 2019-08-15 NOTE — Anesthesia Procedure Notes (Signed)
Procedure Name: Intubation Date/Time: 08/15/2019 11:29 AM Performed by: Almeta Monas, CRNA Pre-anesthesia Checklist: Patient identified, Patient being monitored, Timeout performed, Emergency Drugs available and Suction available Patient Re-evaluated:Patient Re-evaluated prior to induction Oxygen Delivery Method: Circle system utilized Preoxygenation: Pre-oxygenation with 100% oxygen Induction Type: IV induction Ventilation: Mask ventilation without difficulty Laryngoscope Size: 3 and McGraph Grade View: Grade I Tube type: Oral Tube size: 7.0 mm Number of attempts: 1 Airway Equipment and Method: Stylet and Video-laryngoscopy Placement Confirmation: ETT inserted through vocal cords under direct vision,  positive ETCO2 and breath sounds checked- equal and bilateral Secured at: 21 cm Tube secured with: Tape Dental Injury: Teeth and Oropharynx as per pre-operative assessment

## 2019-08-15 NOTE — Telephone Encounter (Signed)
Carol Carter w/[facility] [address] states the dose sent over on patient's pain meds is higher than they are allowed to fill. She is Opiod Naive according to [facility] care which checks to see if she's had Opiods recently. They can do 1 of 2 things. 1)Find out if she had any pain meds at hospital/what she had-They can fill as a non-acute fill bc it will be the 2nd time she's had it. Or 2) The dosage decrease to max of QID, Currently it reads Oxycodone 7.5/325 prn which puts her at 6/day which is #67. They need to get it down to less than 50 which would be a max of 4/day unless she has had some type of pain medication recently at the hospital.  [phone]

## 2019-08-15 NOTE — Telephone Encounter (Signed)
Rx changed per [facility]. Pharmacy notified/rx was received.

## 2019-08-15 NOTE — Op Note (Signed)
  Operative Note   08/15/2019  PRE-OP DIAGNOSIS: Left Ovarian Cyst, Pelvic Pain   POST-OP DIAGNOSIS: same   PROCEDURE: Procedure(s): LAPAROSCOPIC LEFT OVARIAN CYSTECTOMY   SURGEON: Annamarie Major, MD, FACOG  ASST: Dr Audree Camel, MD, No other capable assistant available, in surgery requiring high level assistant.  ANESTHESIA: Choice   ESTIMATED BLOOD LOSS: Min, <10 mL  COMPLICATIONS: None  DISPOSITION: PACU - hemodynamically stable.  CONDITION: stable  FINDINGS: Laparoscopic survey of the abdomen revealed a grossly normal uterus, tubes, right ovary, liver edge, gallbladder edge and appendix, No intra-abdominal adhesions were noted. Left simple appearing clear fluid filled Ovarian Cyst noted.  PROCEDURE IN DETAIL: The patient was taken to the OR where anesthesia was administed. The patient was positioned in dorsal lithotomy in the Homer stirrups. The patient was then examined under anesthesia with the above noted findings. The patient was prepped and draped in the normal sterile fashion and bladder was drained with a red rubber catheter. A Graves speculum was placed in the vagina and the anterior lip of the cervix was grasped with a single toothed tenaculum. A sponge stick manipulator was then inserted in the uterus. Uterine mobility was found to be satisfactory. The speculum was then removed.  Attention was turned to the patient's abdomen where a 5 mm skin incision was made in the umbilical fold, after injection of local anesthesia. The Veress step needle was carefully introduced into the peritoneal cavity with placement confirmed using the hanging drop technique.  Pneumoperitoneum was obtained. The 5 mm port was then placed under direct visualization with the operative laparoscope.  Trendelenburg positioning.  Additional 65mm trocar was then placed in the RLQ lateral to the inferior epigastric blood vessels under direct visualization with the laparoscope.  Instrumentation to visualize  complete pelvic anatomy performed.  A 50mm trocar was also then placed in the LLQ.  The ovarian cyst is identified and stabilized.  An incision is made with clear fluid noted.  Fluid is aspirated.  Cyst wall is dissected free from the ovarian cortex and removed.  Hemostasis is visualized and assured.  Contralateral ovary seen as normal.  Pelvic cavity is cleaned with any fluid aspirated.  Fascia at 11 mm port site is closed with a 0 Vicryl suture using a fascia closure device.  The assistance of my assisting-physician was vital to resect and retract interchangably with self on each side.  Instruments and trocars removed, gas expelled, and skin closed with skin adhesive glue.  Instrument, needle, and sponge counts correct x2 at the conclusion of the case.  Pt goes to recovery room in stable condition.  Annamarie Major, MD, [facility], [facility] 08/15/2019  12:19 PM

## 2019-08-15 NOTE — Discharge Instructions (Addendum)
Diagnostic Laparoscopy, Care After This sheet gives you information about how to care for yourself after your procedure. Your health care provider may also give you more specific instructions. If you have problems or questions, contact your health care provider. What can I expect after the procedure? After the procedure, it is common to have:  Mild discomfort in the abdomen.  Sore throat. Women who have laparoscopy with pelvic examination may have mild cramping and fluid coming from the vagina for a few days after the procedure. Follow these instructions at home: Medicines  Take over-the-counter and prescription medicines only as told by your health care provider.  If you were prescribed an antibiotic medicine, take it as told by your health care provider. Do not stop taking the antibiotic even if you start to feel better. Driving  Do not drive for 24 hours if you were given a medicine to help you relax (sedative) during your procedure.  Do not drive or use heavy machinery while taking prescription pain medicine. Bathing  Do not take baths, swim, or use a hot tub until your health care provider approves. You may take showers. Incision care   Follow instructions from your health care provider about how to take care of your incisions. Make sure you: ? Wash your hands with soap and water before you change your bandage (dressing). If soap and water are not available, use hand sanitizer. ? Change your dressing as told by your health care provider. ? Leave stitches (sutures), skin glue, or adhesive strips in place. These skin closures may need to stay in place for 2 weeks or longer. If adhesive strip edges start to loosen and curl up, you may trim the loose edges. Do not remove adhesive strips completely unless your health care provider tells you to do that.  Check your incision areas every day for signs of infection. Check for: ? Redness, swelling, or pain. ? Fluid or  blood. ? Warmth. ? Pus or a bad smell. Activity  Return to your normal activities as told by your health care provider. Ask your health care provider what activities are safe for you.  Do not lift anything that is heavier than 10 lb (4.5 kg), or the limit that you are told, until your health care provider says that it is safe. General instructions  To prevent or treat constipation while you are taking prescription pain medicine, your health care provider may recommend that you: ? Drink enough fluid to keep your urine pale yellow. ? Take over-the-counter or prescription medicines. ? Eat foods that are high in fiber, such as fresh fruits and vegetables, whole grains, and beans. ? Limit foods that are high in fat and processed sugars, such as fried and sweet foods.  Do not use any products that contain nicotine or tobacco, such as cigarettes and e-cigarettes. If you need help quitting, ask your health care provider.  Keep all follow-up visits as told by your health care provider. This is important. Contact a health care provider if:  You develop shoulder pain.  You feel lightheaded or faint.  You are unable to pass gas or have a bowel movement.  You feel nauseous or you vomit.  You develop a rash.  You have redness, swelling, or pain around any incision.  You have fluid or blood coming from any incision.  Any incision feels warm to the touch.  You have pus or a bad smell coming from any incision.  You have a fever or chills. Get help   right away if:  You have severe pain.  You have vomiting that does not go away.  You have heavy bleeding from the vagina.  Any incision opens.  You have trouble breathing.  You have chest pain. Summary  After the procedure, it is common to have mild discomfort in the abdomen and a sore throat.  Check your incision areas every day for signs of infection.  Return to your normal activities as told by your health care provider. Ask  your health care provider what activities are safe for you. This information is not intended to replace advice given to you by your health care provider. Make sure you discuss any questions you have with your health care provider. Document Revised: 03/03/2017 Document Reviewed: 09/14/2016 Elsevier Patient Education  2020 [employer]   AMBULATORY SURGERY  DISCHARGE INSTRUCTIONS   1) The drugs that you were given will stay in your system until tomorrow so for the next 24 hours you should not:  A) Drive an automobile B) Make any legal decisions C) Drink any alcoholic beverage   2) You may resume regular meals tomorrow.  Today it is better to start with liquids and gradually work up to solid foods.  You may eat anything you prefer, but it is better to start with liquids, then soup and crackers, and gradually work up to solid foods.   3) Please notify your doctor immediately if you have any unusual bleeding, trouble breathing, redness and pain at the surgery site, drainage, fever, or pain not relieved by medication.    4) Additional Instructions:        Please contact your physician with any problems or Same Day Surgery at [phone], Monday through Friday 6 am to 4 pm, or [place] at [place] Main number at [phone]. 

## 2019-08-16 ENCOUNTER — Telehealth: Payer: Self-pay

## 2019-08-16 LAB — SURGICAL PATHOLOGY

## 2019-08-16 NOTE — Telephone Encounter (Signed)
Spoke w/patient and advised per AMS 2 weeks is the typical time out of work. Patient needs a note. Notified will create and send to her thru My Chart.

## 2019-08-16 NOTE — Telephone Encounter (Signed)
Spoke w/pt. Advised per SDJ can take up to 2 pills every 4 hours for severe pain. Also, make sure she is taking ibuprofen in addition to the medication. Use heating pads, etc. It's very important that she take both. Advised on Ibuprofen dosing of 400mg  q 4/hr, 600mg /6hr, 800 mg/8hr.  She is inquiring when she can return to work.

## 2019-08-16 NOTE — Telephone Encounter (Addendum)
Patient had Lap Ovarian Cystectomy yesterday w/[facility]. She states the pain meds she was prescribed aren't working & she's in a lot of pain. Requesting call back. Cb#[phone]

## 2019-08-16 NOTE — Telephone Encounter (Signed)
I'll defer to Dr. Tiburcio Pea.  I'm guessing a couple of weeks (2-4). But, I don't know what [facility] says.  This can be answered more fully by him when he returns.  But, you can give her my rough estimate. Also, another MD might be better to help her today.  I"m behind in clinic and have a surgery at noon, which is going to be starting late.

## 2019-08-29 ENCOUNTER — Other Ambulatory Visit: Payer: Self-pay

## 2019-08-29 ENCOUNTER — Encounter: Payer: Self-pay | Admitting: Obstetrics & Gynecology

## 2019-08-29 ENCOUNTER — Ambulatory Visit (INDEPENDENT_AMBULATORY_CARE_PROVIDER_SITE_OTHER): Payer: BC Managed Care – PPO | Admitting: Obstetrics & Gynecology

## 2019-08-29 VITALS — BP 120/70 | Ht 63.0 in | Wt 162.0 lb

## 2019-08-29 DIAGNOSIS — R1032 Left lower quadrant pain: Secondary | ICD-10-CM

## 2019-08-29 DIAGNOSIS — N83202 Unspecified ovarian cyst, left side: Secondary | ICD-10-CM

## 2019-08-29 NOTE — Progress Notes (Signed)
  Postoperative Follow-up Patient presents post op from lap left ov cystectomy for painful cyst, 2 weeks ago. Path: DIAGNOSIS:  A. OVARIAN CYST, LEFT; CYSTECTOMY:  - FRAGMENTS OVARY WITH FEATURES OF HEMORRHAGIC FOLLICULAR CYST.  - NEGATIVE FOR MALIGNANCY.  Subjective: Patient reports marked improvement in her preop symptoms. Eating a regular diet without difficulty. The patient is not having any pain.  Activity: normal activities of daily living. Patient reports additional symptom's since surgery of None.  Objective: BP 120/70   Ht 5\' 3"  (1.6 m)   Wt 162 lb (73.5 kg)   BMI 28.70 kg/m  Physical Exam Constitutional:      General: She is not in acute distress.    Appearance: She is well-developed.  Cardiovascular:     Rate and Rhythm: Normal rate.  Pulmonary:     Effort: Pulmonary effort is normal.  Abdominal:     General: There is no distension.     Palpations: Abdomen is soft.     Tenderness: There is no abdominal tenderness.     Comments: Incision Healing Well   Musculoskeletal:        General: Normal range of motion.  Neurological:     Mental Status: She is alert and oriented to person, place, and time.     Cranial Nerves: No cranial nerve deficit.  Skin:    General: Skin is warm and dry.     Assessment: s/p :  Laparoscopy w Left Ovarian Cystectomy progressing well  Plan: Patient has done well after surgery with no apparent complications.  I have discussed the post-operative course to date, and the expected progress moving forward.  The patient understands what complications to be concerned about.  I will see the patient in routine follow up, or sooner if needed.    Activity plan: No restriction. Cont hormones for future cyst suppression  08/29/2019, 9:23 AM

## 2019-09-06 NOTE — Telephone Encounter (Signed)
Nurse visit scheduled Monday

## 2019-09-06 NOTE — Telephone Encounter (Signed)
Please advise 

## 2019-09-06 NOTE — Telephone Encounter (Signed)
Can you schedule pt? 

## 2019-09-09 ENCOUNTER — Ambulatory Visit (INDEPENDENT_AMBULATORY_CARE_PROVIDER_SITE_OTHER): Payer: BC Managed Care – PPO

## 2019-09-09 ENCOUNTER — Other Ambulatory Visit: Payer: Self-pay | Admitting: Obstetrics & Gynecology

## 2019-09-09 ENCOUNTER — Other Ambulatory Visit: Payer: Self-pay

## 2019-09-09 DIAGNOSIS — R3915 Urgency of urination: Secondary | ICD-10-CM

## 2019-09-09 LAB — POCT URINALYSIS DIPSTICK
Bilirubin, UA: NEGATIVE
Blood, UA: NEGATIVE
Glucose, UA: NEGATIVE
Ketones, UA: NEGATIVE
Nitrite, UA: NEGATIVE
Protein, UA: NEGATIVE
Spec Grav, UA: 1.015 (ref 1.010–1.025)
Urobilinogen, UA: NEGATIVE E.U./dL — AB
pH, UA: 6 (ref 5.0–8.0)

## 2019-09-09 MED ORDER — SULFAMETHOXAZOLE-TRIMETHOPRIM 800-160 MG PO TABS: 1.0000 | ORAL_TABLET | Freq: Two times a day (BID) | ORAL | 0 refills | Status: AC

## 2019-09-09 NOTE — Addendum Note (Signed)
Addended by: [place], Laquetta Racey B on: 09/09/2019 03:06 PM   Modules accepted: Orders

## 2019-09-11 LAB — URINE CULTURE

## 2019-09-12 ENCOUNTER — Telehealth: Payer: Self-pay | Admitting: Obstetrics & Gynecology

## 2019-09-12 ENCOUNTER — Other Ambulatory Visit: Payer: Self-pay | Admitting: Obstetrics & Gynecology

## 2019-09-12 MED ORDER — SULFAMETHOXAZOLE-TRIMETHOPRIM 800-160 MG PO TABS: 1.0000 | ORAL_TABLET | Freq: Two times a day (BID) | ORAL | 0 refills | Status: AC

## 2019-09-12 NOTE — Progress Notes (Signed)
Make sure pt gets ABX for UTI

## 2019-09-12 NOTE — Telephone Encounter (Signed)
Patient is calling to follow up on prescription sent in. Please advise

## 2019-09-12 NOTE — Progress Notes (Signed)
Left message to advise pt to get medication for uti

## 2019-09-12 NOTE — Progress Notes (Signed)
Pt states she already got the antibiotic Monday.

## 2019-09-12 NOTE — Progress Notes (Signed)
Pt had UTI like sx's and dropped off sample. UA pos, and culture E COli Pos Bactrim sent  Annamarie Major, MD, [facility], [facility] 09/12/2019  8:02 AM

## 2019-10-29 ENCOUNTER — Other Ambulatory Visit: Payer: Self-pay | Admitting: Obstetrics & Gynecology

## 2019-10-30 ENCOUNTER — Other Ambulatory Visit: Payer: Self-pay

## 2019-10-30 ENCOUNTER — Ambulatory Visit: Payer: BC Managed Care – PPO | Attending: Internal Medicine

## 2019-10-30 DIAGNOSIS — Z20822 Contact with and (suspected) exposure to covid-19: Secondary | ICD-10-CM

## 2019-10-31 LAB — SARS-COV-2, NAA 2 DAY TAT

## 2019-10-31 LAB — NOVEL CORONAVIRUS, NAA: SARS-CoV-2, NAA: NOT DETECTED

## 2019-12-09 ENCOUNTER — Encounter (HOSPITAL_COMMUNITY): Payer: Self-pay

## 2019-12-09 ENCOUNTER — Ambulatory Visit (INDEPENDENT_AMBULATORY_CARE_PROVIDER_SITE_OTHER): Payer: BC Managed Care – PPO

## 2019-12-09 ENCOUNTER — Other Ambulatory Visit: Payer: Self-pay

## 2019-12-09 ENCOUNTER — Ambulatory Visit (HOSPITAL_COMMUNITY)
Admission: EM | Admit: 2019-12-09 | Discharge: 2019-12-09 | Disposition: A | Discharge status: Home / Self Care | Payer: BC Managed Care – PPO | Attending: Family Medicine | Admitting: Family Medicine

## 2019-12-09 DIAGNOSIS — R079 Chest pain, unspecified: Secondary | ICD-10-CM

## 2019-12-09 MED ORDER — IBUPROFEN 600 MG PO TABS
600.0000 mg | ORAL_TABLET | Freq: Three times a day (TID) | ORAL | 0 refills | Status: DC | PRN
Start: 2019-12-09 — End: 2020-03-11

## 2019-12-09 NOTE — Discharge Instructions (Addendum)
Your EKG and x-ray were normal. Believe this is musculoskeletal.  You can do ibuprofen 600 mg every 8 hours. Rest, try heat to the area. Follow up as needed for continued or worsening symptoms

## 2019-12-09 NOTE — ED Triage Notes (Signed)
Pt is here with chest cramps that started Wednesday, pt has taken Tums to relieve discomfort.

## 2019-12-09 NOTE — ED Provider Notes (Signed)
[facility]    CSN: [id number] Arrival date & time: 12/09/19  7341      History   Chief Complaint Chief Complaint  Patient presents with  . Chest cramps    HPI Carol Carter is a 29 y.o. female.   Patient is a 29 year old female presents today with centered chest cramping that started Wednesday.  Started after bending over and felt a pull in the center of her chest.  Pain is been constant, waxing waning since then.  She is taking Tums to relieve discomfort without any relief.  Has not taken any anti-inflammatory pain medicine.  No cough, chest congestion, fevers.  No shortness of breath     Past Medical History:  Diagnosis Date  . Asthma    teenager  . Dysmenorrhea   . History of kidney stones   . Left ovarian cyst 08/2019  . Sleep apnea 2017   does not wear cpap    Patient Active Problem List   Diagnosis Date Noted  . Left ovarian cyst   . 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 Dysmenorrhea 09/22/2017  . Pelvic pain 09/15/2017  . Irregular intermenstrual bleeding 09/15/2017    Past Surgical History:  Procedure Laterality Date  . CYSTOSCOPY  08/08/2019  . LAPAROSCOPIC OVARIAN CYSTECTOMY Left 08/15/2019   Procedure: LAPAROSCOPIC OVARIAN CYSTECTOMY;  Surgeon: Nadara Mustard, MD;  Location: [facility] ORS;  Service: Gynecology;  Laterality: Left;  . WISDOM TOOTH EXTRACTION  07/17/2016    OB History    Gravida  0   Para  0   Term  0   Preterm  0   AB  0   Living  0     SAB  0   TAB  0   Ectopic  0   Multiple  0   Live Births  0            Home Medications    Prior to Admission medications   Medication Sig Start Date End Date Taking? Authorizing Provider  ibuprofen (ADVIL) 600 MG tablet Take 1 tablet (600 mg total) by mouth every 8 (eight) hours as needed for moderate pain. 12/09/19   Janace Aris, NP  TRI-LO-MILI 0.18/0.215/0.25 MG-25 MCG tab Take 1 tablet by mouth once daily 10/29/19   Nadara Mustard, MD    Family  History Family History  Problem Relation Age of Onset  . Thyroid disease Mother   . Hypertension Brother   . Liver disease Brother   . Colon cancer Maternal Aunt 52  . Diabetes Father   . Hypertension Father   . Heart Problems Father     Social History Social History   Tobacco Use  . Smoking status: Current Every Day Smoker    Packs/day: 0.10    Types: Cigarettes  . Smokeless tobacco: Never Used  Vaping Use  . Vaping Use: Never used  Substance Use Topics  . Alcohol use: Yes    Comment: Occasional  . Drug use: Yes    Types: Marijuana    Comment: over 10 years ago     Allergies   Patient has no known allergies.   Review of Systems Review of Systems   Physical Exam Triage Vital Signs ED Triage Vitals  Enc Vitals Group     BP 12/09/19 0917 100/64     Pulse Rate 12/09/19 0917 68     Resp 12/09/19 0917 18     Temp 12/09/19 0917 98.7 F (37.1 C)  Temp Source 12/09/19 0917 Oral     SpO2 12/09/19 0917 99 %     Weight --      Height --      Head Circumference --      Peak Flow --      Pain Score 12/09/19 0915 7     Pain Loc --      Pain Edu? --      Excl. in GC? --    No data found.  Updated Vital Signs BP 100/64 (BP Location: Right Arm)   Pulse 68   Temp 98.7 F (37.1 C) (Oral)   Resp 18   LMP 11/22/2019   SpO2 99%   Visual Acuity Right Eye Distance:   Left Eye Distance:   Bilateral Distance:    Right Eye Near:   Left Eye Near:    Bilateral Near:     Physical Exam Vitals and nursing note reviewed.  Constitutional:      General: She is not in acute distress.    Appearance: Normal appearance. She is not ill-appearing, toxic-appearing or diaphoretic.  HENT:     Head: Normocephalic.     Nose: Nose normal.  Eyes:     Conjunctiva/sclera: Conjunctivae normal.  Cardiovascular:     Rate and Rhythm: Normal rate and regular rhythm.  Pulmonary:     Effort: Pulmonary effort is normal.     Breath sounds: Normal breath sounds.  Chest:      Chest wall: Tenderness present. No mass or swelling.       Comments: Mildly tender to palpation. Musculoskeletal:        General: Normal range of motion.     Cervical back: Normal range of motion.  Skin:    General: Skin is warm and dry.     Findings: No rash.  Neurological:     Mental Status: She is alert.  Psychiatric:        Mood and Affect: Mood normal.      UC Treatments / Results  Labs (all labs ordered are listed, but only abnormal results are displayed) Labs Reviewed - No data to display  EKG   Radiology DG Chest 2 View  Result Date: 12/09/2019 CLINICAL DATA:  Chest pain. EXAM: CHEST - 2 VIEW COMPARISON:  January 02, 2017 FINDINGS: Cardiomediastinal silhouette is normal. Mediastinal contours appear intact. There is no evidence of focal airspace consolidation, pleural effusion or pneumothorax. Osseous structures are without acute abnormality. Soft tissues are grossly normal. IMPRESSION: No active cardiopulmonary disease. Electronically Signed   By: Ted Mcalpine M.D.   On: 12/09/2019 09:53    Procedures Procedures (including critical care time)  Medications Ordered in UC Medications - No data to display  Initial Impression / Assessment and Plan / UC Course  I have reviewed the triage vital signs and the nursing notes.  Pertinent labs & imaging results that were available during my care of the patient were reviewed by me and considered in my medical decision making (see chart for details).     Chest pain Most likely musculoskeletal in nature. EKG with normal sinus rhythm and sinus bradycardic here today. No concern for ACS at this time.  X-ray without any acute findings. Recommend ibuprofen 600 mg every 8 hours Heat to the chest Stretching Follow up as needed for continued or worsening symptoms  Final Clinical Impressions(s) / UC Diagnoses   Final diagnoses:  Chest pain, unspecified type     Discharge Instructions     Your EKG  and x-ray were  normal. Believe this is musculoskeletal.  You can do ibuprofen 600 mg every 8 hours. Rest, try heat to the area. Follow up as needed for continued or worsening symptoms     ED Prescriptions    Medication Sig Dispense Auth. Provider   ibuprofen (ADVIL) 600 MG tablet Take 1 tablet (600 mg total) by mouth every 8 (eight) hours as needed for moderate pain. 30 tablet Dahlia Byes A, NP     PDMP not reviewed this encounter.   Dahlia Byes A, NP 12/09/19 1040

## 2019-12-28 ENCOUNTER — Other Ambulatory Visit: Payer: Self-pay | Admitting: Obstetrics & Gynecology

## 2020-03-11 ENCOUNTER — Encounter: Payer: Self-pay | Admitting: Obstetrics and Gynecology

## 2020-03-11 ENCOUNTER — Other Ambulatory Visit: Payer: Self-pay

## 2020-03-11 ENCOUNTER — Ambulatory Visit (INDEPENDENT_AMBULATORY_CARE_PROVIDER_SITE_OTHER): Payer: BC Managed Care – PPO | Admitting: Obstetrics and Gynecology

## 2020-03-11 ENCOUNTER — Ambulatory Visit: Payer: BC Managed Care – PPO | Admitting: Obstetrics and Gynecology

## 2020-03-11 VITALS — BP 120/80 | Ht 63.0 in | Wt 177.0 lb

## 2020-03-11 DIAGNOSIS — B9689 Other specified bacterial agents as the cause of diseases classified elsewhere: Secondary | ICD-10-CM | POA: Insufficient documentation

## 2020-03-11 DIAGNOSIS — N760 Acute vaginitis: Secondary | ICD-10-CM

## 2020-03-11 DIAGNOSIS — R350 Frequency of micturition: Secondary | ICD-10-CM | POA: Diagnosis not present

## 2020-03-11 LAB — POCT URINALYSIS DIPSTICK
Bilirubin, UA: NEGATIVE
Blood, UA: NEGATIVE
Glucose, UA: NEGATIVE
Ketones, UA: NEGATIVE
Leukocytes, UA: NEGATIVE
Nitrite, UA: NEGATIVE
Protein, UA: NEGATIVE
Spec Grav, UA: 1.02 (ref 1.010–1.025)
pH, UA: 6 (ref 5.0–8.0)

## 2020-03-11 LAB — POCT WET PREP WITH KOH
Clue Cells Wet Prep HPF POC: POSITIVE
KOH Prep POC: POSITIVE — AB
Trichomonas, UA: NEGATIVE
Yeast Wet Prep HPF POC: NEGATIVE

## 2020-03-11 MED ORDER — METRONIDAZOLE 500 MG PO TABS: 500.0000 mg | ORAL_TABLET | Freq: Two times a day (BID) | ORAL | 0 refills | Status: AC

## 2020-03-11 NOTE — Progress Notes (Signed)
Default, Provider, MD   Chief Complaint  Patient presents with  . Urinary Tract Infection    urinary frequency, odor x 1 week  . Vaginal Discharge    some itching, some fishy odor x 1 week    HPI:      [name] is a 29 y.o. G0P0000 whose LMP was Patient's last menstrual period was 02/16/2020 (exact date)., presents today for increased vag d/c with fishy odor, some irritation, for the past wk. No meds to treat, no hx of BV. No new soaps/detergents, no recent abx use.  Also with urinary frequency with good flow and odor, no dysuria, hematuria, LBP, pelvic pain, fevers. Has been drinking more sodas recently. Hx of E. Coli on 6/21 C&S. Pt is sex active, no new partners.   Past Medical History:  Diagnosis Date  . Asthma    teenager  . Dysmenorrhea   . History of kidney stones   . Left ovarian cyst 08/2019  . Sleep apnea 2017   does not wear cpap    Past Surgical History:  Procedure Laterality Date  . CYSTOSCOPY  08/08/2019  . LAPAROSCOPIC OVARIAN CYSTECTOMY Left 08/15/2019   Procedure: LAPAROSCOPIC OVARIAN CYSTECTOMY;  Surgeon: Nadara Mustard, MD;  Location: [facility] ORS;  Service: Gynecology;  Laterality: Left;  . WISDOM TOOTH EXTRACTION  07/17/2016    Family History  Problem Relation Age of Onset  . Thyroid disease Mother   . Hypertension Brother   . Liver disease Brother   . Colon cancer Maternal Aunt 52  . Diabetes Father   . Hypertension Father   . Heart Problems Father     Social History   Socioeconomic History  . Marital status: Legally Separated    Spouse name: Not on file  . Number of children: 0  . Years of education: Not on file  . Highest education level: Not on file  Occupational History  . Not on file  Tobacco Use  . Smoking status: Current Every Day Smoker    Packs/day: 0.10    Types: Cigarettes  . Smokeless tobacco: Never Used  Vaping Use  . Vaping Use: Never used  Substance and Sexual Activity  . Alcohol use: Yes    Comment:  Occasional  . Drug use: Yes    Types: Marijuana    Comment: over 10 years ago  . Sexual activity: Yes    Birth control/protection: Pill  Other Topics Concern  . Not on file  Social History Narrative  . Not on file   Social Determinants of Health   Financial Resource Strain:   . Difficulty of Paying Living Expenses: Not on file  Food Insecurity:   . Worried About Programme researcher, broadcasting/film/video in the Last Year: Not on file  . Ran Out of Food in the Last Year: Not on file  Transportation Needs:   . Lack of Transportation (Medical): Not on file  . Lack of Transportation (Non-Medical): Not on file  Physical Activity:   . Days of Exercise per Week: Not on file  . Minutes of Exercise per Session: Not on file  Stress:   . Feeling of Stress : Not on file  Social Connections:   . Frequency of Communication with Friends and Family: Not on file  . Frequency of Social Gatherings with Friends and Family: Not on file  . Attends Religious Services: Not on file  . Active Member of Clubs or Organizations: Not on file  . Attends Club or  Organization Meetings: Not on file  . Marital Status: Not on file  Intimate Partner Violence:   . Fear of Current or Ex-Partner: Not on file  . Emotionally Abused: Not on file  . Physically Abused: Not on file  . Sexually Abused: Not on file    Outpatient Medications Prior to Visit  Medication Sig Dispense Refill  . TRI-LO-MILI 0.18/0.215/0.25 MG-25 MCG tab Take 1 tablet by mouth once daily 84 tablet 2  . ibuprofen (ADVIL) 600 MG tablet Take 1 tablet (600 mg total) by mouth every 8 (eight) hours as needed for moderate pain. 30 tablet 0   No facility-administered medications prior to visit.      ROS:  Review of Systems  Constitutional: Negative for fever.  Gastrointestinal: Negative for blood in stool, constipation, diarrhea, nausea and vomiting.  Genitourinary: Positive for frequency and vaginal discharge. Negative for dyspareunia, dysuria, flank pain,  hematuria, urgency, vaginal bleeding and vaginal pain.  Musculoskeletal: Negative for back pain.  Skin: Negative for rash.    OBJECTIVE:   Vitals:  BP 120/80   Ht 5\' 3"  (1.6 m)   Wt 177 lb (80.3 kg)   LMP 02/16/2020 (Exact Date)   BMI 31.35 kg/m   Physical Exam Vitals reviewed.  Constitutional:      Appearance: She is well-developed.  Pulmonary:     Effort: Pulmonary effort is normal.  Genitourinary:    General: Normal vulva.     Pubic Area: No rash.      Labia:        Right: No rash, tenderness or lesion.        Left: No rash, tenderness or lesion.      Vagina: Vaginal discharge present. No erythema or tenderness.     Cervix: Normal.     Uterus: Normal. Not enlarged and not tender.      Adnexa: Right adnexa normal and left adnexa normal.       Right: No mass or tenderness.         Left: No mass or tenderness.    Musculoskeletal:        General: Normal range of motion.     Cervical back: Normal range of motion.  Skin:    General: Skin is warm and dry.  Neurological:     General: No focal deficit present.     Mental Status: She is alert and oriented to person, place, and time.  Psychiatric:        Mood and Affect: Mood normal.        Behavior: Behavior normal.        Thought Content: Thought content normal.        Judgment: Judgment normal.     Results: Results for orders placed or performed in visit on 03/11/20 (from the past 24 hour(s))  POCT Wet Prep with KOH     Status: Abnormal   Collection Time: 03/11/20  4:15 PM  Result Value Ref Range   Trichomonas, UA Negative    Clue Cells Wet Prep HPF POC pos    Epithelial Wet Prep HPF POC     Yeast Wet Prep HPF POC neg    Bacteria Wet Prep HPF POC     RBC Wet Prep HPF POC     WBC Wet Prep HPF POC     KOH Prep POC Positive (A) Negative  POCT Urinalysis Dipstick     Status: Normal   Collection Time: 03/11/20  4:15 PM  Result Value Ref Range  Color, UA yellow    Clarity, UA cloudy    Glucose, UA Negative  Negative   Bilirubin, UA neg    Ketones, UA neg    Spec Grav, UA 1.020 1.010 - 1.025   Blood, UA neg    pH, UA 6.0 5.0 - 8.0   Protein, UA Negative Negative   Urobilinogen, UA     Nitrite, UA neg    Leukocytes, UA Negative Negative   Appearance     Odor       Assessment/Plan: BV (bacterial vaginosis) - Plan: POCT Wet Prep with KOH, metroNIDAZOLE (FLAGYL) 500 MG tablet--pos sx and wet prep. Rx flagly, no EtOH. Will RF if sx recur. F/u prn.   Urinary frequency - Plan: POCT Urinalysis Dipstick; neg UA. Most likely due to caffeine--d/c, increase water. F/u prn.     Meds ordered this encounter  Medications  . metroNIDAZOLE (FLAGYL) 500 MG tablet    Sig: Take 1 tablet (500 mg total) by mouth 2 (two) times daily for 7 days.    Dispense:  14 tablet    Refill:  0    Order Specific Question:   Supervising Provider    Answer:   Nadara Mustard [[id number]]      Return if symptoms worsen or fail to improve.  Keaton Stirewalt B. Maxie Slovacek, PA-C 03/11/2020 4:18 PM

## 2020-03-11 NOTE — Patient Instructions (Signed)
I value your feedback and entrusting us with your care. If you get a [facility] patient survey, I would appreciate you taking the time to let us know about your experience today. Thank you!  As of March 14, 2019, your lab results will be released to your MyChart immediately, before I even have a chance to see them. Please give me time to review them and contact you if there are any abnormalities. Thank you for your patience.  

## 2020-03-11 NOTE — Patient Instructions (Incomplete)
I value your feedback and entrusting us with your care. If you get a [facility] patient survey, I would appreciate you taking the time to let us know about your experience today. Thank you!  As of March 14, 2019, your lab results will be released to your MyChart immediately, before I even have a chance to see them. Please give me time to review them and contact you if there are any abnormalities. Thank you for your patience.  

## 2020-03-11 NOTE — Progress Notes (Unsigned)
Default, Provider, MD   No chief complaint on file.   HPI:      Carol Carter is a 29 y.o. G0P0000 whose LMP was No LMP recorded. (Menstrual status: Oral contraceptives)., presents today for ***  E/coli on 6/21 C&S S/p LTO cystectomy  Past Medical History:  Diagnosis Date  . Asthma    teenager  . Dysmenorrhea   . History of kidney stones   . Left ovarian cyst 08/2019  . Sleep apnea 2017   does not wear cpap    Past Surgical History:  Procedure Laterality Date  . CYSTOSCOPY  08/08/2019  . LAPAROSCOPIC OVARIAN CYSTECTOMY Left 08/15/2019   Procedure: LAPAROSCOPIC OVARIAN CYSTECTOMY;  Surgeon: Nadara Mustard, MD;  Location: [facility];  Service: Gynecology;  Laterality: Left;  . WISDOM TOOTH EXTRACTION  07/17/2016    Family History  Problem Relation Age of Onset  . Thyroid disease Mother   . Hypertension Brother   . Liver disease Brother   . Colon cancer Maternal Aunt 52  . Diabetes Father   . Hypertension Father   . Heart Problems Father     Social History   Socioeconomic History  . Marital status: Legally Separated    Spouse name: Not on file  . Number of children: 0  . Years of education: Not on file  . Highest education level: Not on file  Occupational History  . Not on file  Tobacco Use  . Smoking status: Current Every Day Smoker    Packs/day: 0.10    Types: Cigarettes  . Smokeless tobacco: Never Used  Vaping Use  . Vaping Use: Never used  Substance and Sexual Activity  . Alcohol use: Yes    Comment: Occasional  . Drug use: Yes    Types: Marijuana    Comment: over 10 years ago  . Sexual activity: Yes  Other Topics Concern  . Not on file  Social History Narrative  . Not on file   Social Determinants of Health   Financial Resource Strain:   . Difficulty of Paying Living Expenses: Not on file  Food Insecurity:   . Worried About Programme researcher, broadcasting/film/video in the Last Year: Not on file  . Ran Out of Food in the Last Year: Not on file   Transportation Needs:   . Lack of Transportation (Medical): Not on file  . Lack of Transportation (Non-Medical): Not on file  Physical Activity:   . Days of Exercise per Week: Not on file  . Minutes of Exercise per Session: Not on file  Stress:   . Feeling of Stress : Not on file  Social Connections:   . Frequency of Communication with Friends and Family: Not on file  . Frequency of Social Gatherings with Friends and Family: Not on file  . Attends Religious Services: Not on file  . Active Member of Clubs or Organizations: Not on file  . Attends Banker Meetings: Not on file  . Marital Status: Not on file  Intimate Partner Violence:   . Fear of Current or Ex-Partner: Not on file  . Emotionally Abused: Not on file  . Physically Abused: Not on file  . Sexually Abused: Not on file    Outpatient Medications Prior to Visit  Medication Sig Dispense Refill  . ibuprofen (ADVIL) 600 MG tablet Take 1 tablet (600 mg total) by mouth every 8 (eight) hours as needed for moderate pain. 30 tablet 0  . TRI-LO-MILI 0.18/0.215/0.25 MG-25 MCG  tab Take 1 tablet by mouth once daily 84 tablet 2   No facility-administered medications prior to visit.      ROS:  Review of Systems BREAST: No symptoms   OBJECTIVE:   Vitals:  There were no vitals taken for this visit.  Physical Exam  Results: No results found for this or any previous visit (from the past 24 hour(s)).   Assessment/Plan: No diagnosis found.    No orders of the defined types were placed in this encounter.     No follow-ups on file.  Sandor Arboleda B. Mylen Mangan, PA-C 03/11/2020 11:00 AM

## 2020-03-12 ENCOUNTER — Ambulatory Visit
Admission: EM | Admit: 2020-03-12 | Discharge: 2020-03-12 | Disposition: A | Discharge status: Home / Self Care | Payer: BC Managed Care – PPO | Attending: Urgent Care | Admitting: Urgent Care

## 2020-03-12 ENCOUNTER — Other Ambulatory Visit: Payer: Self-pay

## 2020-03-12 ENCOUNTER — Encounter: Payer: Self-pay | Admitting: Emergency Medicine

## 2020-03-12 DIAGNOSIS — Z0189 Encounter for other specified special examinations: Secondary | ICD-10-CM | POA: Diagnosis not present

## 2020-03-12 DIAGNOSIS — J069 Acute upper respiratory infection, unspecified: Secondary | ICD-10-CM

## 2020-03-12 DIAGNOSIS — R0981 Nasal congestion: Secondary | ICD-10-CM

## 2020-03-12 MED ORDER — PROMETHAZINE-DM 6.25-15 MG/5ML PO SYRP
5.0000 mL | ORAL_SOLUTION | Freq: Every evening | ORAL | 0 refills | Status: DC | PRN
Start: 2020-03-12 — End: 2020-09-01

## 2020-03-12 MED ORDER — PSEUDOEPHEDRINE HCL 60 MG PO TABS
60.0000 mg | ORAL_TABLET | Freq: Three times a day (TID) | ORAL | 0 refills | Status: DC | PRN
Start: 2020-03-12 — End: 2020-09-01

## 2020-03-12 MED ORDER — CETIRIZINE HCL 10 MG PO TABS
10.0000 mg | ORAL_TABLET | Freq: Every day | ORAL | 0 refills | Status: DC
Start: 2020-03-12 — End: 2020-09-01

## 2020-03-12 MED ORDER — BENZONATATE 100 MG PO CAPS: 100.0000 mg | ORAL_CAPSULE | Freq: Three times a day (TID) | ORAL | 0 refills | Status: DC | PRN

## 2020-03-12 NOTE — ED Triage Notes (Signed)
Pt presents with nasal congestion and sinus pressure x 1 day.  OTC meds yesterday did not help.  Temp of 99.8 at home.  No meds today.  Denies coughing, SOB.  Requests COVID and flu testing.    No COVID vaccines.

## 2020-03-12 NOTE — Discharge Instructions (Addendum)

## 2020-03-12 NOTE — ED Triage Notes (Signed)
Patient in office today c/o Nasal congestion ,slight Headache and fever x1d  OTC: none   Denies N/V

## 2020-03-12 NOTE — ED Provider Notes (Signed)
Carol Carter   MRN: [id number] DOB: 1991-03-15  Subjective:   Carol Carter is a 29 y.o. female presenting for a history of acute onset sinus congestion.  Patient does have a history of asthma and is a smoker.  Denies having needed an inhaler for a few years now.  Has not had COVID vaccination.  Denies ear pain, throat pain, coughing, chest pain, shortness of breath, wheezing, body aches, loss of sense of taste and smell.  No current facility-administered medications for this encounter.  Current Outpatient Medications:  .  cetirizine (ZYRTEC ALLERGY) 10 MG tablet, Take 1 tablet (10 mg total) by mouth daily., Disp: 30 tablet, Rfl: 0 .  metroNIDAZOLE (FLAGYL) 500 MG tablet, Take 1 tablet (500 mg total) by mouth 2 (two) times daily for 7 days., Disp: 14 tablet, Rfl: 0 .  pseudoephedrine (SUDAFED) 60 MG tablet, Take 1 tablet (60 mg total) by mouth every 8 (eight) hours as needed for congestion., Disp: 30 tablet, Rfl: 0 .  TRI-LO-MILI 0.18/0.215/0.25 MG-25 MCG tab, Take 1 tablet by mouth once daily, Disp: 84 tablet, Rfl: 2   No Known Allergies  Past Medical History:  Diagnosis Date  . Asthma    teenager  . Dysmenorrhea   . History of kidney stones   . Left ovarian cyst 08/2019  . Sleep apnea 2017   does not wear cpap     Past Surgical History:  Procedure Laterality Date  . CYSTOSCOPY  08/08/2019  . LAPAROSCOPIC OVARIAN CYSTECTOMY Left 08/15/2019   Procedure: LAPAROSCOPIC OVARIAN CYSTECTOMY;  Surgeon: Nadara Mustard, MD;  Location: [facility] ORS;  Service: Gynecology;  Laterality: Left;  . WISDOM TOOTH EXTRACTION  07/17/2016    Family History  Problem Relation Age of Onset  . Thyroid disease Mother   . Hypertension Brother   . Liver disease Brother   . Colon cancer Maternal Aunt 52  . Diabetes Father   . Hypertension Father   . Heart Problems Father     Social History   Tobacco Use  . Smoking status: Current Every Day Smoker    Packs/day: 0.10    Types:  Cigarettes  . Smokeless tobacco: Never Used  Vaping Use  . Vaping Use: Never used  Substance Use Topics  . Alcohol use: Yes    Comment: Occasional  . Drug use: Yes    Types: Marijuana    Comment: over 10 years ago    ROS   Objective:   Vitals: Ht 5\' 3"  (1.6 m)   Wt 178 lb (80.7 kg)   LMP 02/16/2020 (Exact Date)   BMI 31.53 kg/m   BP Readings from Last 3 Encounters:  03/11/20 120/80  12/09/19 100/64  08/29/19 120/70   Physical Exam Constitutional:      General: She is not in acute distress.    Appearance: Normal appearance. She is well-developed. She is obese. She is not ill-appearing, toxic-appearing or diaphoretic.  HENT:     Head: Normocephalic and atraumatic.     Nose: Nose normal.     Mouth/Throat:     Mouth: Mucous membranes are moist.  Eyes:     Extraocular Movements: Extraocular movements intact.     Pupils: Pupils are equal, round, and reactive to light.  Cardiovascular:     Rate and Rhythm: Normal rate and regular rhythm.     Pulses: Normal pulses.     Heart sounds: Normal heart sounds. No murmur heard. No friction rub. No gallop.   Pulmonary:  Effort: Pulmonary effort is normal. No respiratory distress.     Breath sounds: Normal breath sounds. No stridor. No wheezing, rhonchi or rales.  Skin:    General: Skin is warm and dry.     Findings: No rash.  Neurological:     Mental Status: She is alert and oriented to person, place, and time.  Psychiatric:        Mood and Affect: Mood normal.        Behavior: Behavior normal.        Thought Content: Thought content normal.        Judgment: Judgment normal.     Results for orders placed or performed in visit on 03/11/20 (from the past 24 hour(s))  POCT Wet Prep with KOH     Status: Abnormal   Collection Time: 03/11/20  4:15 PM  Result Value Ref Range   Trichomonas, UA Negative    Clue Cells Wet Prep HPF POC pos    Epithelial Wet Prep HPF POC     Yeast Wet Prep HPF POC neg    Bacteria Wet Prep  HPF POC     RBC Wet Prep HPF POC     WBC Wet Prep HPF POC     KOH Prep POC Positive (A) Negative  POCT Urinalysis Dipstick     Status: Normal   Collection Time: 03/11/20  4:15 PM  Result Value Ref Range   Color, UA yellow    Clarity, UA cloudy    Glucose, UA Negative Negative   Bilirubin, UA neg    Ketones, UA neg    Spec Grav, UA 1.020 1.010 - 1.025   Blood, UA neg    pH, UA 6.0 5.0 - 8.0   Protein, UA Negative Negative   Urobilinogen, UA     Nitrite, UA neg    Leukocytes, UA Negative Negative   Appearance     Odor      Assessment and Plan :   PDMP not reviewed this encounter.  1. Viral URI   2. Sinus congestion     Will manage for viral illness such as viral URI, viral syndrome, viral rhinitis, COVID-19. Counseled patient on nature of COVID-19 including modes of transmission, diagnostic testing, management and supportive care.  Offered scripts for symptomatic relief. COVID 19 testing is pending. Counseled patient on potential for adverse effects with medications prescribed/recommended today, ER and return-to-clinic precautions discussed, patient verbalized understanding.     Wallis Bamberg, PA-C 03/12/20 1005

## 2020-03-14 LAB — COVID-19, FLU A+B NAA
Influenza A, NAA: NOT DETECTED
Influenza B, NAA: NOT DETECTED
SARS-CoV-2, NAA: NOT DETECTED

## 2020-03-18 ENCOUNTER — Telehealth: Payer: Self-pay

## 2020-03-18 NOTE — Telephone Encounter (Signed)
Spoke w/patient to clarify medication that was thrown away. She advised metronidazole. Advised since majority was taken, just monitor symptoms and contact us if she has a reoccurance.

## 2020-03-18 NOTE — Telephone Encounter (Signed)
Patient threw away her last pill by accident. Inquiring what to do. Cb#[phone]

## 2020-04-13 ENCOUNTER — Other Ambulatory Visit: Payer: Self-pay

## 2020-04-13 ENCOUNTER — Ambulatory Visit
Admission: EM | Admit: 2020-04-13 | Discharge: 2020-04-13 | Disposition: A | Discharge status: Home / Self Care | Payer: 59 | Attending: Family Medicine | Admitting: Family Medicine

## 2020-04-13 DIAGNOSIS — L0291 Cutaneous abscess, unspecified: Secondary | ICD-10-CM

## 2020-04-13 MED ORDER — DOXYCYCLINE HYCLATE 100 MG PO CAPS
100.0000 mg | ORAL_CAPSULE | Freq: Two times a day (BID) | ORAL | 0 refills | Status: AC
Start: 2020-04-13 — End: 2020-04-20

## 2020-04-13 NOTE — Discharge Instructions (Addendum)
Take the antibiotics as prescribed. Keep area covered for drainage Follow up as needed for continued or worsening symptoms

## 2020-04-13 NOTE — ED Triage Notes (Signed)
Pt c/o swelling, tenderness, possible abscess to right axilla for approx 1 week. Denies drainage from site, fever.  Has been applying topical icthamol w/o improvement.  Large area of edema with soft fluctuance approx 3 cm x 2 cm noted to anterior right axilla.

## 2020-04-14 DIAGNOSIS — L0291 Cutaneous abscess, unspecified: Secondary | ICD-10-CM

## 2020-04-14 NOTE — ED Provider Notes (Signed)
[name]    CSN: [id number] Arrival date & time: 04/13/20  1453      History   Chief Complaint Chief Complaint  Patient presents with  . Abscess    HPI Carol Carter is a 30 y.o. female.   Patient is a 30 year old female presents today with abscess to right axilla area.  This has been worsening over the past week.  Swelling, tenderness.  No drainage.  No fever, chills.  Has been applying topical ointment without any improvement.     Past Medical History:  Diagnosis Date  . Asthma    teenager  . Dysmenorrhea   . History of kidney stones   . Left ovarian cyst 08/2019  . Sleep apnea 2017   does not wear cpap    Patient Active Problem List   Diagnosis Date Noted  . BV (bacterial vaginosis) 03/11/2020  . Left ovarian cyst   . 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 Dysmenorrhea 09/22/2017  . Pelvic pain 09/15/2017  . Irregular intermenstrual bleeding 09/15/2017    Past Surgical History:  Procedure Laterality Date  . CYSTOSCOPY  08/08/2019  . LAPAROSCOPIC OVARIAN CYSTECTOMY Left 08/15/2019   Procedure: LAPAROSCOPIC OVARIAN CYSTECTOMY;  Surgeon: Nadara Mustard, MD;  Location: [facility] ORS;  Service: Gynecology;  Laterality: Left;  . WISDOM TOOTH EXTRACTION  07/17/2016    OB History    Gravida  0   Para  0   Term  0   Preterm  0   AB  0   Living  0     SAB  0   IAB  0   Ectopic  0   Multiple  0   Live Births  0            Home Medications    Prior to Admission medications   Medication Sig Start Date End Date Taking? Authorizing Provider  cetirizine (ZYRTEC ALLERGY) 10 MG tablet Take 1 tablet (10 mg total) by mouth daily. 03/12/20  Yes Wallis Bamberg, PA-C  doxycycline (VIBRAMYCIN) 100 MG capsule Take 1 capsule (100 mg total) by mouth 2 (two) times daily for 7 days. 04/13/20 04/20/20 Yes Shawnise Peterkin A, NP  promethazine-dextromethorphan (PROMETHAZINE-DM) 6.25-15 MG/5ML syrup Take 5 mLs by mouth at bedtime as needed for cough.  03/12/20  Yes Wallis Bamberg, PA-C  TRI-LO-MILI 0.18/0.215/0.25 MG-25 MCG tab Take 1 tablet by mouth once daily 12/29/19  Yes Nadara Mustard, MD  pseudoephedrine (SUDAFED) 60 MG tablet Take 1 tablet (60 mg total) by mouth every 8 (eight) hours as needed for congestion. 03/12/20   Wallis Bamberg, PA-C    Family History Family History  Problem Relation Age of Onset  . Thyroid disease Mother   . Hypertension Brother   . Liver disease Brother   . Colon cancer Maternal Aunt 52  . Diabetes Father   . Hypertension Father   . Heart Problems Father     Social History Social History   Tobacco Use  . Smoking status: Current Every Day Smoker    Packs/day: 0.10    Types: Cigarettes  . Smokeless tobacco: Never Used  Vaping Use  . Vaping Use: Never used  Substance Use Topics  . Alcohol use: Yes    Comment: Occasional  . Drug use: Yes    Types: Marijuana    Comment: over 10 years ago     Allergies   Patient has no known allergies.   Review of Systems Review of Systems  Physical Exam Triage Vital Signs ED Triage Vitals  Enc Vitals Group     BP 04/13/20 1501 110/78     Pulse Rate 04/13/20 1501 (!) 101     Resp 04/13/20 1501 16     Temp 04/13/20 1501 98.7 F (37.1 C)     Temp Source 04/13/20 1501 Oral     SpO2 04/13/20 1501 99 %     Weight --      Height --      Head Circumference --      Peak Flow --      Pain Score 04/13/20 1505 10     Pain Loc --      Pain Edu? --      Excl. in GC? --    No data found.  Updated Vital Signs BP 110/78 (BP Location: Left Arm)   Pulse (!) 101   Temp 98.7 F (37.1 C) (Oral)   Resp 16   LMP 03/19/2020   SpO2 99%   Visual Acuity Right Eye Distance:   Left Eye Distance:   Bilateral Distance:    Right Eye Near:   Left Eye Near:    Bilateral Near:     Physical Exam Vitals and nursing note reviewed.  Constitutional:      General: She is not in acute distress.    Appearance: Normal appearance. She is not ill-appearing,  toxic-appearing or diaphoretic.  HENT:     Head: Normocephalic.     Nose: Nose normal.  Eyes:     Conjunctiva/sclera: Conjunctivae normal.  Pulmonary:     Effort: Pulmonary effort is normal.  Musculoskeletal:        General: Normal range of motion.     Cervical back: Normal range of motion.  Skin:    General: Skin is warm and dry.     Findings: No rash.     Comments: Approximated 3 to 4 cm abscess to right axilla area. 2 cm fluctuant.  No drainage.  Surrounding erythema and induration.  Neurological:     Mental Status: She is alert.  Psychiatric:        Mood and Affect: Mood normal.      UC Treatments / Results  Labs (all labs ordered are listed, but only abnormal results are displayed) Labs Reviewed - No data to display  EKG   Radiology No results found.  Procedures Incision and Drainage  Date/Time: 04/14/2020 8:16 AM Performed by: Janace Aris, NP Authorized by: Janace Aris, NP   Consent:    Consent obtained:  Verbal   Consent given by:  Patient   Risks discussed:  Bleeding, incomplete drainage and pain Universal protocol:    Patient identity confirmed:  Verbally with patient Location:    Type:  Abscess   Size:  4   Location:  Upper extremity   Upper extremity location:  Arm   Arm location:  R upper arm Pre-procedure details:    Skin preparation:  Povidone-iodine Sedation:    Sedation type:  None Anesthesia:    Anesthesia method:  Local infiltration   Local anesthetic:  Lidocaine 1% w/o epi Procedure type:    Complexity:  Simple Procedure details:    Incision types:  Stab incision   Wound management:  Probed and deloculated   Drainage:  Bloody and purulent   Drainage amount:  Copious   Wound treatment:  Wound left open   Packing materials:  None   (including critical care time)  Medications Ordered in UC  Medications - No data to display  Initial Impression / Assessment and Plan / UC Course  I have reviewed the triage vital signs and the  nursing notes.  Pertinent labs & imaging results that were available during my care of the patient were reviewed by me and considered in my medical decision making (see chart for details).     Abscess I&D done here in clinic today.  Patient tolerated well. Doxycycline for antibiotic coverage Follow up as needed for continued or worsening symptoms   Final Clinical Impressions(s) / UC Diagnoses   Final diagnoses:  Abscess     Discharge Instructions     Take the antibiotics as prescribed. Keep area covered for drainage Follow up as needed for continued or worsening symptoms     ED Prescriptions    Medication Sig Dispense Auth. Provider   doxycycline (VIBRAMYCIN) 100 MG capsule Take 1 capsule (100 mg total) by mouth 2 (two) times daily for 7 days. 14 capsule Yisel Megill A, NP     PDMP not reviewed this encounter.   Janace Aris, NP 04/14/20 [phone]

## 2020-05-24 ENCOUNTER — Encounter: Payer: Self-pay | Admitting: Obstetrics and Gynecology

## 2020-06-21 IMAGING — CT CT RENAL STONE PROTOCOL
2 of 4 series · 16 of 46 positions shown, 18 images · non-contrast
Comparison: None.

CLINICAL DATA: Flank pain. Evaluate for kidney stone.

EXAM:
CT ABDOMEN AND PELVIS WITHOUT CONTRAST
TECHNIQUE: Multidetector CT imaging of the abdomen and pelvis was performed
following the standard protocol without IV contrast.

[Series 3: axials low dose 1 5.00 · axial · 0.66mm/px · z∈[-1485,-1065]mm · 13 of 92 slices shown, 15 images]
[im 4/92  soft-tissue]
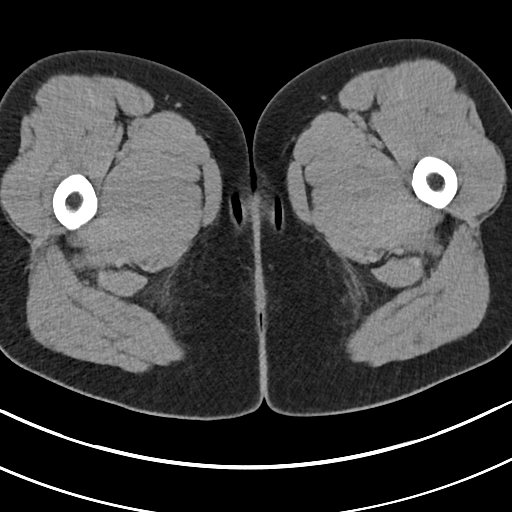
[im 4/92  bone]
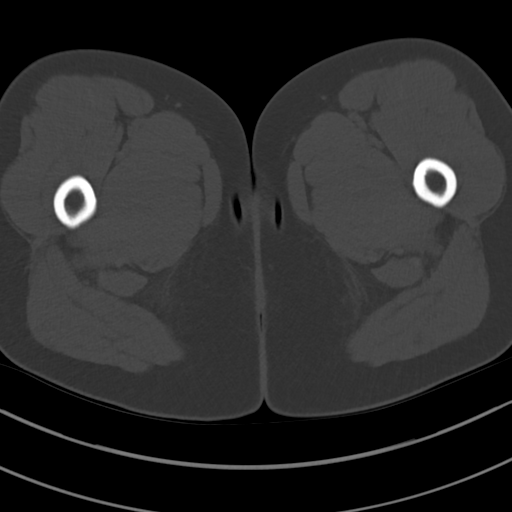
[im 11/92  soft-tissue]
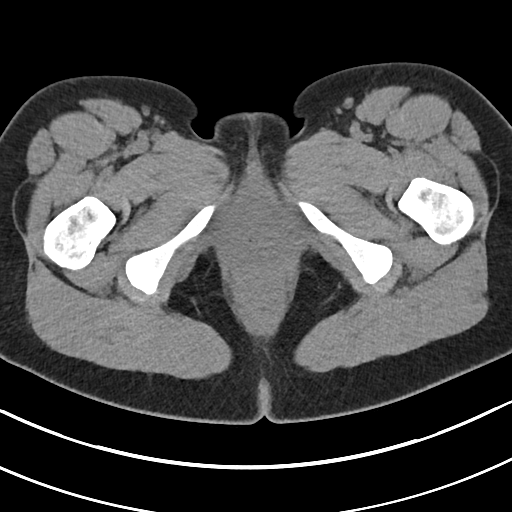
[im 18/92  soft-tissue]
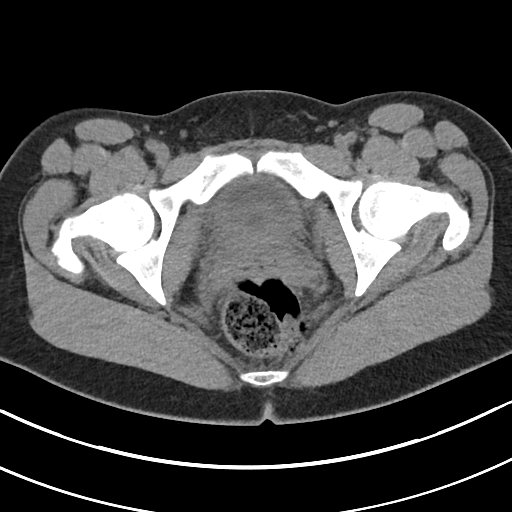
[im 25/92  soft-tissue]
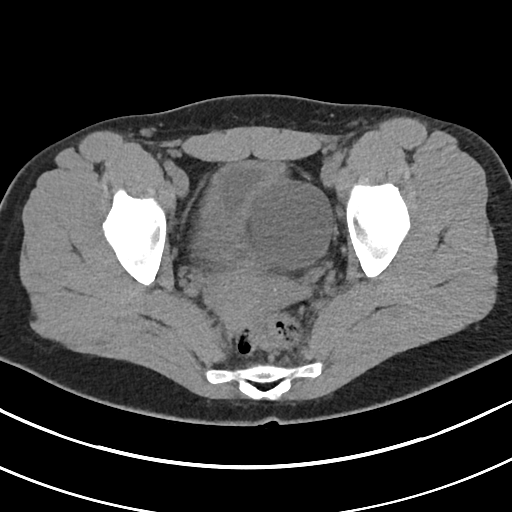
[im 32/92  soft-tissue]
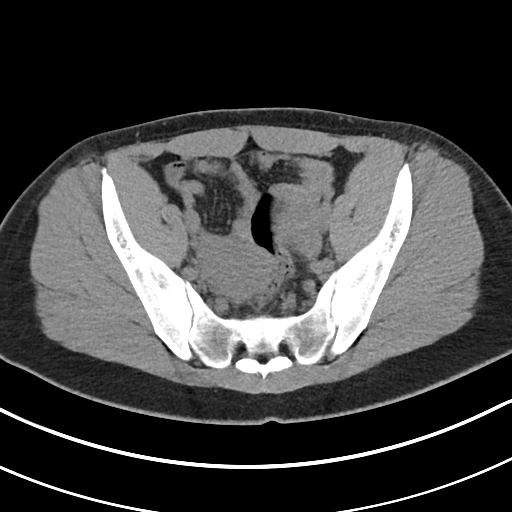
[im 39/92  soft-tissue]
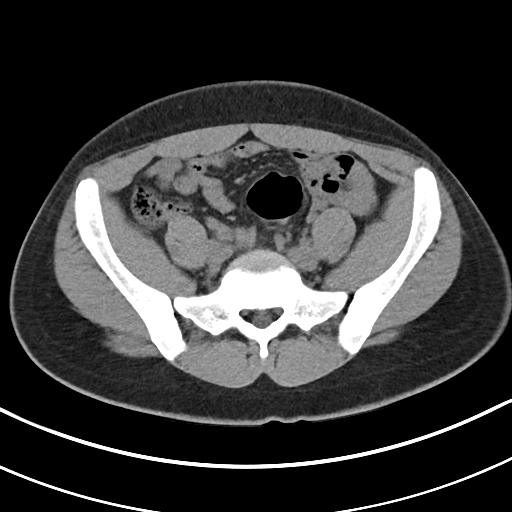
[im 46/92  soft-tissue]
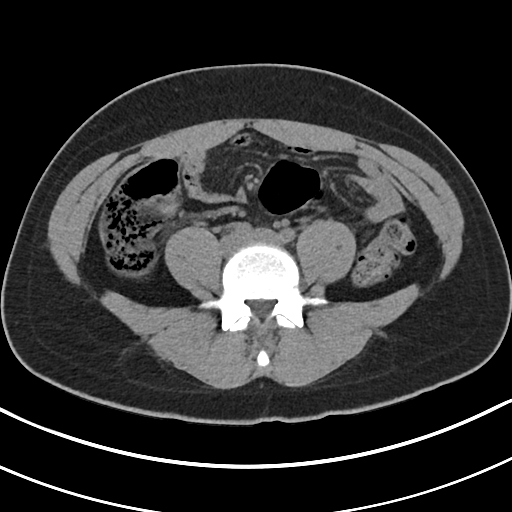
[im 53/92  soft-tissue]
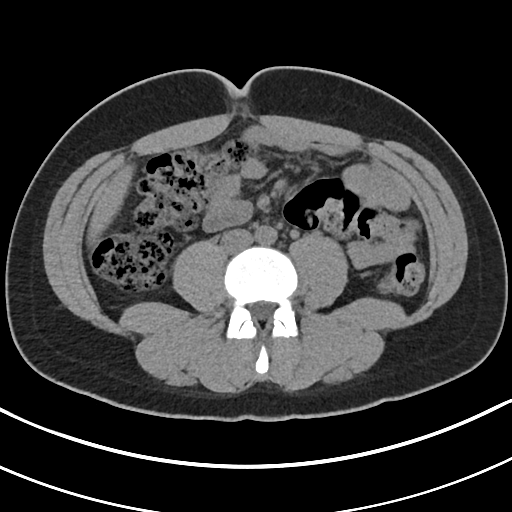
[im 60/92  soft-tissue]
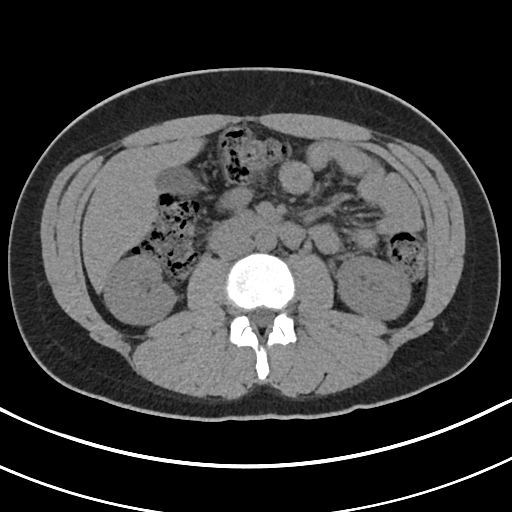
[im 60/92  bone]
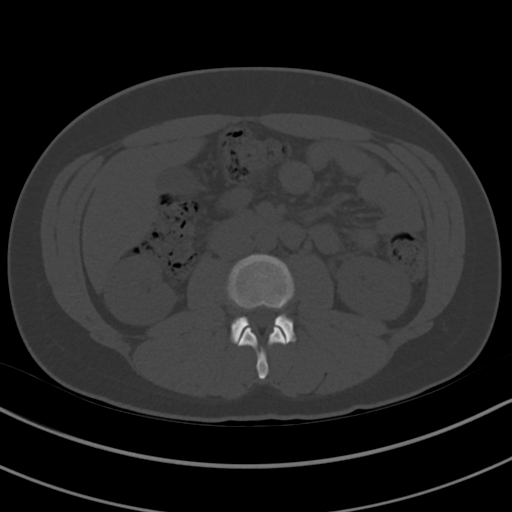
[im 67/92  soft-tissue]
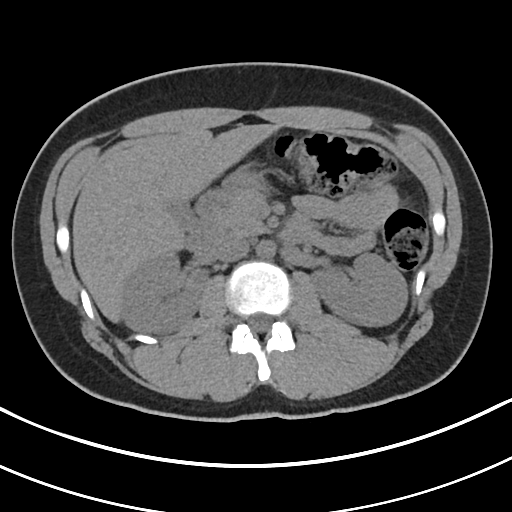
[im 74/92  soft-tissue]
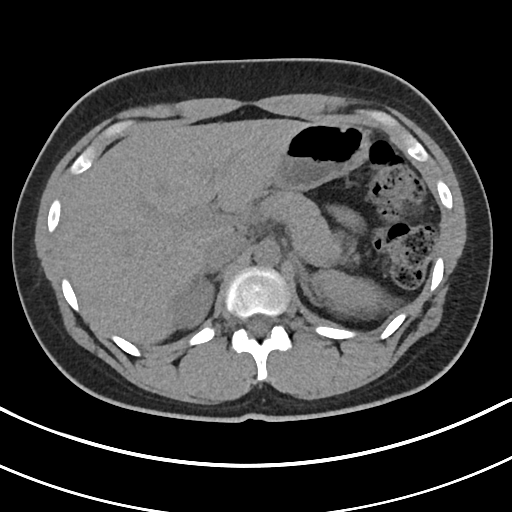
[im 81/92  soft-tissue]
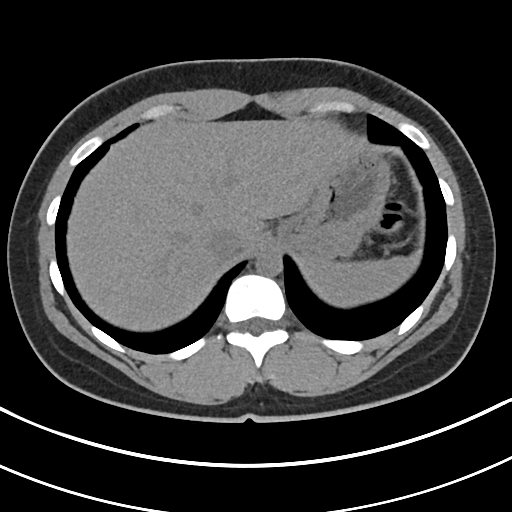
[im 88/92  soft-tissue]
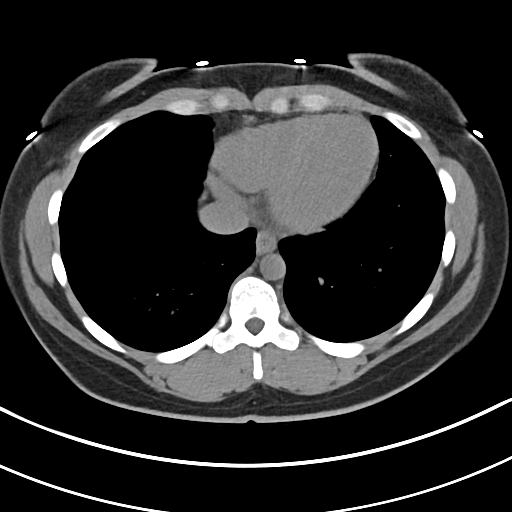

[Series 6: coronals low dose 1 2.00 cor · coronal · 0.66mm/px · 3 of 125 slices shown]
[im 42/125  soft-tissue]
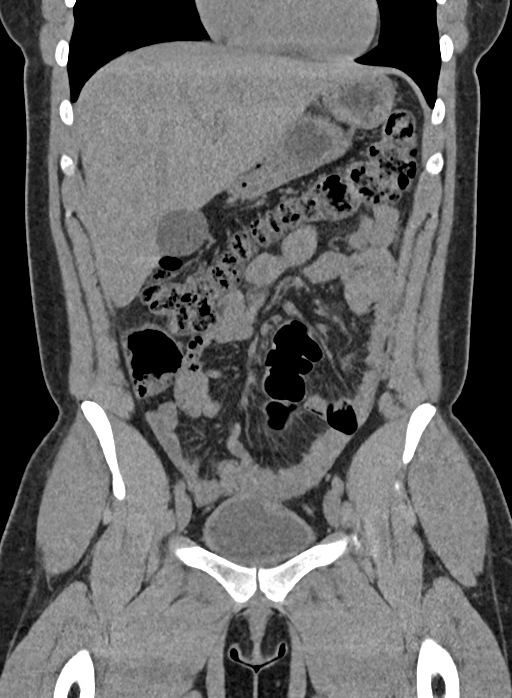
[im 56/125  soft-tissue]
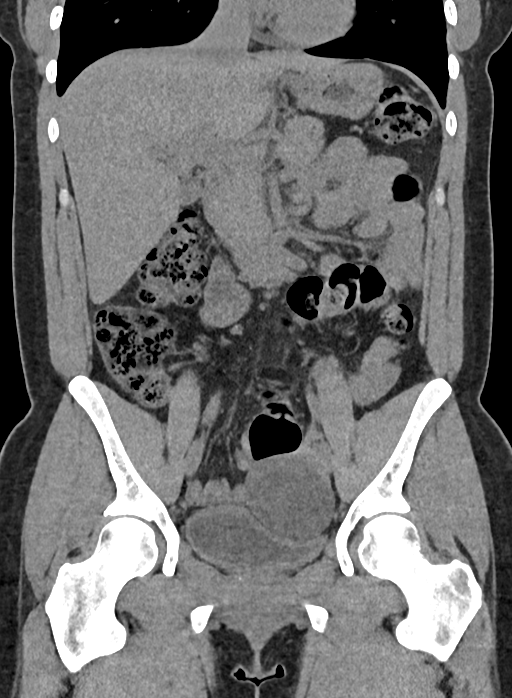
[im 69/125  soft-tissue]
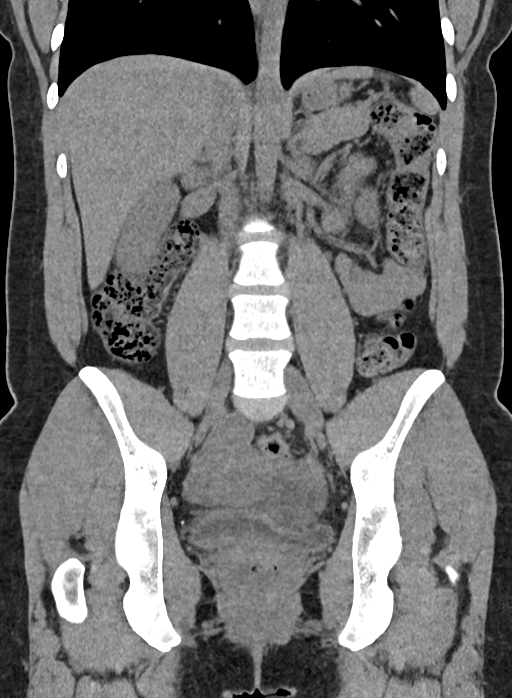

[16 of 46 positions shown; findings below may reference images not displayed]

FINDINGS: Lower chest: No acute abnormality.

Hepatobiliary: No focal liver abnormality identified. The
gallbladder is unremarkable. No biliary dilatation.

Pancreas: Unremarkable. No pancreatic ductal dilatation or
surrounding inflammatory changes.

Spleen: Normal in size without focal abnormality.

Adrenals/Urinary Tract: Normal appearance of the adrenal glands.
Stone within the lower pole of the right kidney measures 3 mm, image
35/3. No left kidney stone. No hydronephrosis identified
bilaterally.

Stomach/Bowel: Stomach is within normal limits. Appendix appears
normal. No evidence of bowel wall thickening, distention, or
inflammatory changes.

Vascular/Lymphatic: No significant vascular findings are present. No
enlarged abdominal or pelvic lymph nodes.

Reproductive: The uterus appears normal. There is a large cystic
structure within the right adnexa which measures 6 x 5.6 by 5.2 cm
(volume = 90 cm^3).

Other: No ascites or focal fluid collections.

Musculoskeletal: No acute or significant osseous findings.
IMPRESSION: 1. Nonobstructing right renal calculus.
2. Large, 90 CC cystic structure is identified within the right
adnexa. This has a maximum dimension of 6 cm. Further evaluation
with pelvic sonogram is advised. This recommendation is from:
Managing Incidental Findings on Abdominal and Pelvic CT and MRI:
Part 1: White Paper of the ACR Incidental Findings Committee II on

## 2020-07-14 NOTE — Telephone Encounter (Signed)
Should she be seen?

## 2020-08-25 ENCOUNTER — Ambulatory Visit
Admission: RE | Admit: 2020-08-25 | Discharge: 2020-08-25 | Disposition: A | Discharge status: Home / Self Care | Payer: 59 | Source: Ambulatory Visit | Attending: Emergency Medicine | Admitting: Emergency Medicine

## 2020-08-25 ENCOUNTER — Other Ambulatory Visit: Payer: Self-pay

## 2020-08-25 VITALS — BP 116/81 | HR 109 | Temp 99.6°F | Resp 18

## 2020-08-25 DIAGNOSIS — Z1152 Encounter for screening for COVID-19: Secondary | ICD-10-CM | POA: Diagnosis not present

## 2020-08-25 DIAGNOSIS — B349 Viral infection, unspecified: Secondary | ICD-10-CM

## 2020-08-25 NOTE — ED Triage Notes (Signed)
Pt presents with cough, chest congestion, body aches, HA x 2 days.  Alka Seltzer was helping, none taken today.

## 2020-08-25 NOTE — ED Provider Notes (Signed)
Carol Carter    CSN: [id number] Arrival date & time: 08/25/20  1408      History   Chief Complaint Chief Complaint  Patient presents with  . Cough    HPI Carol Carter is a 30 y.o. female.   Patient presents with 2-day history of headache, congestion, cough, body aches.  She denies fever at home.  She denies shortness of breath, vomiting, diarrhea, or other symptoms.  Treatment at home with Alka-Seltzer plus cold medicine.  Her medical history includes asthma and kidney stones.  The history is provided by the patient and medical records.    Past Medical History:  Diagnosis Date  . Asthma    teenager  . Dysmenorrhea   . History of kidney stones   . Left ovarian cyst 08/2019  . Sleep apnea 2017   does not wear cpap    Patient Active Problem List   Diagnosis Date Noted  . BV (bacterial vaginosis) 03/11/2020  . Left ovarian cyst   . 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 Dysmenorrhea 09/22/2017  . Pelvic pain 09/15/2017  . Irregular intermenstrual bleeding 09/15/2017    Past Surgical History:  Procedure Laterality Date  . CYSTOSCOPY  08/08/2019  . LAPAROSCOPIC OVARIAN CYSTECTOMY Left 08/15/2019   Procedure: LAPAROSCOPIC OVARIAN CYSTECTOMY;  Surgeon: Nadara Mustard, MD;  Location: [facility] ORS;  Service: Gynecology;  Laterality: Left;  . WISDOM TOOTH EXTRACTION  07/17/2016    OB History    Gravida  0   Para  0   Term  0   Preterm  0   AB  0   Living  0     SAB  0   IAB  0   Ectopic  0   Multiple  0   Live Births  0            Home Medications    Prior to Admission medications   Medication Sig Start Date End Date Taking? Authorizing Provider  TRI-LO-MILI 0.18/0.215/0.25 MG-25 MCG tab Take 1 tablet by mouth once daily 12/29/19  Yes Nadara Mustard, MD  cetirizine (ZYRTEC ALLERGY) 10 MG tablet Take 1 tablet (10 mg total) by mouth daily. 03/12/20   Wallis Bamberg, PA-C  promethazine-dextromethorphan (PROMETHAZINE-DM) 6.25-15  MG/5ML syrup Take 5 mLs by mouth at bedtime as needed for cough. 03/12/20   Wallis Bamberg, PA-C  pseudoephedrine (SUDAFED) 60 MG tablet Take 1 tablet (60 mg total) by mouth every 8 (eight) hours as needed for congestion. 03/12/20   Wallis Bamberg, PA-C    Family History Family History  Problem Relation Age of Onset  . Thyroid disease Mother   . Hypertension Brother   . Liver disease Brother   . Colon cancer Maternal Aunt 52  . Diabetes Father   . Hypertension Father   . Heart Problems Father     Social History Social History   Tobacco Use  . Smoking status: Current Every Day Smoker    Packs/day: 0.10    Types: Cigarettes  . Smokeless tobacco: Never Used  Vaping Use  . Vaping Use: Never used  Substance Use Topics  . Alcohol use: Yes    Comment: Occasional  . Drug use: Yes    Types: Marijuana    Comment: over 10 years ago     Allergies   Patient has no known allergies.   Review of Systems Review of Systems  Constitutional: Negative for chills and fever.  HENT: Positive for congestion. Negative for  ear pain and sore throat.   Respiratory: Positive for cough. Negative for shortness of breath.   Cardiovascular: Negative for chest pain and palpitations.  Gastrointestinal: Negative for abdominal pain, diarrhea and vomiting.  Skin: Negative for color change and rash.  Neurological: Positive for headaches. Negative for weakness and numbness.  All other systems reviewed and are negative.    Physical Exam Triage Vital Signs ED Triage Vitals  Enc Vitals Group     BP      Pulse      Resp      Temp      Temp src      SpO2      Weight      Height      Head Circumference      Peak Flow      Pain Score      Pain Loc      Pain Edu?      Excl. in GC?    No data found.  Updated Vital Signs BP 116/81 (BP Location: Left Arm)   Pulse (!) 109   Temp 99.6 F (37.6 C) (Oral)   Resp 18   LMP 08/13/2020   SpO2 97%   Visual Acuity Right Eye Distance:   Left Eye  Distance:   Bilateral Distance:    Right Eye Near:   Left Eye Near:    Bilateral Near:     Physical Exam Vitals and nursing note reviewed.  Constitutional:      General: She is not in acute distress.    Appearance: She is well-developed.  HENT:     Head: Normocephalic and atraumatic.     Right Ear: Tympanic membrane normal.     Left Ear: Tympanic membrane normal.     Nose: Rhinorrhea present.     Mouth/Throat:     Mouth: Mucous membranes are moist.     Pharynx: Oropharynx is clear.  Eyes:     Conjunctiva/sclera: Conjunctivae normal.  Cardiovascular:     Rate and Rhythm: Normal rate and regular rhythm.     Heart sounds: Normal heart sounds.  Pulmonary:     Effort: Pulmonary effort is normal. No respiratory distress.     Breath sounds: Normal breath sounds.  Abdominal:     Palpations: Abdomen is soft.     Tenderness: There is no abdominal tenderness.  Musculoskeletal:     Cervical back: Neck supple.  Skin:    General: Skin is warm and dry.  Neurological:     General: No focal deficit present.     Mental Status: She is alert and oriented to person, place, and time.  Psychiatric:        Mood and Affect: Mood normal.        Behavior: Behavior normal.      UC Treatments / Results  Labs (all labs ordered are listed, but only abnormal results are displayed) Labs Reviewed  COVID-19, FLU A+B NAA    EKG   Radiology No results found.  Procedures Procedures (including critical care time)  Medications Ordered in UC Medications - No data to display  Initial Impression / Assessment and Plan / UC Course  I have reviewed the triage vital signs and the nursing notes.  Pertinent labs & imaging results that were available during my care of the patient were reviewed by me and considered in my medical decision making (see chart for details).   Viral illness.  Influenza and COVID pending.  Instructed patient to self quarantine  until the test results are back.  Discussed  symptomatic treatment including Tylenol or ibuprofen, rest, hydration.  Instructed patient to follow up with PCP if her symptoms are not improving.  Patient agrees to plan of care.    Final Clinical Impressions(s) / UC Diagnoses   Final diagnoses:  Encounter for screening for COVID-19  Viral illness     Discharge Instructions     Your COVID and Influenza tests are pending.  You should self quarantine until the test results are back.    Take Tylenol or ibuprofen as needed for fever or discomfort.  Rest and keep yourself hydrated.    Follow-up with your primary care provider if your symptoms are not improving.        ED Prescriptions    None     PDMP not reviewed this encounter.   Mickie Bail, NP 08/25/20 [phone]

## 2020-08-25 NOTE — Discharge Instructions (Addendum)
Your COVID and Influenza tests are pending.  You should self quarantine until the test results are back.    Take Tylenol or ibuprofen as needed for fever or discomfort.  Rest and keep yourself hydrated.    Follow-up with your primary care provider if your symptoms are not improving.     

## 2020-08-26 LAB — COVID-19, FLU A+B NAA
Influenza A, NAA: NOT DETECTED
Influenza B, NAA: NOT DETECTED
SARS-CoV-2, NAA: NOT DETECTED

## 2020-09-01 ENCOUNTER — Other Ambulatory Visit: Payer: Self-pay

## 2020-09-01 ENCOUNTER — Other Ambulatory Visit (HOSPITAL_COMMUNITY)
Admission: RE | Admit: 2020-09-01 | Discharge: 2020-09-01 | Disposition: A | Discharge status: Home / Self Care | Payer: 59 | Source: Ambulatory Visit | Attending: Obstetrics & Gynecology | Admitting: Obstetrics & Gynecology

## 2020-09-01 ENCOUNTER — Ambulatory Visit (INDEPENDENT_AMBULATORY_CARE_PROVIDER_SITE_OTHER): Payer: 59 | Admitting: Obstetrics & Gynecology

## 2020-09-01 ENCOUNTER — Encounter: Payer: Self-pay | Admitting: Obstetrics & Gynecology

## 2020-09-01 VITALS — BP 120/70 | Ht 63.0 in | Wt 172.0 lb

## 2020-09-01 DIAGNOSIS — Z01419 Encounter for gynecological examination (general) (routine) without abnormal findings: Secondary | ICD-10-CM | POA: Diagnosis not present

## 2020-09-01 DIAGNOSIS — Z13220 Encounter for screening for lipoid disorders: Secondary | ICD-10-CM

## 2020-09-01 DIAGNOSIS — Z124 Encounter for screening for malignant neoplasm of cervix: Secondary | ICD-10-CM | POA: Diagnosis not present

## 2020-09-01 DIAGNOSIS — Z1329 Encounter for screening for other suspected endocrine disorder: Secondary | ICD-10-CM

## 2020-09-01 DIAGNOSIS — Z131 Encounter for screening for diabetes mellitus: Secondary | ICD-10-CM

## 2020-09-01 MED ORDER — XULANE 150-35 MCG/24HR TD PTWK
1.0000 | MEDICATED_PATCH | TRANSDERMAL | 3 refills | Status: DC
Start: 2020-09-01 — End: 2021-04-16

## 2020-09-01 NOTE — Patient Instructions (Signed)
Thank you for choosing [facility]. As part of our ongoing efforts to improve patient experience, we would appreciate your feedback. Please fill out the short survey that you will receive by mail or MyChart. Your opinion is important to [place]! -Dr Tiburcio Pea  Ethinyl Estradiol; Norelgestromin skin patches What is this medicine? ETHINYL ESTRADIOL;NORELGESTROMIN (ETH in il es tra DYE ole; nor el JES troe min) skin patch is used as a contraceptive (birth control method). This medicine combines two types of female hormones, an estrogen and a progestin. This patch is used to prevent ovulation and pregnancy. This medicine may be used for other purposes; ask your health care provider or pharmacist if you have questions. COMMON BRAND NAME(S): Ortho Christianne Borrow What should I tell my health care provider before I take this medicine? They need to know if you have or ever had any of these conditions:  abnormal vaginal bleeding  blood vessel disease or blood clots  breast, cervical, endometrial, ovarian, liver, or uterine cancer  diabetes  gallbladder disease  having surgery  heart disease or recent heart attack  high blood pressure  high cholesterol or triglycerides  history of irregular heartbeat or heart valve problems  kidney disease  liver disease  migraine headaches  protein C deficiency  protein S deficiency  recently had a baby, miscarriage, or abortion  stroke  systemic lupus erythematosus (SLE)  tobacco smoker  an unusual or allergic reaction to estrogens, progestins, other medicines, foods, dyes, or preservatives  pregnant or trying to get pregnant  breast-feeding How should I use this medicine? This patch is applied to the skin. Follow the directions on the prescription label. Apply to clean, dry, healthy skin on the buttock, abdomen, upper outer arm or upper torso, in a place where it will not be rubbed by tight clothing. Do not use lotions or other cosmetics on  the site where the patch will go. Press the patch firmly in place for 10 seconds to ensure good contact with the skin. Change the patch every 7 days on the same day of the week for 3 weeks. You will then have a break from the patch for 1 week, after which you will apply a new patch. Do not use your medicine more often than directed. Contact your pediatrician regarding the use of this medicine in children. Special care may be needed. This medicine has been used in female children who have started having menstrual periods. A patient package insert for the product will be given with each prescription and refill. Read this sheet carefully each time. The sheet may change frequently. Overdosage: If you think you have taken too much of this medicine contact a poison control center or emergency room at once. NOTE: This medicine is only for you. Do not share this medicine with others. What if I miss a dose? You will need to replace your patch once a week as directed. If your patch is lost or falls off, contact your health care professional for advice. You may need to use another form of birth control if your patch has been off for more than 1 day. What may interact with this medicine? Do not take this medicine with the following medications:  dasabuvir; ombitasvir; paritaprevir; ritonavir  ombitasvir; paritaprevir; ritonavir This medicine may also interact with the following medications:  acetaminophen  antibiotics or medicines for infections, especially rifampin, rifabutin, rifapentine, and possibly penicillins or tetracyclines  aprepitant or fosaprepitant  armodafinil  ascorbic acid (vitamin C)  barbiturate medicines, such as  phenobarbital or primidone  bosentan  certain antiviral medicines for hepatitis, HIV or AIDS  certain medicines for cancer treatment  certain medicines for seizures like carbamazepine, clobazam, felbamate, lamotrigine, oxcarbazepine, phenytoin, rufinamide,  topiramate  certain medicines for treating high cholesterol  cyclosporine  dantrolene  elagolix  flibanserin  grapefruit juice  lesinurad  medicines for diabetes  medicines to treat fungal infections, such as griseofulvin, miconazole, fluconazole, ketoconazole, itraconazole, posaconazole or voriconazole  mifepristone  mitotane  modafinil  morphine  mycophenolate  St. John's wort  tamoxifen  temazepam  theophylline or aminophylline  thyroid hormones  tizanidine  tranexamic acid  ulipristal  warfarin This list may not describe all possible interactions. Give your health care provider a list of all the medicines, herbs, non-prescription drugs, or dietary supplements you use. Also tell them if you smoke, drink alcohol, or use illegal drugs. Some items may interact with your medicine. What should I watch for while using this medicine? Visit your doctor or health care professional for regular checks on your progress. You will need a regular breast and pelvic exam and Pap smear while on this medicine. Use an additional method of contraception during the first cycle that you use this patch. If you have any reason to think you are pregnant, stop using this medicine right away and contact your doctor or health care professional. If you are using this medicine for hormone related problems, it may take several cycles of use to see improvement in your condition. Smoking increases the risk of getting a blood clot or having a stroke while you are using hormonal birth control, especially if you are more than 30 years old. You are strongly advised not to smoke. This medicine can make your body retain fluid, making your fingers, hands, or ankles swell. Your blood pressure can go up. Contact your doctor or health care professional if you feel you are retaining fluid. This medicine can make you more sensitive to the sun. Keep out of the sun. If you cannot avoid being in the sun,  wear protective clothing and use sunscreen. Do not use sun lamps or tanning beds/booths. If you wear contact lenses and notice visual changes, or if the lenses begin to feel uncomfortable, consult your eye care specialist. In some women, tenderness, swelling, or minor bleeding of the gums may occur. Notify your dentist if this happens. Brushing and flossing your teeth regularly may help limit this. See your dentist regularly and inform your dentist of the medicines you are taking. If you are going to have elective surgery or a MRI, you may need to stop using this medicine before the surgery or MRI. Consult your health care professional for advice. This medicine does not protect you against HIV infection (AIDS) or any other sexually transmitted diseases. What side effects may I notice from receiving this medicine? Side effects that you should report to your doctor or health care professional as soon as possible:  allergic reactions such as skin rash or itching, hives, swelling of the lips, mouth, tongue, or throat  breast tissue changes or discharge  dark patches of skin on your forehead, cheeks, upper lip, and chin  depression  high blood pressure  migraines or severe, sudden headaches  missed menstrual periods  signs and symptoms of a blood clot such as breathing problems; changes in vision; chest pain; severe, sudden headache; pain, swelling, warmth in the leg; trouble speaking; sudden numbness or weakness of the face, arm or leg  skin reactions at the patch  site such as blistering, bleeding, itching, rash, or swelling  stomach pain  yellowing of the eyes or skin Side effects that usually do not require medical attention (report these to your doctor or health care professional if they continue or are bothersome):  breast tenderness  irregular vaginal bleeding or spotting, particularly during the first 3 months of use  headache  nausea  painful menstrual periods  skin  redness or mild irritation at site where applied  weight gain (slight) This list may not describe all possible side effects. Call your doctor for medical advice about side effects. You may report side effects to FDA at 1-800-FDA-1088. Where should I keep my medicine? Keep out of the reach of children. Store at room temperature between 15 and 30 degrees C (59 and 86 degrees F). Keep the patch in its pouch until time of use. Throw away any unused medicine after the expiration date. Dispose of used patches properly. Since a used patch may still contain active hormones, fold the patch in half so that it sticks to itself prior to disposal. Throw away in a place where children or pets cannot reach. NOTE: This sheet is a summary. It may not cover all possible information. If you have questions about this medicine, talk to your doctor, pharmacist, or health care provider.  2021 Elsevier/Gold Standard (2018-06-26 11:56:29)

## 2020-09-01 NOTE — Progress Notes (Signed)
HPI:      [name] is a 30 y.o. G0P0000 who LMP was Patient's last menstrual period was 08/13/2020., she presents today for her annual examination. The patient has no complaints today.  Off and on pill use this year (Covid, took some time off; but also misses pills at times).   The patient is sexually active. Her last pap: was normal. The patient does perform self breast exams.  There is no notable family history of breast or ovarian cancer in her family.  The patient has regular exercise: yes.  The patient denies current symptoms of depression.    GYN History: Contraception: OCP (estrogen/progesterone)  PMHx: Past Medical History:  Diagnosis Date  . Asthma    teenager  . Dysmenorrhea   . History of kidney stones   . Left ovarian cyst 08/2019  . Sleep apnea 2017   does not wear cpap   Past Surgical History:  Procedure Laterality Date  . CYSTOSCOPY  08/08/2019  . LAPAROSCOPIC OVARIAN CYSTECTOMY Left 08/15/2019   Procedure: LAPAROSCOPIC OVARIAN CYSTECTOMY;  Surgeon: Nadara Mustard, MD;  Location: [facility] ORS;  Service: Gynecology;  Laterality: Left;  . WISDOM TOOTH EXTRACTION  07/17/2016   Family History  Problem Relation Age of Onset  . Thyroid disease Mother   . Hypertension Brother   . Liver disease Brother   . Colon cancer Maternal Aunt 52  . Diabetes Father   . Hypertension Father   . Heart Problems Father    Social History   Tobacco Use  . Smoking status: Current Every Day Smoker    Packs/day: 0.10    Types: Cigarettes  . Smokeless tobacco: Never Used  Vaping Use  . Vaping Use: Never used  Substance Use Topics  . Alcohol use: Yes    Comment: Occasional  . Drug use: Yes    Types: Marijuana    Comment: over 10 years ago    Current Outpatient Medications:  .  norelgestromin-ethinyl estradiol [facility] [facility]) 150-35 MCG/24HR transdermal patch, Place 1 patch onto the skin once a week. One patch weekly for 3 weeks, then one week off., Disp: 9 patch, Rfl:  3 Allergies: Patient has no known allergies.  Review of Systems  Constitutional: Negative for chills, fever and malaise/fatigue.  HENT: Negative for congestion, sinus pain and sore throat.   Eyes: Negative for blurred vision and pain.  Respiratory: Negative for cough and wheezing.   Cardiovascular: Negative for chest pain and leg swelling.  Gastrointestinal: Negative for abdominal pain, constipation, diarrhea, heartburn, nausea and vomiting.  Genitourinary: Negative for dysuria, frequency, hematuria and urgency.  Musculoskeletal: Negative for back pain, joint pain, myalgias and neck pain.  Skin: Negative for itching and rash.  Neurological: Negative for dizziness, tremors and weakness.  Endo/Heme/Allergies: Does not bruise/bleed easily.  Psychiatric/Behavioral: Negative for depression. The patient is not nervous/anxious and does not have insomnia.     Objective: BP 120/70   Ht 5\' 3"  (1.6 m)   Wt 172 lb (78 kg)   LMP 08/13/2020   BMI 30.47 kg/m   Filed Weights   09/01/20 1502  Weight: 172 lb (78 kg)   Body mass index is 30.47 kg/m. Physical Exam Constitutional:      General: She is not in acute distress.    Appearance: She is well-developed.  Genitourinary:     Rectum normal.     No lesions in the vagina.     No vaginal bleeding.      Right Adnexa: not  tender and no mass present.    Left Adnexa: not tender and no mass present.    No cervical motion tenderness, friability, lesion or polyp.     Uterus is not enlarged.     No uterine mass detected. Breasts:     Right: No mass, skin change or tenderness.     Left: No mass, skin change or tenderness.    HENT:     Head: Normocephalic and atraumatic. No laceration.     Right Ear: Hearing normal.     Left Ear: Hearing normal.     Mouth/Throat:     Pharynx: Uvula midline.  Eyes:     Pupils: Pupils are equal, round, and reactive to light.  Neck:     Thyroid: No thyromegaly.  Cardiovascular:     Rate and Rhythm: Normal  rate and regular rhythm.     Heart sounds: No murmur heard. No friction rub. No gallop.   Pulmonary:     Effort: Pulmonary effort is normal. No respiratory distress.     Breath sounds: Normal breath sounds. No wheezing.  Abdominal:     General: Bowel sounds are normal. There is no distension.     Palpations: Abdomen is soft.     Tenderness: There is no abdominal tenderness. There is no rebound.  Musculoskeletal:        General: Normal range of motion.     Cervical back: Normal range of motion and neck supple.  Neurological:     Mental Status: She is alert and oriented to person, place, and time.     Cranial Nerves: No cranial nerve deficit.  Skin:    General: Skin is warm and dry.  Psychiatric:        Judgment: Judgment normal.  Vitals reviewed.     Assessment:  ANNUAL EXAM 1. Women's annual routine gynecological examination   2. Screening for cervical cancer   3. Screening for thyroid disorder   4. Screening for cholesterol level   5. Screening for diabetes mellitus      Screening Plan:            1.  Cervical Screening-  Pap smear done today  2. Breast screening- Exam annually and mammogram>40 planned   3. Colonoscopy every 10 years, Hemoccult testing - after age 74  4. Labs To return fasting at a later date  5. Counseling for contraception: Carol Carter  Will change to patch for compliance improvements. Pt plans on pregnancy perhaps in the next year. Altrnatives discussed as well.   Upstream - 09/01/20 1504      Pregnancy Intention Screening   Does the patient want to become pregnant in the next year? Yes    Does the patient's partner want to become pregnant in the next year? Yes    Would the patient like to discuss contraceptive options today? No      Contraception Wrap Up   Current Method Oral Contraceptive          The pregnancy intention screening data noted above was reviewed. Potential methods of contraception were discussed. The patient elected to  proceed with Contraceptive Patch.      F/U  Return in about 1 year (around 09/01/2021) for Annual, also LAB appt soon.  Annamarie Major, MD, [facility], [facility] 09/01/2020  3:25 PM

## 2020-09-02 ENCOUNTER — Other Ambulatory Visit: Payer: 59

## 2020-09-02 DIAGNOSIS — Z131 Encounter for screening for diabetes mellitus: Secondary | ICD-10-CM

## 2020-09-02 DIAGNOSIS — Z1329 Encounter for screening for other suspected endocrine disorder: Secondary | ICD-10-CM

## 2020-09-02 DIAGNOSIS — Z13220 Encounter for screening for lipoid disorders: Secondary | ICD-10-CM

## 2020-09-03 LAB — LIPID PANEL
Chol/HDL Ratio: 2.5 ratio (ref 0.0–4.4)
Cholesterol, Total: 174 mg/dL (ref 100–199)
HDL: 69 mg/dL (ref >39)
LDL Chol Calc (NIH): 79 mg/dL (ref 0–99)
Triglycerides: 154 mg/dL — ABNORMAL HIGH (ref 0–149)
VLDL Cholesterol Cal: 26 mg/dL (ref 5–40)

## 2020-09-03 LAB — CYTOLOGY - PAP
Comment: NEGATIVE
Diagnosis: NEGATIVE
High risk HPV: NEGATIVE

## 2020-09-03 LAB — TSH: TSH: 0.679 u[IU]/mL (ref 0.450–4.500)

## 2020-09-03 LAB — GLUCOSE, FASTING: Glucose, Plasma: 107 mg/dL — ABNORMAL HIGH (ref 65–99)

## 2021-01-05 ENCOUNTER — Other Ambulatory Visit: Payer: Self-pay | Admitting: Obstetrics & Gynecology

## 2021-01-05 DIAGNOSIS — R102 Pelvic and perineal pain: Secondary | ICD-10-CM

## 2021-01-06 NOTE — Telephone Encounter (Signed)
Contacted patient via phone. Patient aware of future scheduled appointments

## 2021-01-13 ENCOUNTER — Ambulatory Visit (HOSPITAL_COMMUNITY)
Admission: RE | Admit: 2021-01-13 | Discharge: 2021-01-13 | Disposition: A | Discharge status: Home / Self Care | Payer: 59 | Source: Ambulatory Visit | Attending: Obstetrics & Gynecology | Admitting: Obstetrics & Gynecology

## 2021-01-13 ENCOUNTER — Other Ambulatory Visit: Payer: Self-pay

## 2021-01-13 DIAGNOSIS — R102 Pelvic and perineal pain: Secondary | ICD-10-CM | POA: Insufficient documentation

## 2021-01-15 ENCOUNTER — Other Ambulatory Visit: Payer: Self-pay

## 2021-01-15 ENCOUNTER — Encounter: Payer: Self-pay | Admitting: Obstetrics & Gynecology

## 2021-01-15 ENCOUNTER — Ambulatory Visit (INDEPENDENT_AMBULATORY_CARE_PROVIDER_SITE_OTHER): Payer: 59 | Admitting: Obstetrics & Gynecology

## 2021-01-15 VITALS — BP 120/80 | Ht 63.0 in | Wt 175.0 lb

## 2021-01-15 DIAGNOSIS — N926 Irregular menstruation, unspecified: Secondary | ICD-10-CM

## 2021-01-15 DIAGNOSIS — R1032 Left lower quadrant pain: Secondary | ICD-10-CM | POA: Diagnosis not present

## 2021-01-15 NOTE — Progress Notes (Signed)
  HPI: Pt is a 30 yo G0 AA F who has been off of BCP for a few mos and trying for pregnancy for 1-2 mos and has had reg periods until Oct for which she has had irreg bleeding and LLQ pain, that does not radiate and has no assoc findings.  No n/v/d/c/f/c.  Neg uCG multiple times.  Ultrasound demonstrates no masses seen, no cysts These findings are Pelvis normal  PMHx: She  has a past medical history of Asthma, Dysmenorrhea, History of kidney stones, Left ovarian cyst (08/2019), and Sleep apnea (2017). Also,  has a past surgical history that includes Wisdom tooth extraction (07/17/2016); Cystoscopy (08/08/2019); and Laparoscopic ovarian cystectomy (Left, 08/15/2019)., family history includes Colon cancer (age of onset: 28) in her maternal aunt; Diabetes in her father; Heart Problems in her father; Hypertension in her brother and father; Liver disease in her brother; Thyroid disease in her mother.,  reports that she has been smoking cigarettes. She has been smoking an average of .1 packs per day. She has never used smokeless tobacco. She reports current alcohol use. She reports current drug use. Drug: Marijuana.  She has a current medication list which includes the following prescription(s): xulane. Also, has No Known Allergies.  Review of Systems  All other systems reviewed and are negative.  Objective: BP 120/80   Ht 5\' 3"  (1.6 m)   Wt 175 lb (79.4 kg)   BMI 31.00 kg/m   Physical examination Constitutional NAD, Conversant  Skin No rashes, lesions or ulceration.   Extremities: Moves all appropriately.  Normal ROM for age. No lymphadenopathy.  Neuro: Grossly intact  Psych: Oriented to PPT.  Normal mood. Normal affect.   Assessment:  LLQ pain  Irregular menses  Counseled - no GYN cause.  May be abn cycle alone.  Adhesions or endometriosis although low suspicion.  GI, MS< neuro causes discussed as well. Will wait and see what next cycle brings. Cont PNV.  A total of 20 minutes were  spent face-to-face with the patient as well as preparation, review, communication, and documentation during this encounter.   , MD, [facility], [facility] 01/15/2021  4:41 PM

## 2021-02-04 ENCOUNTER — Ambulatory Visit: Admit: 2021-02-04 | Payer: 59

## 2021-02-05 ENCOUNTER — Other Ambulatory Visit: Payer: Self-pay

## 2021-02-05 ENCOUNTER — Ambulatory Visit
Admission: RE | Admit: 2021-02-05 | Discharge: 2021-02-05 | Disposition: A | Discharge status: Home / Self Care | Payer: 59 | Source: Ambulatory Visit | Attending: Emergency Medicine | Admitting: Emergency Medicine

## 2021-02-05 VITALS — BP 119/86 | HR 98 | Temp 99.3°F | Resp 18

## 2021-02-05 DIAGNOSIS — Z1152 Encounter for screening for COVID-19: Secondary | ICD-10-CM

## 2021-02-05 DIAGNOSIS — J0110 Acute frontal sinusitis, unspecified: Secondary | ICD-10-CM

## 2021-02-05 MED ORDER — AMOXICILLIN 875 MG PO TABS: 875.0000 mg | ORAL_TABLET | Freq: Two times a day (BID) | ORAL | 0 refills | Status: AC

## 2021-02-05 NOTE — ED Triage Notes (Signed)
Pt c/o cough, HA, sinus pressure, bodyaches, and runny nose x 5 days

## 2021-02-05 NOTE — ED Provider Notes (Signed)
[facility]    CSN: [id number] Arrival date & time: 02/05/21  1053      History   Chief Complaint Chief Complaint  Patient presents with   Cough   Headache   Generalized Body Aches   Sinus Pressure     HPI Carol Carter is a 30 y.o. female.  Patient presents with 5-day history of  congestion, sinus pressure, runny nose, headache, cough.  She denies fever, chills, wheezing, shortness of breath, vomiting, diarrhea, or other symptoms.  Treatment at home with TheraFlu.  Her medical history includes asthma, kidney stones, sleep apnea, dysmenorrhea.  The history is provided by the patient and medical records.   Past Medical History:  Diagnosis Date   Asthma    teenager   Dysmenorrhea    History of kidney stones    Left ovarian cyst 08/2019   Sleep apnea 2017   does not wear cpap    Patient Active Problem List   Diagnosis Date Noted   BV (bacterial vaginosis) 03/11/2020   Left ovarian cyst    LGSIL on Pap smear of cervix 09/22/2017   Dysmenorrhea 09/22/2017   Pelvic pain 09/15/2017   Irregular intermenstrual bleeding 09/15/2017    Past Surgical History:  Procedure Laterality Date   CYSTOSCOPY  08/08/2019   LAPAROSCOPIC OVARIAN CYSTECTOMY Left 08/15/2019   Procedure: LAPAROSCOPIC OVARIAN CYSTECTOMY;  Surgeon: Nadara Mustard, MD;  Location: [facility] ORS;  Service: Gynecology;  Laterality: Left;   WISDOM TOOTH EXTRACTION  07/17/2016    OB History     Gravida  0   Para  0   Term  0   Preterm  0   AB  0   Living  0      SAB  0   IAB  0   Ectopic  0   Multiple  0   Live Births  0            Home Medications    Prior to Admission medications   Medication Sig Start Date End Date Taking? Authorizing Provider  amoxicillin (AMOXIL) 875 MG tablet Take 1 tablet (875 mg total) by mouth 2 (two) times daily for 7 days. 02/05/21 02/12/21 Yes Mickie Bail, NP  norelgestromin-ethinyl estradiol [employer] [facility]) 150-35 MCG/24HR transdermal patch Place 1  patch onto the skin once a week. One patch weekly for 3 weeks, then one week off. Patient not taking: Reported on 01/15/2021 09/01/20   Nadara Mustard, MD    Family History Family History  Problem Relation Age of Onset   Thyroid disease Mother    Hypertension Brother    Liver disease Brother    Colon cancer Maternal Aunt 51   Diabetes Father    Hypertension Father    Heart Problems Father     Social History Social History   Tobacco Use   Smoking status: Every Day    Packs/day: 0.10    Types: Cigarettes   Smokeless tobacco: Never  Vaping Use   Vaping Use: Never used  Substance Use Topics   Alcohol use: Yes    Comment: Occasional   Drug use: Yes    Types: Marijuana    Comment: over 10 years ago     Allergies   Patient has no known allergies.   Review of Systems Review of Systems  Constitutional:  Negative for chills and fever.  HENT:  Positive for congestion, postnasal drip, rhinorrhea and sinus pressure. Negative for ear pain and sore throat.   Respiratory:  Positive for cough. Negative for shortness of breath.   Cardiovascular:  Negative for chest pain and palpitations.  Gastrointestinal:  Negative for diarrhea and vomiting.  Skin:  Negative for color change and rash.  Neurological:  Positive for headaches. Negative for dizziness.  All other systems reviewed and are negative.   Physical Exam Triage Vital Signs ED Triage Vitals  Enc Vitals Group     BP      Pulse      Resp      Temp      Temp src      SpO2      Weight      Height      Head Circumference      Peak Flow      Pain Score      Pain Loc      Pain Edu?      Excl. in GC?    No data found.  Updated Vital Signs BP 119/86 (BP Location: Left Arm)   Pulse 98   Temp 99.3 F (37.4 C) (Oral)   Resp 18   LMP  (LMP Unknown)   SpO2 98%   Visual Acuity Right Eye Distance:   Left Eye Distance:   Bilateral Distance:    Right Eye Near:   Left Eye Near:    Bilateral Near:     Physical  Exam Vitals and nursing note reviewed.  Constitutional:      General: She is not in acute distress.    Appearance: She is well-developed.  HENT:     Head: Normocephalic and atraumatic.     Right Ear: Tympanic membrane normal.     Left Ear: Tympanic membrane normal.     Nose: Congestion and rhinorrhea present.     Mouth/Throat:     Mouth: Mucous membranes are moist.     Pharynx: Oropharynx is clear.  Eyes:     Conjunctiva/sclera: Conjunctivae normal.  Cardiovascular:     Rate and Rhythm: Normal rate and regular rhythm.     Heart sounds: Normal heart sounds.  Pulmonary:     Effort: Pulmonary effort is normal. No respiratory distress.     Breath sounds: Normal breath sounds.  Abdominal:     Palpations: Abdomen is soft.     Tenderness: There is no abdominal tenderness.  Musculoskeletal:     Cervical back: Neck supple.  Skin:    General: Skin is warm and dry.  Neurological:     Mental Status: She is alert.  Psychiatric:        Mood and Affect: Mood normal.        Behavior: Behavior normal.     UC Treatments / Results  Labs (all labs ordered are listed, but only abnormal results are displayed) Labs Reviewed  COVID-19, FLU A+B NAA    EKG   Radiology No results found.  Procedures Procedures (including critical care time)  Medications Ordered in UC Medications - No data to display  Initial Impression / Assessment and Plan / UC Course  I have reviewed the triage vital signs and the nursing notes.  Pertinent labs & imaging results that were available during my care of the patient were reviewed by me and considered in my medical decision making (see chart for details).   Acute sinusitis.  Patient has been symptomatic for 5 days and is not improving with OTC treatment.  Instructed her to continue OTC treatment and start amoxicillin if her symptoms are not improving in the next  2 to 3 days.  Instructed her to follow-up with her PCP as needed.  COVID and flu pending.  She  agrees to plan of care.   Final Clinical Impressions(s) / UC Diagnoses   Final diagnoses:  Encounter for screening for COVID-19  Acute non-recurrent frontal sinusitis     Discharge Instructions      Continue symptomatic treatment.  If your symptoms are not improving in the next 2 to 3 days, take the amoxicillin.  Follow up with your primary care provider if your symptoms are not improving.        ED Prescriptions     Medication Sig Dispense Auth. Provider   amoxicillin (AMOXIL) 875 MG tablet Take 1 tablet (875 mg total) by mouth 2 (two) times daily for 7 days. 14 tablet Mickie Bail, NP      PDMP not reviewed this encounter.   Mickie Bail, NP 02/05/21 1154

## 2021-02-05 NOTE — Discharge Instructions (Addendum)
Continue symptomatic treatment.  If your symptoms are not improving in the next 2 to 3 days, take the amoxicillin.  Follow up with your primary care provider if your symptoms are not improving.

## 2021-02-07 LAB — COVID-19, FLU A+B NAA
Influenza A, NAA: NOT DETECTED
Influenza B, NAA: NOT DETECTED
SARS-CoV-2, NAA: NOT DETECTED

## 2021-02-22 ENCOUNTER — Ambulatory Visit
Admission: RE | Admit: 2021-02-22 | Discharge: 2021-02-22 | Disposition: A | Discharge status: Home / Self Care | Payer: 59 | Source: Ambulatory Visit | Attending: Family Medicine | Admitting: Family Medicine

## 2021-02-22 VITALS — BP 132/79 | HR 75 | Temp 99.2°F | Resp 18

## 2021-02-22 DIAGNOSIS — H10531 Contact blepharoconjunctivitis, right eye: Secondary | ICD-10-CM | POA: Insufficient documentation

## 2021-02-22 DIAGNOSIS — J029 Acute pharyngitis, unspecified: Secondary | ICD-10-CM | POA: Insufficient documentation

## 2021-02-22 LAB — POCT RAPID STREP A (OFFICE): Rapid Strep A Screen: NEGATIVE

## 2021-02-22 MED ORDER — LIDOCAINE VISCOUS HCL 2 % MT SOLN: 10.0000 mL | OROMUCOSAL | 0 refills | Status: DC | PRN

## 2021-02-22 MED ORDER — POLYMYXIN B-TRIMETHOPRIM 10000-0.1 UNIT/ML-% OP SOLN: 2.0000 [drp] | Freq: Three times a day (TID) | OPHTHALMIC | 0 refills | Status: AC | PRN

## 2021-02-22 NOTE — Discharge Instructions (Addendum)
  Rapid strep was negative.  Treating for conjunctivitis instill drops into your right eye as prescribed for total of 7 days.  If any of your symptoms worsen return for reevaluation. If symptoms become severe go immediately to the ER    If you have a sore or scratchy throat, use a saltwater gargle-  to  teaspoon of salt dissolved with 10 ml's of  lidocaine viscous in a 4-ounce to 8-ounce glass of warm water.  Gargle the solution for approximately 15-30 seconds and then spit.  It is important not to swallow the solution.  You can also use throat lozenges/cough drops.

## 2021-02-22 NOTE — ED Triage Notes (Signed)
Pt here with sore throat and right eye irritation x 2 days.

## 2021-02-22 NOTE — ED Provider Notes (Signed)
Carol Carter    CSN: [id number] Arrival date & time: 02/22/21  1637      History   Chief Complaint Chief Complaint  Patient presents with   Sore Throat   Eye Problem    HPI Carol Carter is a 30 y.o. female.   HPI  Patient presents today with right eye itching and irritation x1 day.  She also complains of throat discomfort.  She was seen here in clinic 2 weeks ago and treated with amoxicillin for acute sinusitis.  At that time she was tested for COVID and flu all were negative.  She has been afebrile.  Reports taking 7 days of the amoxicillin but did not complete medication. Denies any other associated symptoms. Past Medical History:  Diagnosis Date   Asthma    teenager   Dysmenorrhea    History of kidney stones    Left ovarian cyst 08/2019   Sleep apnea 2017   does not wear cpap    Patient Active Problem List   Diagnosis Date Noted   BV (bacterial vaginosis) 03/11/2020   Left ovarian cyst    LGSIL on Pap smear of cervix 09/22/2017   Dysmenorrhea 09/22/2017   Pelvic pain 09/15/2017   Irregular intermenstrual bleeding 09/15/2017    Past Surgical History:  Procedure Laterality Date   CYSTOSCOPY  08/08/2019   LAPAROSCOPIC OVARIAN CYSTECTOMY Left 08/15/2019   Procedure: LAPAROSCOPIC OVARIAN CYSTECTOMY;  Surgeon: Nadara Mustard, MD;  Location: [facility] ORS;  Service: Gynecology;  Laterality: Left;   WISDOM TOOTH EXTRACTION  07/17/2016    OB History     Gravida  0   Para  0   Term  0   Preterm  0   AB  0   Living  0      SAB  0   IAB  0   Ectopic  0   Multiple  0   Live Births  0            Home Medications    Prior to Admission medications   Medication Sig Start Date End Date Taking? Authorizing Provider  lidocaine (XYLOCAINE) 2 % solution Use as directed 10 mLs in the mouth or throat as needed for mouth pain. 02/22/21  Yes Bing Neighbors, FNP  trimethoprim-polymyxin b (POLYTRIM) ophthalmic solution Place 2 drops into  the right eye 3 (three) times daily as needed for up to 7 days. 02/22/21 03/01/21 Yes Bing Neighbors, FNP  norelgestromin-ethinyl estradiol [facility]) 150-35 MCG/24HR transdermal patch Place 1 patch onto the skin once a week. One patch weekly for 3 weeks, then one week off. Patient not taking: Reported on 01/15/2021 09/01/20   Nadara Mustard, MD    Family History Family History  Problem Relation Age of Onset   Thyroid disease Mother    Hypertension Brother    Liver disease Brother    Colon cancer Maternal Aunt 62   Diabetes Father    Hypertension Father    Heart Problems Father     Social History Social History   Tobacco Use   Smoking status: Every Day    Packs/day: 0.10    Types: Cigarettes   Smokeless tobacco: Never  Vaping Use   Vaping Use: Never used  Substance Use Topics   Alcohol use: Yes    Comment: Occasional   Drug use: Yes    Types: Marijuana    Comment: over 10 years ago     Allergies   Patient has no  known allergies.   Review of Systems Review of Systems Pertinent negatives listed in HPI  Physical Exam Triage Vital Signs ED Triage Vitals  Enc Vitals Group     BP 02/22/21 1706 132/79     Pulse Rate 02/22/21 1706 75     Resp 02/22/21 1706 18     Temp 02/22/21 1706 99.2 F (37.3 C)     Temp Source 02/22/21 1706 Oral     SpO2 02/22/21 1706 97 %     Weight --      Height --      Head Circumference --      Peak Flow --      Pain Score 02/22/21 1725 5     Pain Loc --      Pain Edu? --      Excl. in GC? --    No data found.  Updated Vital Signs BP 132/79 (BP Location: Left Arm)   Pulse 75   Temp 99.2 F (37.3 C) (Oral)   Resp 18   LMP  (LMP Unknown)   SpO2 97%   Visual Acuity Right Eye Distance:   Left Eye Distance:   Bilateral Distance:    Right Eye Near:   Left Eye Near:    Bilateral Near:     Physical Exam Constitutional:      Appearance: She is well-developed.  HENT:     Head: Normocephalic.     Mouth/Throat:      Pharynx: Pharyngeal swelling and posterior oropharyngeal erythema present. No oropharyngeal exudate or uvula swelling.     Tonsils: No tonsillar exudate or tonsillar abscesses.  Cardiovascular:     Rate and Rhythm: Normal rate and regular rhythm.  Pulmonary:     Effort: Pulmonary effort is normal.     Breath sounds: Normal breath sounds.  Skin:    Capillary Refill: Capillary refill takes less than 2 seconds.  Neurological:     General: No focal deficit present.     Mental Status: She is alert.     UC Treatments / Results  Labs (all labs ordered are listed, but only abnormal results are displayed) Labs Reviewed  POCT RAPID STREP A (OFFICE)    EKG   Radiology No results found.  Procedures Procedures (including critical care time)  Medications Ordered in UC Medications - No data to display  Initial Impression / Assessment and Plan / UC Course  I have reviewed the triage vital signs and the nursing notes.  Pertinent labs & imaging results that were available during my care of the patient were reviewed by me and considered in my medical decision making (see chart for details).    Contact blepharoconjunctivitis involving the right eye treatment with Polytrim.  Rapid strep collected to evaluate for sore throat was negative.  Throat culture is pending.  At present continue conservative management of throat pain as this is likely viral.   Return to UC as needed. Final Clinical Impressions(s) / UC Diagnoses   Final diagnoses:  Contact blepharoconjunctivitis of right eye  Sore throat     Discharge Instructions       Rapid strep was negative.  Treating for conjunctivitis instill drops into your right eye as prescribed for total of 7 days.  If any of your symptoms worsen return for reevaluation. If symptoms become severe go immediately to the ER    If you have a sore or scratchy throat, use a saltwater gargle-  to  teaspoon of salt dissolved with 10 ml's  of  lidocaine  viscous in a 4-ounce to 8-ounce glass of warm water.  Gargle the solution for approximately 15-30 seconds and then spit.  It is important not to swallow the solution.  You can also use throat lozenges/cough drops.     ED Prescriptions     Medication Sig Dispense Auth. Provider   trimethoprim-polymyxin b (POLYTRIM) ophthalmic solution Place 2 drops into the right eye 3 (three) times daily as needed for up to 7 days. 10 mL Bing Neighbors, FNP   lidocaine (XYLOCAINE) 2 % solution Use as directed 10 mLs in the mouth or throat as needed for mouth pain. 50 mL Bing Neighbors, FNP      PDMP not reviewed this encounter.   Bing Neighbors, FNP 02/22/21 [phone]

## 2021-02-25 LAB — CULTURE, GROUP A STREP (THRC)

## 2021-04-04 NOTE — L&D Delivery Note (Signed)
Delivery Summary for Carol Carter  Labor Events:   Preterm labor: No data found  Rupture date: 03/31/2022  Rupture time: 4:52 PM  Rupture type: Spontaneous Intact  Fluid Color: Clear  Induction: No data found  Augmentation: No data found  Complications: No data found  Cervical ripening: No data found No data found   No data found     Delivery:   Episiotomy: No data found  Lacerations: No data found  Repair suture: No data found  Repair # of packets: No data found  Blood loss (ml): No data found   Information for the patient's newbornMarquitta, Persichetti [[id number]]   Delivery 04/01/2022 7:42 PM by  C-Section, Low Transverse Sex:  female Gestational Age: [redacted]w[redacted]d Delivery Clinician:   Living?:         APGARS  One minute Five minutes Ten minutes  Skin color:        Heart rate:        Grimace:        Muscle tone:        Breathing:        Totals:  8  8     Presentation/position:      Resuscitation:   Cord information:    Disposition of cord blood:     Blood gases sent?  Complications:   Placenta: Delivered:       appearance Newborn Measurements: Weight: 8 lb 2.9 oz (3710 g)  Height: 20.47"  Head circumference:    Chest circumference:    Other providers:    Additional  information: Forceps:   Vacuum:   Breech:   Observed anomalies N/A       Please see Dr. Oretha Milch operative note for details of C-section procedure   Hildred Laser, MD [facility]

## 2021-04-13 ENCOUNTER — Encounter: Payer: Self-pay | Admitting: Obstetrics & Gynecology

## 2021-04-14 ENCOUNTER — Ambulatory Visit (INDEPENDENT_AMBULATORY_CARE_PROVIDER_SITE_OTHER): Payer: 59

## 2021-04-14 ENCOUNTER — Other Ambulatory Visit: Payer: Self-pay

## 2021-04-14 ENCOUNTER — Telehealth: Payer: Self-pay

## 2021-04-14 DIAGNOSIS — R300 Dysuria: Secondary | ICD-10-CM | POA: Diagnosis not present

## 2021-04-14 LAB — POCT URINALYSIS DIPSTICK
Bilirubin, UA: NEGATIVE
Blood, UA: POSITIVE
Glucose, UA: NEGATIVE
Ketones, UA: NEGATIVE
Leukocytes, UA: NEGATIVE
Nitrite, UA: NEGATIVE
Protein, UA: NEGATIVE
Spec Grav, UA: 1.01 (ref 1.010–1.025)
Urobilinogen, UA: 0.2 E.U./dL
pH, UA: 5 (ref 5.0–8.0)

## 2021-04-14 NOTE — Telephone Encounter (Signed)
Add on nurse schedule plz

## 2021-04-14 NOTE — Telephone Encounter (Signed)
Patient is schedule 04/16/21 with [facility]

## 2021-04-14 NOTE — Telephone Encounter (Signed)
Patient has been added to nurse schedule.

## 2021-04-14 NOTE — Telephone Encounter (Signed)
Can you call and schedule an appointment.maybe for Friday?

## 2021-04-14 NOTE — Telephone Encounter (Signed)
Pt dropped off urine sample, c/o pelvic and lower back pain. UA shows blood. Do you want to order a culture?

## 2021-04-15 ENCOUNTER — Ambulatory Visit: Payer: 59

## 2021-04-16 ENCOUNTER — Ambulatory Visit (INDEPENDENT_AMBULATORY_CARE_PROVIDER_SITE_OTHER): Payer: 59 | Admitting: Obstetrics & Gynecology

## 2021-04-16 ENCOUNTER — Other Ambulatory Visit (HOSPITAL_COMMUNITY)
Admission: RE | Admit: 2021-04-16 | Discharge: 2021-04-16 | Disposition: A | Discharge status: Home / Self Care | Payer: 59 | Source: Ambulatory Visit | Attending: Obstetrics & Gynecology | Admitting: Obstetrics & Gynecology

## 2021-04-16 ENCOUNTER — Encounter: Payer: Self-pay | Admitting: Obstetrics & Gynecology

## 2021-04-16 ENCOUNTER — Other Ambulatory Visit: Payer: Self-pay

## 2021-04-16 VITALS — BP 120/80 | Ht 63.0 in | Wt 178.0 lb

## 2021-04-16 DIAGNOSIS — N926 Irregular menstruation, unspecified: Secondary | ICD-10-CM

## 2021-04-16 DIAGNOSIS — R1032 Left lower quadrant pain: Secondary | ICD-10-CM | POA: Insufficient documentation

## 2021-04-16 DIAGNOSIS — N898 Other specified noninflammatory disorders of vagina: Secondary | ICD-10-CM | POA: Insufficient documentation

## 2021-04-16 NOTE — Progress Notes (Signed)
Gynecology Pelvic Pain Evaluation   Chief Complaint:  Chief Complaint  Patient presents with   LLO pain     History of Present Illness:   Patient is a 31 y.o. G0P0000 who LMP was Patient's last menstrual period was 03/19/2021., presents today for a problem visit.  She complains of pain.   Her pain is localized to the LLQ area, described as intermittent, began about a month ago and its severity is described as moderate. The pain radiates to the  left back. She has these associated symptoms which include nausea. Patient has these modifiers which include relaxation and pain medication that make it better and  activity and coughing  that make it worse.  Period is due soon; not on birth control medicine.    Context includes: prior Left Ovarian Cyst and prior left renal calculi.  Prior laparoscopy and left ovarian cystectomy 2021.    PMHx: She  has a past medical history of Asthma, Dysmenorrhea, History of kidney stones, Left ovarian cyst (08/2019), and Sleep apnea (2017). Also,  has a past surgical history that includes Wisdom tooth extraction (07/17/2016); Cystoscopy (08/08/2019); and Laparoscopic ovarian cystectomy (Left, 08/15/2019)., family history includes Colon cancer (age of onset: 13) in her maternal aunt; Diabetes in her father; Heart Problems in her father; Hypertension in her brother and father; Liver disease in her brother; Thyroid disease in her mother.,  reports that she has been smoking cigarettes. She has been smoking an average of .1 packs per day. She has never used smokeless tobacco. She reports current alcohol use. She reports current drug use. Drug: Marijuana.  She has a current medication list which includes the following prescription(s): lidocaine. Also, has No Known Allergies.  Review of Systems  Constitutional:  Negative for chills, fever and malaise/fatigue.  HENT:  Negative for congestion, sinus pain and sore throat.   Eyes:  Negative for blurred vision and pain.   Respiratory:  Negative for cough and wheezing.   Cardiovascular:  Negative for chest pain and leg swelling.  Gastrointestinal:  Positive for abdominal pain and nausea. Negative for constipation, diarrhea, heartburn and vomiting.  Genitourinary:  Negative for dysuria, frequency, hematuria and urgency.  Musculoskeletal:  Negative for back pain, joint pain, myalgias and neck pain.  Skin:  Negative for itching and rash.  Neurological:  Negative for dizziness, tremors and weakness.  Endo/Heme/Allergies:  Does not bruise/bleed easily.  Psychiatric/Behavioral:  Negative for depression. The patient is not nervous/anxious and does not have insomnia.    Objective: BP 120/80    Ht 5\' 3"  (1.6 m)    Wt 178 lb (80.7 kg)    LMP 03/19/2021    BMI 31.53 kg/m  Physical Exam Constitutional:      General: She is not in acute distress.    Appearance: She is well-developed.  Genitourinary:     Genitourinary Comments: Anteverted uterus vs left ovarian cyst in this area Mildly T No CMT     Right Labia: No rash or tenderness.    Left Labia: No tenderness or rash.    No vaginal erythema or bleeding.      Right Adnexa: not tender and no mass present.    Left Adnexa: not tender and no mass present.    No cervical motion tenderness, discharge, polyp or nabothian cyst.     Uterus is not enlarged.     No uterine mass detected.    Pelvic exam was performed with patient in the lithotomy position.  HENT:  Head: Normocephalic and atraumatic.     Nose: Nose normal.  Abdominal:     General: There is no distension.     Palpations: Abdomen is soft.     Tenderness: There is no abdominal tenderness.  Musculoskeletal:        General: Normal range of motion.  Neurological:     Mental Status: She is alert and oriented to person, place, and time.     Cranial Nerves: No cranial nerve deficit.  Skin:    General: Skin is warm and dry.  Psychiatric:        Attention and Perception: Attention normal.        Mood  and Affect: Mood and affect normal.        Speech: Speech normal.        Behavior: Behavior normal.        Thought Content: Thought content normal.        Judgment: Judgment normal.    Female chaperone present for pelvic portion of the physical exam  Assessment: 31 y.o. G0P0000 with   1. LLQ pain Will eval for left ovarian cyst recurrence - US PELVIC COMPLETE WITH TRANSVAGINAL; Future - Beta hCG quant (ref lab) - Cervicovaginal ancillary only - CBC  2. Irregular menses - Beta hCG quant (ref lab) - CBC  3. Vaginal discharge - Cervicovaginal ancillary only   Barnett Applebaum, MD, [facility], [facility] 04/16/2021  1:49 PM

## 2021-04-17 LAB — BETA HCG QUANT (REF LAB): hCG Quant: <1 m[IU]/mL

## 2021-04-17 LAB — CBC
Hematocrit: 35.2 % (ref 34.0–46.6)
Hemoglobin: 11.7 g/dL (ref 11.1–15.9)
MCH: 28.7 pg (ref 26.6–33.0)
MCHC: 33.2 g/dL (ref 31.5–35.7)
MCV: 87 fL (ref 79–97)
Platelets: 418 10*3/uL (ref 150–450)
RBC: 4.07 x10E6/uL (ref 3.77–5.28)
RDW: 12.5 % (ref 11.7–15.4)
WBC: 5.1 10*3/uL (ref 3.4–10.8)

## 2021-04-20 LAB — CERVICOVAGINAL ANCILLARY ONLY
Bacterial Vaginitis (gardnerella): NEGATIVE
Candida Glabrata: NEGATIVE
Candida Vaginitis: NEGATIVE
Chlamydia: NEGATIVE
Comment: NEGATIVE
Comment: NEGATIVE
Comment: NEGATIVE
Comment: NEGATIVE
Comment: NEGATIVE
Comment: NORMAL
Neisseria Gonorrhea: NEGATIVE
Trichomonas: NEGATIVE

## 2021-04-21 ENCOUNTER — Other Ambulatory Visit: Payer: Self-pay

## 2021-04-21 ENCOUNTER — Ambulatory Visit
Admission: RE | Admit: 2021-04-21 | Discharge: 2021-04-21 | Disposition: A | Discharge status: Home / Self Care | Payer: 59 | Source: Ambulatory Visit | Attending: Obstetrics & Gynecology | Admitting: Obstetrics & Gynecology

## 2021-04-21 DIAGNOSIS — R1032 Left lower quadrant pain: Secondary | ICD-10-CM | POA: Diagnosis not present

## 2021-04-22 ENCOUNTER — Encounter: Payer: Self-pay | Admitting: Obstetrics & Gynecology

## 2021-04-22 ENCOUNTER — Other Ambulatory Visit: Payer: Self-pay | Admitting: Obstetrics & Gynecology

## 2021-04-22 DIAGNOSIS — R1032 Left lower quadrant pain: Secondary | ICD-10-CM

## 2021-04-22 DIAGNOSIS — R109 Unspecified abdominal pain: Secondary | ICD-10-CM

## 2021-05-04 ENCOUNTER — Other Ambulatory Visit: Payer: Self-pay

## 2021-05-04 ENCOUNTER — Encounter: Payer: Self-pay | Admitting: Obstetrics & Gynecology

## 2021-05-04 ENCOUNTER — Ambulatory Visit
Admission: RE | Admit: 2021-05-04 | Discharge: 2021-05-04 | Disposition: A | Discharge status: Home / Self Care | Payer: 59 | Source: Ambulatory Visit | Attending: Obstetrics & Gynecology | Admitting: Obstetrics & Gynecology

## 2021-05-04 DIAGNOSIS — R1032 Left lower quadrant pain: Secondary | ICD-10-CM

## 2021-05-04 DIAGNOSIS — R109 Unspecified abdominal pain: Secondary | ICD-10-CM

## 2021-05-04 MED ORDER — IOPAMIDOL (ISOVUE-300) INJECTION 61%
100.0000 mL | Freq: Once | INTRAVENOUS | Status: AC | PRN
  Administered 2021-05-04: 100 mL via INTRAVENOUS

## 2021-05-10 ENCOUNTER — Other Ambulatory Visit: Payer: Self-pay | Admitting: Obstetrics & Gynecology

## 2021-05-10 DIAGNOSIS — R1032 Left lower quadrant pain: Secondary | ICD-10-CM

## 2021-05-10 NOTE — Telephone Encounter (Signed)
Referral to urology as still has pain, may or may not be related to kidney stone (no ovarian cyst or gyn source found)

## 2021-05-27 ENCOUNTER — Other Ambulatory Visit: Payer: Self-pay | Admitting: *Deleted

## 2021-05-27 DIAGNOSIS — R3129 Other microscopic hematuria: Secondary | ICD-10-CM

## 2021-05-27 NOTE — Progress Notes (Signed)
05/28/21 1:28 PM   Kabella N Tejada 1991-01-08 [id number]  Referring provider:  Nadara Mustard, MD [address] [place],  [place] [address] Chief Complaint  Patient presents with   Nephrolithiasis     HPI: Carolynn N Laventure is a 31 y.o.female who presents today for further evaluation of left lower quadrant pain.   She has personal history of kidney stones.   She underwent a CT abdomen and pelvis on 05/04/2021 for further evaluation of LLQ pain which visualized 6 mm hypodensity at the right kidney upper pole is too small to definitively characterize. 4 mm stone in the right kidney lower pole without hydronephrosis. Normal appearance of the urinary bladder. Normal appearance of the left kidney without stones or hydronephrosis. No suspicious renal lesions.  She reports that she developed pain near left sacroiliac joint hat radiated across her stomach and back. This pain has been ongoing. She reports that she has had increased frequency for about a month. She reports that her hip has been in  in pain usually during movement she had tried heat pads and ibuprofen and has been ongoing since December.    PMH: Past Medical History:  Diagnosis Date   Asthma    teenager   Dysmenorrhea    History of kidney stones    Left ovarian cyst 08/2019   Sleep apnea 2017   does not wear cpap    Surgical History: Past Surgical History:  Procedure Laterality Date   CYSTOSCOPY  08/08/2019   LAPAROSCOPIC OVARIAN CYSTECTOMY Left 08/15/2019   Procedure: LAPAROSCOPIC OVARIAN CYSTECTOMY;  Surgeon: Nadara Mustard, MD;  Location: [facility] ORS;  Service: Gynecology;  Laterality: Left;   WISDOM TOOTH EXTRACTION  07/17/2016    Home Medications:  Allergies as of 05/28/2021   No Known Allergies      Medication List        Accurate as of May 28, 2021  1:28 PM. If you have any questions, ask your nurse or doctor.          STOP taking these medications    lidocaine 2 %  solution Commonly known as: XYLOCAINE Stopped by: Vanna Scotland, MD        Allergies: No Known Allergies  Family History: Family History  Problem Relation Age of Onset   Thyroid disease Mother    Hypertension Brother    Liver disease Brother    Colon cancer Maternal Aunt 30   Diabetes Father    Hypertension Father    Heart Problems Father     Social History:  reports that she has been smoking cigarettes. She has been smoking an average of .1 packs per day. She has never used smokeless tobacco. She reports current alcohol use. She reports current drug use. Drug: Marijuana.   Physical Exam: BP 117/80    Pulse 70    Ht 5\' 3"  (1.6 m)    Wt 170 lb (77.1 kg)    BMI 30.11 kg/m   Constitutional:  Alert and oriented, No acute distress. HEENT: [place] AT, moist mucus membranes.  Trachea midline, no masses. Cardiovascular: No clubbing, cyanosis, or edema. Respiratory: Normal respiratory effort, no increased work of breathing. GI: tenderness at Left iliac  joint  Skin: No rashes, bruises or suspicious lesions. Neurologic: Grossly intact, no focal deficits, moving all 4 extremities. Psychiatric: Normal mood and affect.  Laboratory Data: Lab Results  Component Value Date   CREATININE 0.90 05/08/2017   Lab Results  Component Value Date   HGBA1C 5.4 09/15/2017  Urinalysis Component     Latest Ref Rng & Units 05/28/2021  Color, Urine     YELLOW YELLOW  Appearance     CLEAR CLEAR  Specific Gravity, Urine     1.005 - 1.030 1.015  pH     5.0 - 8.0 6.0  Glucose, UA     NEGATIVE mg/dL NEGATIVE  Hgb urine dipstick     NEGATIVE TRACE (A)  Bilirubin Urine     NEGATIVE NEGATIVE  Ketones, ur     NEGATIVE mg/dL NEGATIVE  Protein     NEGATIVE mg/dL NEGATIVE  Nitrite     NEGATIVE POSITIVE (A)  Leukocytes,Ua     NEGATIVE NEGATIVE  Squamous Epithelial / LPF     0 - 5 6-10  WBC, UA     0 - 5 WBC/hpf 0-5  RBC / HPF     0 - 5 RBC/hpf 6-10  Bacteria, UA     NONE SEEN FEW (A)     Pertinent Imaging: CLINICAL DATA:  Left lower quadrant abdominal pain.   EXAM: CT ABDOMEN AND PELVIS WITH CONTRAST   TECHNIQUE: Multidetector CT imaging of the abdomen and pelvis was performed using the standard protocol following bolus administration of intravenous contrast.   RADIATION DOSE REDUCTION: This exam was performed according to the departmental dose-optimization program which includes automated exposure control, adjustment of the mA and/or kV according to patient size and/or use of iterative reconstruction technique.   CONTRAST:  ISOVUE-300 IOPAMIDOL (ISOVUE-300) INJECTION 61%   COMPARISON:  07/19/2019   FINDINGS: Lower chest: Lung bases are clear.   Hepatobiliary: Normal appearance of the liver, gallbladder and portal venous system.   Pancreas: Unremarkable. No pancreatic ductal dilatation or surrounding inflammatory changes.   Spleen: Normal in size without focal abnormality.   Adrenals/Urinary Tract: Normal adrenal glands. 6 mm hypodensity at the right kidney upper pole is too small to definitively characterize. 4 mm stone in the right kidney lower pole without hydronephrosis. Normal appearance of the urinary bladder. Normal appearance of the left kidney without stones or hydronephrosis. No suspicious renal lesions.   Stomach/Bowel: Stomach is within normal limits. Appendix appears normal. No evidence of bowel wall thickening, distention, or inflammatory changes.   Vascular/Lymphatic: No significant vascular findings are present. No enlarged abdominal or pelvic lymph nodes.   Reproductive: Normal appearance of the uterus. No evidence for an adnexal mass. Subtle low-density structure along the left side of the vagina on sequence 2 image 90 measures 1.0 cm and could represent a Bartholin gland cyst. Urethra is slightly prominent and nonspecific.   Other: Negative for ascites. Negative for free air. Tiny umbilical hernia containing fat.    Musculoskeletal: No acute bone abnormality.   IMPRESSION: 1. 4 mm right kidney stone. Negative for hydronephrosis. Prominent urethra is nonspecific. 2. No acute inflammatory changes in the abdomen or pelvis. 3. Probable Bartholin gland cyst.     Electronically Signed   By: Richarda Overlie M.D.   On: 05/04/2021 17:14  CT scan images were personally reviewed today as well as with the patient in the room.  I also compared to her previous CT scan, minimal interval growth of a nonobstructing right lower pole stone approximately 4 mm.  Assessment & Plan:    Urinary frequency/ possible cystitis - Unrelated to stones   - Urine today was grossly positive with nitrite and few bacteria. Will send urine for culture and treat her with Macrobid x7 days.   2. Hip pain - Likely musculoskeletal  in nature  -  Recommend she continue NSAIDs, stretching,  and heating pad  - Recommend she establish care with a PCP  3.  Right kidney stone -- CT abdomen and pelvis showed stones are unchanged not the etiology of LLQ pain -Recommend continued conservative management   [facility] [address], [address] [place], [place] [address] [phone]

## 2021-05-28 ENCOUNTER — Encounter: Payer: Self-pay | Admitting: Urology

## 2021-05-28 ENCOUNTER — Other Ambulatory Visit: Payer: Self-pay

## 2021-05-28 ENCOUNTER — Ambulatory Visit (INDEPENDENT_AMBULATORY_CARE_PROVIDER_SITE_OTHER): Payer: 59 | Admitting: Urology

## 2021-05-28 ENCOUNTER — Other Ambulatory Visit
Admission: RE | Admit: 2021-05-28 | Discharge: 2021-05-28 | Disposition: A | Discharge status: Home / Self Care | Payer: 59 | Attending: Urology | Admitting: Urology

## 2021-05-28 VITALS — BP 117/80 | HR 70 | Ht 63.0 in | Wt 170.0 lb

## 2021-05-28 DIAGNOSIS — R3129 Other microscopic hematuria: Secondary | ICD-10-CM | POA: Insufficient documentation

## 2021-05-28 DIAGNOSIS — N200 Calculus of kidney: Secondary | ICD-10-CM | POA: Insufficient documentation

## 2021-05-28 LAB — URINALYSIS, COMPLETE (UACMP) WITH MICROSCOPIC
Bilirubin Urine: NEGATIVE
Glucose, UA: NEGATIVE mg/dL
Ketones, ur: NEGATIVE mg/dL
Leukocytes,Ua: NEGATIVE
Nitrite: POSITIVE — AB
Protein, ur: NEGATIVE mg/dL
Specific Gravity, Urine: 1.015 (ref 1.005–1.030)
pH: 6 (ref 5.0–8.0)

## 2021-05-28 MED ORDER — NITROFURANTOIN MONOHYD MACRO 100 MG PO CAPS: 100.0000 mg | ORAL_CAPSULE | Freq: Two times a day (BID) | ORAL | 0 refills | Status: DC

## 2021-05-30 LAB — URINE CULTURE: Culture: 70000 — AB

## 2021-06-23 ENCOUNTER — Telehealth: Payer: Self-pay | Admitting: Surgery

## 2021-06-23 NOTE — Telephone Encounter (Signed)
Patient had a few questions decided to keep appointment tomorrow.  ?

## 2021-06-23 NOTE — Telephone Encounter (Signed)
Patient is calling and has an appointment for tomorrow but just has some questions and is asking if one of the nurses would give her a call back. Please call and advise. ?

## 2021-06-24 ENCOUNTER — Encounter: Payer: Self-pay | Admitting: Surgery

## 2021-06-24 ENCOUNTER — Other Ambulatory Visit: Payer: Self-pay

## 2021-06-24 ENCOUNTER — Ambulatory Visit (INDEPENDENT_AMBULATORY_CARE_PROVIDER_SITE_OTHER): Payer: 59 | Admitting: Surgery

## 2021-06-24 VITALS — BP 108/76 | HR 94 | Temp 98.8°F | Ht 63.0 in | Wt 170.6 lb

## 2021-06-24 DIAGNOSIS — L02422 Furuncle of left axilla: Secondary | ICD-10-CM

## 2021-06-24 NOTE — Patient Instructions (Signed)
Apply warm compresses throughout the day-  ? ?Start taking the Antibiotic if it worsens. Please call our office if you have any questions or concerns. ?

## 2021-06-24 NOTE — Progress Notes (Signed)
Patient ID: Carol Carter, female   DOB: 03-Sep-1990, 31 y.o.   MRN: [id number] ? ?Chief Complaint: Left axillary boil. ? ?History of Present Illness ?Carol Carter is a 31 y.o. female with a golf ball size mass that was tender, inflamed, that eventually progressed to a yellow surface, which then spontaneously drained in the middle of the night..  She had been seen 9 days ago, and prescribed doxycycline to have available.  However since then spontaneous drainage and healing has occurred.  She denies fevers and chills.  She reports resolution of her pain and tenderness. ? ?Past Medical History ?Past Medical History:  ?Diagnosis Date  ? Asthma   ? teenager  ? Dysmenorrhea   ? History of kidney stones   ? Left ovarian cyst 08/2019  ? Sleep apnea 2017  ? does not wear cpap  ?  ? ? ?Past Surgical History:  ?Procedure Laterality Date  ? CYSTOSCOPY  08/08/2019  ? LAPAROSCOPIC OVARIAN CYSTECTOMY Left 08/15/2019  ? Procedure: LAPAROSCOPIC OVARIAN CYSTECTOMY;  Surgeon: Nadara Mustard, MD;  Location: [facility] ORS;  Service: Gynecology;  Laterality: Left;  ? WISDOM TOOTH EXTRACTION  07/17/2016  ? ? ?No Known Allergies ? ?Current Outpatient Medications  ?Medication Sig Dispense Refill  ? cyclobenzaprine (FLEXERIL) 10 MG tablet Take 10 mg by mouth at bedtime as needed.    ? doxycycline (VIBRA-TABS) 100 MG tablet Take 100 mg by mouth 2 (two) times daily.    ? nitrofurantoin, macrocrystal-monohydrate, (MACROBID) 100 MG capsule Take 1 capsule (100 mg total) by mouth 2 (two) times daily. 14 capsule 0  ? ?No current facility-administered medications for this visit.  ? ? ?Family History ?Family History  ?Problem Relation Age of Onset  ? Thyroid disease Mother   ? Hypertension Brother   ? Liver disease Brother   ? Colon cancer Maternal Aunt 52  ? Diabetes Father   ? Hypertension Father   ? Heart Problems Father   ?  ? ? ?Social History ?Social History  ? ?Tobacco Use  ? Smoking status: Every Day  ?  Packs/day: 0.10  ?  Types: Cigarettes   ? Smokeless tobacco: Never  ?Vaping Use  ? Vaping Use: Never used  ?Substance Use Topics  ? Alcohol use: Yes  ?  Comment: Occasional  ? Drug use: Yes  ?  Types: Marijuana  ?  Comment: over 10 years ago  ?  ?  ? ? ?Review of Systems  ?Constitutional:  Negative for chills and fever.  ?HENT: Negative.    ?Eyes: Negative.   ?Respiratory: Negative.    ?Cardiovascular: Negative.   ?Gastrointestinal:  Positive for nausea.  ?Genitourinary:  Negative for dysuria, frequency and urgency.  ?Skin:  Negative for itching and rash.  ?Neurological: Negative.   ?Psychiatric/Behavioral: Negative.    ?  ? ?Physical Exam ?Blood pressure 108/76, pulse 94, temperature 98.8 ?F (37.1 ?C), temperature source Oral, height 5\' 3"  (1.6 m), weight 170 lb 9.6 oz (77.4 kg), SpO2 97 %. ?Last Weight  Most recent update: 06/24/2021  9:19 AM  ? ? Weight  ?77.4 kg (170 lb 9.6 oz)  ?      ? ?  ? ? ?CONSTITUTIONAL: Well developed, and nourished, appropriately responsive and aware without distress.   ?EYES: Sclera non-icteric.   ?EARS, NOSE, MOUTH AND THROAT: Mask worn.   The oropharynx is clear. Oral mucosa is pink and moist.    Hearing is intact to voice.  ?NECK: Trachea is midline, and there is no  jugular venous distension.  ?LYMPH NODES:  Lymph nodes in the neck are not enlarged. ?RESPIRATORY:  Lungs are clear, and breath sounds are equal bilaterally. Normal respiratory effort without pathologic use of accessory muscles. ?CARDIOVASCULAR: Heart is regular in rate and rhythm. ?GI: The abdomen is soft, nontender, and nondistended. There were no palpable masses. ?MUSCULOSKELETAL:  Symmetrical muscle tone appreciated in all four extremities.    ?SKIN: Skin turgor is normal. No pathologic skin lesions appreciated.  ?There is a linear density on the medial aspect of the left axilla, the point of previous drainage is appreciated now with a strong epidermal layer covering a minimally 5 mm like cystic area, that is completely nontender without adjacent erythema  or induration.  No other pits or lesions appreciated in the axilla, no deep soft tissue tenderness or induration appreciated. ?NEUROLOGIC:  Motor and sensation appear grossly normal.  Cranial nerves are grossly without defect. ?PSYCH:  Alert and oriented to person, place and time. Affect is appropriate for situation. ? ?Data Reviewed ?I have personally reviewed what is currently available of the patient's imaging, recent labs and medical records.   ?Labs:  ? ?  Latest Ref Rng & Units 04/16/2021  ?  1:55 PM 08/13/2019  ?  9:11 AM 05/08/2017  ?  1:15 PM  ?CBC  ?WBC 3.4 - 10.8 x10E3/uL 5.1   7.2   4.7    ?Hemoglobin 11.1 - 15.9 g/dL 47.0   96.2   83.6    ?Hematocrit 34.0 - 46.6 % 35.2   37.8   40.2    ?Platelets 150 - 450 x10E3/uL 418   456   456    ? ? ?  Latest Ref Rng & Units 05/08/2017  ?  1:15 PM 01/02/2017  ?  4:06 PM 08/29/2016  ? 11:32 AM  ?CMP  ?Glucose 65 - 99 mg/dL 93   96   99    ?BUN 6 - 20 mg/dL 9   9   7     ?Creatinine 0.44 - 1.00 mg/dL   6.29   4.76    ?Sodium 135 - 145 mmol/L 137   139   138    ?Potassium 3.5 - 5.1 mmol/L 3.5   3.7   3.8    ?Chloride 101 - 111 mmol/L 103   105   107    ?CO2 22 - 32 mmol/L 27   29   26     ?Calcium 8.9 - 10.3 mg/dL 9.2   9.1   9.1    ?Total Protein 6.5 - 8.1 g/dL 8.6   7.8   8.3    ?Total Bilirubin 0.3 - 1.2 mg/dL 0.6   0.3   0.7    ?Alkaline Phos 38 - 126 U/L 57   44   63    ?AST 15 - 41 U/L 19   17   24     ?ALT 14 - 54 U/L 12   11   17     ? ? ? ? ?Imaging: ? ?Within last 24 hrs: No results found. ? ?Assessment ?   ?Resolving left axillary boil, resolution appears near complete.  Without the utilization of antibiotics, nor warm compresses. ?Patient Active Problem List  ? Diagnosis Date Noted  ? Furuncle of left axilla 06/24/2021  ? BV (bacterial vaginosis) 03/11/2020  ? Left ovarian cyst   ? 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 Dysmenorrhea 09/22/2017  ? Pelvic pain 09/15/2017  ? Irregular intermenstrual bleeding 09/15/2017  ? ? ?  Plan ?   ?We discussed the role of  further excision/incision at this point in time.  I believe neither of these are for a guarantee of resolution, or prevention of recurrence.  As she has not used her antibiotics, I have advised she keep those on the shelf should she develop recurring inflammatory changes or pain. ?We discussed options of utilizing warm compresses now to ensure that there is no residual hypergranular cyst that may be a point of recurrence.  We have agreed to continue to observe her excellent progress, and review we remain readily available should she have a recurrence of the abscess. ? ? ?Face-to-face time spent with the patient and accompanying care providers(if present) was 20 minutes, with more than 50% of the time spent counseling, educating, and coordinating care of the patient.   ? ?These notes generated with voice recognition software. I apologize for typographical errors. ? ?Campbell Lernerenny Raygan Skarda M.D., FACS ?06/24/2021, 9:30 AM ? ? ? ? ?

## 2021-07-04 ENCOUNTER — Encounter: Payer: Self-pay | Admitting: Obstetrics & Gynecology

## 2021-08-02 ENCOUNTER — Encounter: Payer: Self-pay | Admitting: Advanced Practice Midwife

## 2021-08-02 ENCOUNTER — Ambulatory Visit (INDEPENDENT_AMBULATORY_CARE_PROVIDER_SITE_OTHER): Payer: 59 | Admitting: Advanced Practice Midwife

## 2021-08-02 VITALS — BP 120/72 | Ht 63.0 in | Wt 169.0 lb

## 2021-08-02 DIAGNOSIS — R519 Headache, unspecified: Secondary | ICD-10-CM | POA: Diagnosis not present

## 2021-08-02 DIAGNOSIS — N926 Irregular menstruation, unspecified: Secondary | ICD-10-CM

## 2021-08-02 DIAGNOSIS — O26891 Other specified pregnancy related conditions, first trimester: Secondary | ICD-10-CM | POA: Diagnosis not present

## 2021-08-02 LAB — POCT URINE PREGNANCY: Preg Test, Ur: POSITIVE — AB

## 2021-08-02 NOTE — Patient Instructions (Signed)
Morning Sickness ? ?Morning sickness is when a woman feels nauseous during pregnancy. This nauseous feeling may or may not come with vomiting. It often occurs in the morning, but it can be a problem at any time of day. Morning sickness is most common during the first trimester. In some cases, it may continue throughout pregnancy. Although morning sickness is unpleasant, it is usually harmless unless the woman develops severe and continual vomiting (hyperemesis gravidarum), a condition that requires more intense treatment. ?What are the causes? ?The exact cause of this condition is not known, but it seems to be related to normal hormonal changes that occur in pregnancy. ?What increases the risk? ?You are more likely to develop this condition if: ?You experienced nausea or vomiting before your pregnancy. ?You had morning sickness during a previous pregnancy. ?You are pregnant with more than one baby, such as twins. ?What are the signs or symptoms? ?Symptoms of this condition include: ?Nausea. ?Vomiting. ?How is this diagnosed? ?This condition is usually diagnosed based on your signs and symptoms. ?How is this treated? ?In many cases, treatment is not needed for this condition. Making some changes to what you eat may help to control symptoms. Your health care provider may also prescribe or recommend: ?Vitamin B6 supplements. ?Anti-nausea medicines. ?Ginger. ?Follow these instructions at home: ?Medicines ?Take over-the-counter and prescription medicines only as told by your health care provider. Do not use any prescription, over-the-counter, or herbal medicines for morning sickness without first talking with your health care provider. ?Take multivitamins before getting pregnant. This can prevent or decrease the severity of morning sickness in most women. ?Eating and drinking ?Eat a piece of dry toast or crackers before getting out of bed in the morning. ?Eat 5 or 6 small meals a day. ?Eat dry and bland foods, such as  rice or a baked potato. Foods that are high in carbohydrates are often helpful. ?Avoid greasy, fatty, and spicy foods. ?Have someone cook for you if the smell of any food causes nausea and vomiting. ?If you feel nauseous after taking prenatal vitamins, take the vitamins at night or with a snack. ?Eat a protein snack between meals if you are hungry. Nuts, yogurt, and cheese are good options. ?Drink fluids throughout the day. ?Try ginger ale made with real ginger, ginger tea made from fresh grated ginger, or ginger candies. ?General instructions ?Do not use any products that contain nicotine or tobacco. These products include cigarettes, chewing tobacco, and vaping devices, such as e-cigarettes. If you need help quitting, ask your health care provider. ?Get an air purifier to keep the air in your house free of odors. ?Get plenty of fresh air. ?Try to avoid odors that trigger your nausea. ?Consider trying these methods to help relieve symptoms: ?Wearing an acupressure wristband. These wristbands are often worn for seasickness. ?Acupuncture. ?Contact a health care provider if: ?Your home remedies are not working and you need medicine. ?You feel dizzy or light-headed. ?You are losing weight. ?Get help right away if: ?You have persistent and uncontrolled nausea and vomiting. ?You faint. ?You have severe pain in your abdomen. ?Summary ?Morning sickness is when a woman feels nauseous during pregnancy. This nauseous feeling may or may not come with vomiting. ?Morning sickness is most common during the first trimester. ?It often occurs in the morning, but it can be a problem at any time of day. ?In many cases, treatment is not needed for this condition. Making some changes to what you eat may help to control symptoms. ?This  information is not intended to replace advice given to you by your health care provider. Make sure you discuss any questions you have with your health care provider. ?Document Revised: 11/04/2019 Document  Reviewed: 10/14/2019 ?Elsevier Patient Education ? 2023 [employer] ?First Trimester of Pregnancy ? ?The first trimester of pregnancy starts on the first day of your last menstrual period until the end of week 12. This is months 1 through 3 of pregnancy. A week after a sperm fertilizes an egg, the egg will implant into the wall of the uterus and begin to develop into a baby. By the end of 12 weeks, all the baby's organs will be formed and the baby will be 2-3 inches in size. ?Body changes during your first trimester ?Your body goes through many changes during pregnancy. The changes vary and generally return to normal after your baby is born. ?Physical changes ?You may gain or lose weight. ?Your breasts may begin to grow larger and become tender. The tissue that surrounds your nipples (areola) may become darker. ?Dark spots or blotches (chloasma or mask of pregnancy) may develop on your face. ?You may have changes in your hair. These can include thickening or thinning of your hair or changes in texture. ?Health changes ?You may feel nauseous, and you may vomit. ?You may have heartburn. ?You may develop headaches. ?You may develop constipation. ?Your gums may bleed and may be sensitive to brushing and flossing. ?Other changes ?You may tire easily. ?You may urinate more often. ?Your menstrual periods will stop. ?You may have a loss of appetite. ?You may develop cravings for certain kinds of food. ?You may have changes in your emotions from day to day. ?You may have more vivid and strange dreams. ?Follow these instructions at home: ?Medicines ?Follow your health care provider's instructions regarding medicine use. Specific medicines may be either safe or unsafe to take during pregnancy. Do not take any medicines unless told to by your health care provider. ?Take a prenatal vitamin that contains at least 600 micrograms (mcg) of folic acid. ?Eating and drinking ?Eat a healthy diet that includes fresh fruits and  vegetables, whole grains, good sources of protein such as meat, eggs, or tofu, and low-fat dairy products. ?Avoid raw meat and unpasteurized juice, milk, and cheese. These carry germs that can harm you and your baby. ?If you feel nauseous or you vomit: ?Eat 4 or 5 small meals a day instead of 3 large meals. ?Try eating a few soda crackers. ?Drink liquids between meals instead of during meals. ?You may need to take these actions to prevent or treat constipation: ?Drink enough fluid to keep your urine pale yellow. ?Eat foods that are high in fiber, such as beans, whole grains, and fresh fruits and vegetables. ?Limit foods that are high in fat and processed sugars, such as fried or sweet foods. ?Activity ?Exercise only as directed by your health care provider. Most people can continue their usual exercise routine during pregnancy. Try to exercise for 30 minutes at least 5 days a week. ?Stop exercising if you develop pain or cramping in the lower abdomen or lower back. ?Avoid exercising if it is very hot or humid or if you are at high altitude. ?Avoid heavy lifting. ?If you choose to, you may have sex unless your health care provider tells you not to. ?Relieving pain and discomfort ?Wear a good support bra to relieve breast tenderness. ?Rest with your legs elevated if you have leg cramps or low back pain. ?If you  develop bulging veins (varicose veins) in your legs: ?Wear support hose as told by your health care provider. ?Elevate your feet for 15 minutes, 3-4 times a day. ?Limit salt in your diet. ?Safety ?Wear your seat belt at all times when driving or riding in a car. ?Talk with your health care provider if someone is verbally or physically abusive to you. ?Talk with your health care provider if you are feeling sad or have thoughts of hurting yourself. ?Lifestyle ?Do not use hot tubs, steam rooms, or saunas. ?Do not douche. Do not use tampons or scented sanitary pads. ?Do not use herbal remedies, alcohol, illegal  drugs, or medicines that are not approved by your health care provider. Chemicals in these products can harm your baby. ?Do not use any products that contain nicotine or tobacco, such as cigarettes, e-cigarettes,

## 2021-08-03 NOTE — Progress Notes (Signed)
? ?Patient ID: Keyah N Otwell, female   DOB: 06/09/1990, 31 y.o.   MRN: [id number] ? ?Reason for Visit: Desires clinical confirmation of pregnancy, headache. ? ? ?Subjective:  ?Date of Service: 08/02/2021 ? ?HPI: ? ?Lihanna N Viele is a 31 y.o. female being seen for clinical UPT. She has had 2 at home positive pregnancy test. Her LMP is 06/24/21. The pregnancy was not planned- she and her boyfriend recently separated. The pregnancy is wanted. Her chief complaints are nausea, vomiting, headache. She has had the headache daily for the past week. It is frontal. She has a history of migraines- sometimes associated with her menstrual cycle. She denies adequate sleep and hydration and she stopped drinking coffee a few weeks ago. She denies congestion. Comfort measures and safe medications reviewed.  ? ?She has follow up NOB appointments scheduled.  ? ?Past Medical History:  ?Diagnosis Date  ? Asthma   ? teenager  ? Dysmenorrhea   ? History of kidney stones   ? Left ovarian cyst 08/2019  ? Sleep apnea 2017  ? does not wear cpap  ? ?Family History  ?Problem Relation Age of Onset  ? Thyroid disease Mother   ? Hypertension Brother   ? Liver disease Brother   ? Colon cancer Maternal Aunt 52  ? Diabetes Father   ? Hypertension Father   ? Heart Problems Father   ? ?Past Surgical History:  ?Procedure Laterality Date  ? CYSTOSCOPY  08/08/2019  ? LAPAROSCOPIC OVARIAN CYSTECTOMY Left 08/15/2019  ? Procedure: LAPAROSCOPIC OVARIAN CYSTECTOMY;  Surgeon: Nadara Mustard, MD;  Location: [facility] ORS;  Service: Gynecology;  Laterality: Left;  ? WISDOM TOOTH EXTRACTION  07/17/2016  ? ? ?Short Social History:  ?Social History  ? ?Tobacco Use  ? Smoking status: Every Day  ?  Packs/day: 0.10  ?  Types: Cigarettes  ? Smokeless tobacco: Never  ?Substance Use Topics  ? Alcohol use: Yes  ?  Comment: Occasional  ? ? ?No Known Allergies ? ?Current Outpatient Medications  ?Medication Sig Dispense Refill  ? Cholecalciferol (VITAMIN D3) 1.25 MG (50000 UT)  CAPS Take 1 capsule by mouth once a week.    ? cyclobenzaprine (FLEXERIL) 10 MG tablet Take 10 mg by mouth at bedtime as needed. (Patient not taking: Reported on 08/02/2021)    ? ?No current facility-administered medications for this visit.  ? ? ?Review of Systems  ?Constitutional:  Negative for chills and fever.  ?HENT:  Negative for congestion, ear discharge, ear pain, hearing loss, sinus pain and sore throat.   ?Eyes:  Negative for blurred vision and double vision.  ?Respiratory:  Negative for cough, shortness of breath and wheezing.   ?Cardiovascular:  Negative for chest pain, palpitations and leg swelling.  ?Gastrointestinal:  Positive for nausea and vomiting. Negative for abdominal pain, blood in stool, constipation, diarrhea, heartburn and melena.  ?Genitourinary:  Negative for dysuria, flank pain, frequency, hematuria and urgency.  ?Musculoskeletal:  Negative for back pain, joint pain and myalgias.  ?Skin:  Negative for itching and rash.  ?Neurological:  Positive for headaches. Negative for dizziness, tingling, tremors, sensory change, speech change, focal weakness, seizures, loss of consciousness and weakness.  ?Endo/Heme/Allergies:  Negative for environmental allergies. Does not bruise/bleed easily.  ?Psychiatric/Behavioral:  Negative for depression, hallucinations, memory loss, substance abuse and suicidal ideas. The patient is not nervous/anxious and does not have insomnia.   ? ? ?   ?Objective:  ?Objective  ? ?Vitals:  ? 08/02/21 1112  ?BP: 120/72  ?Weight:  169 lb (76.7 kg)  ?Height: 5\' 3"  (1.6 m)  ? ?Body mass index is 29.94 kg/m?. ?Vital Signs: BP 120/72   Ht 5\' 3"  (1.6 m)   Wt 169 lb (76.7 kg)   LMP 06/24/2021 (Exact Date)   BMI 29.94 kg/m?  ?Constitutional: Well nourished, well developed female in no acute distress.  ?HEENT: normal ?Skin: Warm and dry.  ?Cardiovascular: Regular rate and rhythm.   ?Extremity: no edema ?Respiratory: Clear to auscultation bilateral. Normal respiratory effort ?Neuro:  DTRs 2+, Cranial nerves grossly intact ?Psych: Alert and Oriented x3. No memory deficits. Normal mood and affect.  ? ? ?Data: ? Latest Reference Range & Units 08/02/21 11:22  ?Preg Test, Ur Negative  Positive !  ?!: Data is abnormal ?_________________________________________________________ ?31 y.o. G0 P0 with positive pregnancy test ? ?Headache: increase hydration, increase sleep, caffeine, OTC magnesium (250 mg), sudafed if needed for congestion, tylenol ?Nausea/Vomiting: stay hydrated, unisom/B6, small frequent meals, f/u PRN ?See AVS for additional information ?Return for scheduled NOB appointments ? ? ?10/02/21 CNM ?[facility] ?[facility] ?08/03/2021, 11:51 AM ? ? ?

## 2021-08-07 ENCOUNTER — Other Ambulatory Visit: Payer: Self-pay | Admitting: Obstetrics

## 2021-08-07 ENCOUNTER — Encounter: Payer: Self-pay | Admitting: Advanced Practice Midwife

## 2021-08-07 MED ORDER — PROMETHAZINE HCL 25 MG PO TABS: 25.0000 mg | ORAL_TABLET | Freq: Four times a day (QID) | ORAL | 1 refills | Status: DC | PRN

## 2021-08-07 MED ORDER — ONDANSETRON 4 MG PO TBDP: 4.0000 mg | ORAL_TABLET | Freq: Three times a day (TID) | ORAL | 1 refills | Status: DC | PRN

## 2021-08-07 NOTE — Progress Notes (Signed)
Spoke to [employer] on the phone. She states that her nausea and vomiting have gotten worse over the past day. She is able to keep down sips of water but has been vomiting any food she tries to eat. Rx for ondansetron ODT and promethazine 25 mg PO sent. Instructed to take the ondansetron and then use promethazine if she is still nauseated. Consider taking on a schedule x 24-48 hours if needed. Advised to go to ED for IV hydration if N/V persists, she is unable to keep down fluids, or has other s/s of dehydration. Najae verbalizes understanding. ? ?Guadlupe Spanish, CNM  ?

## 2021-08-09 ENCOUNTER — Encounter: Payer: Self-pay | Admitting: Advanced Practice Midwife

## 2021-08-10 ENCOUNTER — Other Ambulatory Visit: Payer: Self-pay | Admitting: Obstetrics

## 2021-08-10 MED ORDER — ONDANSETRON 4 MG PO TBDP: 4.0000 mg | ORAL_TABLET | Freq: Three times a day (TID) | ORAL | 1 refills | Status: DC | PRN

## 2021-08-10 NOTE — Telephone Encounter (Signed)
Can you send in a Nausea medication for her?

## 2021-08-11 ENCOUNTER — Ambulatory Visit (INDEPENDENT_AMBULATORY_CARE_PROVIDER_SITE_OTHER): Payer: 59

## 2021-08-11 DIAGNOSIS — Z3480 Encounter for supervision of other normal pregnancy, unspecified trimester: Secondary | ICD-10-CM | POA: Insufficient documentation

## 2021-08-11 DIAGNOSIS — Z3689 Encounter for other specified antenatal screening: Secondary | ICD-10-CM

## 2021-08-11 NOTE — Initial Assessments (Signed)
New OB Intake ? ?I connected with  Carol Carter on 08/11/21 at  8:15 AM EDT by telephone Telephone Visit and verified that I am speaking with the correct person using two identifiers. Nurse is located at [facility] and pt is located at work. ? ?I explained I am completing New OB Intake today. We discussed her EDD of 03/31/2022 that is based on LMP of 06/24/2021. Pt is G1/P0. I reviewed her allergies, medications, Medical/Surgical/OB history, and appropriate screenings. Based on history, this is a/an  pregnancy uncomplicated .  ? ?Patient Active Problem List  ? Diagnosis Date Noted  ? Furuncle of left axilla 06/24/2021  ? BV (bacterial vaginosis) 03/11/2020  ? Left ovarian cyst   ? 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 Dysmenorrhea 09/22/2017  ? Pelvic pain 09/15/2017  ? Irregular intermenstrual bleeding 09/15/2017  ? ? ?Concerns addressed today ?Pt is going out of the country 08/19/21 for two weeks.  Adv her dating scan could be done when she gets back.  Adv to get up and walk around on the plane and stay hydrated - 64oz water a day. ? ?Delivery Plans:  ?Plans to deliver at [facility]  ? ? [place] ?Explained first scheduled [place] will be scheduled at her first visit with provider. ? ?Labs ?Pt aware NOB labs will be drawn at first visit with provider. ? ?Patient has covid vaccine.   ? ?Social Determinants of Health ?Food Insecurity: Patient denies food insecurity.  ?Transportation: Patient denies transportation needs. ? ?Placed OB Box on problem list and updated ? ?First visit review ?I reviewed new OB appt with pt. I explained she will have a pelvic exam, ob bloodwork with genetic screening, and PAP smear. Explained pt will be seen by Dr. Catalina Antigua at first visit; encounter routed to appropriate provider.   ? ?Loran Senters, [facility]) ?08/11/2021  8:09 AM  ?

## 2021-08-17 ENCOUNTER — Encounter: Payer: Self-pay | Admitting: Advanced Practice Midwife

## 2021-08-18 ENCOUNTER — Ambulatory Visit (INDEPENDENT_AMBULATORY_CARE_PROVIDER_SITE_OTHER): Payer: 59 | Admitting: Obstetrics and Gynecology

## 2021-08-18 ENCOUNTER — Other Ambulatory Visit (HOSPITAL_COMMUNITY)
Admission: RE | Admit: 2021-08-18 | Discharge: 2021-08-18 | Disposition: A | Discharge status: Home / Self Care | Payer: 59 | Source: Ambulatory Visit | Attending: Obstetrics and Gynecology | Admitting: Obstetrics and Gynecology

## 2021-08-18 ENCOUNTER — Encounter: Payer: Self-pay | Admitting: Obstetrics and Gynecology

## 2021-08-18 VITALS — BP 120/80 | Wt 172.0 lb

## 2021-08-18 DIAGNOSIS — Z3480 Encounter for supervision of other normal pregnancy, unspecified trimester: Secondary | ICD-10-CM

## 2021-08-18 MED ORDER — VITAFOL GUMMIES 3.33-0.333-34.8 MG PO CHEW: 2.0000 | CHEWABLE_TABLET | Freq: Every day | ORAL | 6 refills | Status: AC

## 2021-08-18 MED ORDER — PROMETHAZINE HCL 25 MG PO TABS: 25.0000 mg | ORAL_TABLET | Freq: Four times a day (QID) | ORAL | 2 refills | Status: DC | PRN

## 2021-08-18 NOTE — Patient Instructions (Signed)
First Trimester of Pregnancy  The first trimester of pregnancy starts on the first day of your last menstrual period until the end of week 12. This is months 1 through 3 of pregnancy. A week after a sperm fertilizes an egg, the egg will implant into the wall of the uterus and begin to develop into a baby. By the end of 12 weeks, all the baby's organs will be formed and the baby will be 2-3 inches in size. Body changes during your first trimester Your body goes through many changes during pregnancy. The changes vary and generally return to normal after your baby is born. Physical changes You may gain or lose weight. Your breasts may begin to grow larger and become tender. The tissue that surrounds your nipples (areola) may become darker. Dark spots or blotches (chloasma or mask of pregnancy) may develop on your face. You may have changes in your hair. These can include thickening or thinning of your hair or changes in texture. Health changes You may feel nauseous, and you may vomit. You may have heartburn. You may develop headaches. You may develop constipation. Your gums may bleed and may be sensitive to brushing and flossing. Other changes You may tire easily. You may urinate more often. Your menstrual periods will stop. You may have a loss of appetite. You may develop cravings for certain kinds of food. You may have changes in your emotions from day to day. You may have more vivid and strange dreams. Follow these instructions at home: Medicines Follow your health care provider's instructions regarding medicine use. Specific medicines may be either safe or unsafe to take during pregnancy. Do not take any medicines unless told to by your health care provider. Take a prenatal vitamin that contains at least 600 micrograms (mcg) of folic acid. Eating and drinking Eat a healthy diet that includes fresh fruits and vegetables, whole grains, good sources of protein such as meat, eggs, or tofu,  and low-fat dairy products. Avoid raw meat and unpasteurized juice, milk, and cheese. These carry germs that can harm you and your baby. If you feel nauseous or you vomit: Eat 4 or 5 small meals a day instead of 3 large meals. Try eating a few soda crackers. Drink liquids between meals instead of during meals. You may need to take these actions to prevent or treat constipation: Drink enough fluid to keep your urine pale yellow. Eat foods that are high in fiber, such as beans, whole grains, and fresh fruits and vegetables. Limit foods that are high in fat and processed sugars, such as fried or sweet foods. Activity Exercise only as directed by your health care provider. Most people can continue their usual exercise routine during pregnancy. Try to exercise for 30 minutes at least 5 days a week. Stop exercising if you develop pain or cramping in the lower abdomen or lower back. Avoid exercising if it is very hot or humid or if you are at high altitude. Avoid heavy lifting. If you choose to, you may have sex unless your health care provider tells you not to. Relieving pain and discomfort Wear a good support bra to relieve breast tenderness. Rest with your legs elevated if you have leg cramps or low back pain. If you develop bulging veins (varicose veins) in your legs: Wear support hose as told by your health care provider. Elevate your feet for 15 minutes, 3-4 times a day. Limit salt in your diet. Safety Wear your seat belt at all times when   driving or riding in a car. Talk with your health care provider if someone is verbally or physically abusive to you. Talk with your health care provider if you are feeling sad or have thoughts of hurting yourself. Lifestyle Do not use hot tubs, steam rooms, or saunas. Do not douche. Do not use tampons or scented sanitary pads. Do not use herbal remedies, alcohol, illegal drugs, or medicines that are not approved by your health care provider. Chemicals  in these products can harm your baby. Do not use any products that contain nicotine or tobacco, such as cigarettes, e-cigarettes, and chewing tobacco. If you need help quitting, ask your health care provider. Avoid cat litter boxes and soil used by cats. These carry germs that can cause birth defects in the baby and possibly loss of the unborn baby (fetus) by miscarriage or stillbirth. General instructions During routine prenatal visits in the first trimester, your health care provider will do a physical exam, perform necessary tests, and ask you how things are going. Keep all follow-up visits. This is important. Ask for help if you have counseling or nutritional needs during pregnancy. Your health care provider can offer advice or refer you to specialists for help with various needs. Schedule a dentist appointment. At home, brush your teeth with a soft toothbrush. Floss gently. Write down your questions. Take them to your prenatal visits. Where to find more information American Pregnancy Association: americanpregnancy.org American College of Obstetricians and Gynecologists: acog.org/en/Womens%20Health/Pregnancy Office on Women's Health: womenshealth.gov/pregnancy Contact a health care provider if you have: Dizziness. A fever. Mild pelvic cramps, pelvic pressure, or nagging pain in the abdominal area. Nausea, vomiting, or diarrhea that lasts for 24 hours or longer. A bad-smelling vaginal discharge. Pain when you urinate. Known exposure to a contagious illness, such as chickenpox, measles, Zika virus, HIV, or hepatitis. Get help right away if you have: Spotting or bleeding from your vagina. Severe abdominal cramping or pain. Shortness of breath or chest pain. Any kind of trauma, such as from a fall or a car crash. New or increased pain, swelling, or redness in an arm or leg. Summary The first trimester of pregnancy starts on the first day of your last menstrual period until the end of week  12 (months 1 through 3). Eating 4 or 5 small meals a day rather than 3 large meals may help to relieve nausea and vomiting. Do not use any products that contain nicotine or tobacco, such as cigarettes, e-cigarettes, and chewing tobacco. If you need help quitting, ask your health care provider. Keep all follow-up visits. This is important. This information is not intended to replace advice given to you by your health care provider. Make sure you discuss any questions you have with your health care provider. Document Revised: 08/28/2019 Document Reviewed: 07/04/2019 Elsevier Patient Education  2023 Elsevier Inc.  

## 2021-08-18 NOTE — Progress Notes (Signed)
?  Subjective:  ? ? Carol Carter is a G1P0000 [redacted]w[redacted]d being seen today for her first obstetrical visit.  Her obstetrical history is significant for maternal obesity. Patient does intend to breast feed. Pregnancy history fully reviewed. ? ?Patient reports nausea. ? ?Vitals:  ? 08/18/21 1437  ?BP: 120/80  ?Weight: 172 lb (78 kg)  ? ? ?HISTORY: ?OB History  ?Gravida Para Term Preterm AB Living  ?1 0 0 0 0 0  ?SAB IAB Ectopic Multiple Live Births  ?0 0 0 0 0  ?  ?# Outcome Date GA Lbr Len/2nd Weight Sex Delivery Anes PTL Lv  ?1 Current           ? ?Past Medical History:  ?Diagnosis Date  ? Asthma   ? teenager  ? Dysmenorrhea   ? History of kidney stones   ? Left ovarian cyst 08/2019  ? Sleep apnea 2017  ? does not wear cpap  ? ?Past Surgical History:  ?Procedure Laterality Date  ? CYSTOSCOPY  08/08/2019  ? LAPAROSCOPIC OVARIAN CYSTECTOMY Left 08/15/2019  ? Procedure: LAPAROSCOPIC OVARIAN CYSTECTOMY;  Surgeon: Nadara Mustard, MD;  Location: [facility] ORS;  Service: Gynecology;  Laterality: Left;  ? WISDOM TOOTH EXTRACTION  07/17/2016  ? ?Family History  ?Problem Relation Age of Onset  ? Thyroid disease Mother   ? Hypertension Brother   ? Liver disease Brother   ? Colon cancer Maternal Aunt 52  ? Diabetes Father   ? Hypertension Father   ? Heart Problems Father   ? ? ? ?Exam  ? ? ?Uterus:   8-weeks  ?Pelvic Exam:   ? Perineum: No Hemorrhoids, Normal Perineum  ? Vulva: normal  ? Vagina:  normal mucosa, normal discharge  ? pH:   ? Cervix: nulliparous appearance and closed and long  ? Adnexa: normal adnexa and no mass, fullness, tenderness  ? Bony Pelvis: gynecoid  ?System: Breast:  normal appearance, no masses or tenderness  ? Skin: normal coloration and turgor, no rashes ?  ? Neurologic: oriented, no focal deficits  ? Extremities: normal strength, tone, and muscle mass  ? HEENT extra ocular movement intact  ? Mouth/Teeth mucous membranes moist, pharynx normal without lesions and dental hygiene good  ? Neck supple  ?  Cardiovascular: regular rate and rhythm  ? Respiratory:  appears well, vitals normal, no respiratory distress, acyanotic, normal RR, chest clear, no wheezing, crepitations, rhonchi, normal symmetric air entry  ? Abdomen: soft, non-tender; bowel sounds normal; no masses,  no organomegaly  ? Urinary:   ? ? ?  ?Assessment:  ? ? Pregnancy: G1P0000 ?Patient Active Problem List  ? Diagnosis Date Noted  ? Supervision of other normal pregnancy, antepartum 08/11/2021  ? Furuncle of left axilla 06/24/2021  ? BV (bacterial vaginosis) 03/11/2020  ? Left ovarian cyst   ? 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 Dysmenorrhea 09/22/2017  ? Pelvic pain 09/15/2017  ? Irregular intermenstrual bleeding 09/15/2017  ? ?  ? ?  ?Plan:  ? ?  ?Initial labs drawn. ?Prenatal vitamins. ?Problem list reviewed and updated. ?Genetic Screening discussed : panorama next visit. ? Ultrasound discussed; fetal survey: will be ordered at a later visit. ?A1c ordered ?ASA to be started at 12 weeks- rx at next visit given maternal obesity ? Follow up in 4 weeks. ?50% of 30 min visit spent on counseling and coordination of care.  ?  ? ?[name] ?08/18/2021 ? ? ?

## 2021-08-20 LAB — CBC/D/PLT+RPR+RH+ABO+RUBIGG...
Antibody Screen: NEGATIVE
Basophils Absolute: 0 10*3/uL (ref 0.0–0.2)
Basos: 0 %
EOS (ABSOLUTE): 0.3 10*3/uL (ref 0.0–0.4)
Eos: 3 %
HCV Ab: NONREACTIVE
HIV Screen 4th Generation wRfx: NONREACTIVE
Hematocrit: 32.8 % — ABNORMAL LOW (ref 34.0–46.6)
Hemoglobin: 11.2 g/dL (ref 11.1–15.9)
Hepatitis B Surface Ag: NEGATIVE
Immature Grans (Abs): 0 10*3/uL (ref 0.0–0.1)
Immature Granulocytes: 0 %
Lymphocytes Absolute: 2.6 10*3/uL (ref 0.7–3.1)
Lymphs: 27 %
MCH: 29.5 pg (ref 26.6–33.0)
MCHC: 34.1 g/dL (ref 31.5–35.7)
MCV: 86 fL (ref 79–97)
Monocytes Absolute: 0.8 10*3/uL (ref 0.1–0.9)
Monocytes: 8 %
Neutrophils Absolute: 5.9 10*3/uL (ref 1.4–7.0)
Neutrophils: 62 %
Platelets: 415 10*3/uL (ref 150–450)
RBC: 3.8 x10E6/uL (ref 3.77–5.28)
RDW: 13.1 % (ref 11.7–15.4)
RPR Ser Ql: NONREACTIVE
Rh Factor: POSITIVE
Rubella Antibodies, IGG: 1.03 index (ref >0.99)
WBC: 9.6 10*3/uL (ref 3.4–10.8)

## 2021-08-20 LAB — HEMOGLOBIN A1C
Est. average glucose Bld gHb Est-mCnc: 120 mg/dL
Hgb A1c MFr Bld: 5.8 % — ABNORMAL HIGH (ref 4.8–5.6)

## 2021-08-20 LAB — CERVICOVAGINAL ANCILLARY ONLY
Chlamydia: NEGATIVE
Comment: NEGATIVE
Comment: NORMAL
Neisseria Gonorrhea: NEGATIVE

## 2021-08-20 LAB — HCV INTERPRETATION

## 2021-08-20 NOTE — Progress Notes (Signed)
Pt aware and will do at her next appointment, pt states she is on a plane and about to leave the country.

## 2021-09-02 DIAGNOSIS — Z419 Encounter for procedure for purposes other than remedying health state, unspecified: Secondary | ICD-10-CM | POA: Diagnosis not present

## 2021-09-05 ENCOUNTER — Inpatient Hospital Stay (HOSPITAL_COMMUNITY)
Admission: AD | Admit: 2021-09-05 | Discharge: 2021-09-05 | Disposition: A | Discharge status: Home / Self Care | Payer: 59 | Attending: Obstetrics and Gynecology | Admitting: Obstetrics and Gynecology

## 2021-09-05 ENCOUNTER — Other Ambulatory Visit: Payer: Self-pay

## 2021-09-05 ENCOUNTER — Encounter (HOSPITAL_COMMUNITY): Payer: Self-pay | Admitting: Obstetrics and Gynecology

## 2021-09-05 DIAGNOSIS — O219 Vomiting of pregnancy, unspecified: Secondary | ICD-10-CM | POA: Insufficient documentation

## 2021-09-05 DIAGNOSIS — Z3A10 10 weeks gestation of pregnancy: Secondary | ICD-10-CM

## 2021-09-05 LAB — URINALYSIS, ROUTINE W REFLEX MICROSCOPIC
Bilirubin Urine: NEGATIVE
Glucose, UA: NEGATIVE mg/dL
Hgb urine dipstick: NEGATIVE
Ketones, ur: 20 mg/dL — AB
Leukocytes,Ua: NEGATIVE
Nitrite: NEGATIVE
Protein, ur: NEGATIVE mg/dL
Specific Gravity, Urine: 1.014 (ref 1.005–1.030)
pH: 7 (ref 5.0–8.0)

## 2021-09-05 MED ORDER — FAMOTIDINE IN NACL 20-0.9 MG/50ML-% IV SOLN
20.0000 mg | Freq: Once | INTRAVENOUS | Status: AC
  Administered 2021-09-05: 20 mg via INTRAVENOUS
  Filled 2021-09-05: qty 50

## 2021-09-05 MED ORDER — ONDANSETRON HCL 4 MG/2ML IJ SOLN
4.0000 mg | Freq: Once | INTRAMUSCULAR | Status: AC
  Administered 2021-09-05: 4 mg via INTRAVENOUS
  Filled 2021-09-05: qty 2

## 2021-09-05 MED ORDER — LACTATED RINGERS IV BOLUS
1000.0000 mL | Freq: Once | INTRAVENOUS | Status: AC
  Administered 2021-09-05: 1000 mL via INTRAVENOUS

## 2021-09-05 MED ORDER — METOCLOPRAMIDE HCL 5 MG/ML IJ SOLN
10.0000 mg | Freq: Once | INTRAMUSCULAR | Status: AC
  Administered 2021-09-05: 10 mg via INTRAVENOUS
  Filled 2021-09-05: qty 2

## 2021-09-05 NOTE — MAU Note (Signed)
Carol Carter is a 31 y.o. at [redacted]w[redacted]d here in MAU reporting: worsening nausea and vomiting since yesterday. >10 episodes of emesis. States meds at home not working. Took phenergan last night around 2100. Has not taken zofran ODT since Friday. Having LUQ cramping and lower abdominal cramping. Has a headache.   Onset of complaint: yesterday  Pain score: LUQ 7/10, lower abdomen 4/10, headache 7/10  Vitals:   09/05/21 0858  BP: 122/81  Pulse: 99  Resp: 16  Temp: 99 F (37.2 C)  SpO2: 99%     Lab orders placed from triage: UA

## 2021-09-05 NOTE — MAU Provider Note (Signed)
History     [id number]  Arrival date and time: 09/05/21 5465    Chief Complaint  Patient presents with   Nausea     HPI Carol Carter is a 31 y.o. at [redacted]w[redacted]d who presents for vomiting. This has been ongoing with the pregnancy but worse since yesterday. States she has vomited 10 times since last night. Took phenergan last night but unable to keep it down. No antiemetics today. Denies fever, abdominal pain, diarrhea, or vaginal bleeding.    OB History     Gravida  1   Para  0   Term  0   Preterm  0   AB  0   Living  0      SAB  0   IAB  0   Ectopic  0   Multiple  0   Live Births  0           Past Medical History:  Diagnosis Date   Asthma    teenager   Dysmenorrhea    History of kidney stones    Left ovarian cyst 08/2019   Sleep apnea 2017   does not wear cpap    Past Surgical History:  Procedure Laterality Date   CYSTOSCOPY  08/08/2019   LAPAROSCOPIC OVARIAN CYSTECTOMY Left 08/15/2019   Procedure: LAPAROSCOPIC OVARIAN CYSTECTOMY;  Surgeon: Nadara Mustard, MD;  Location: [facility] ORS;  Service: Gynecology;  Laterality: Left;   WISDOM TOOTH EXTRACTION  07/17/2016    Family History  Problem Relation Age of Onset   Thyroid disease Mother    Hypertension Brother    Liver disease Brother    Colon cancer Maternal Aunt 52   Diabetes Father    Hypertension Father    Heart Problems Father     No Known Allergies  No current facility-administered medications on file prior to encounter.   Current Outpatient Medications on File Prior to Encounter  Medication Sig Dispense Refill   ondansetron (ZOFRAN-ODT) 4 MG disintegrating tablet Take 1 tablet (4 mg total) by mouth every 8 (eight) hours as needed for nausea or vomiting. 20 tablet 1   Prenatal Vit-Fe Phos-FA-Omega (VITAFOL GUMMIES) 3.33-0.333-34.8 MG CHEW Chew 2 tablets by mouth daily. 60 tablet 6   promethazine (PHENERGAN) 25 MG tablet Take 1 tablet (25 mg total) by mouth every 6 (six) hours as  needed for nausea or vomiting. 30 tablet 2   Cholecalciferol (VITAMIN D3) 1.25 MG (50000 UT) CAPS Take 1 capsule by mouth once a week. (Patient not taking: Reported on 08/18/2021)     cyclobenzaprine (FLEXERIL) 10 MG tablet Take 10 mg by mouth at bedtime as needed. (Patient not taking: Reported on 08/02/2021)       ROS Pertinent positives and negative per HPI, all others reviewed and negative  Physical Exam   BP 114/76 (BP Location: Right Arm)   Pulse (!) 102   Temp 97.9 F (36.6 C) (Oral)   Resp 18   Ht 5\' 3"  (1.6 m)   Wt 76.2 kg   LMP 06/24/2021 (Exact Date)   SpO2 100%   BMI 29.76 kg/m   Patient Vitals for the past 24 hrs:  BP Temp Temp src Pulse Resp SpO2 Height Weight  09/05/21 0929 114/76 97.9 F (36.6 C) Oral (!) 102 18 100 % -- --  09/05/21 0858 122/81 99 F (37.2 C) Oral 99 16 99 % -- --  09/05/21 0854 -- -- -- -- -- -- 5\' 3"  (1.6 m) 76.2 kg  Physical Exam Vitals and nursing note reviewed.  Constitutional:      General: She is not in acute distress.    Appearance: Normal appearance.  HENT:     Head: Normocephalic and atraumatic.  Eyes:     General: No scleral icterus.    Conjunctiva/sclera: Conjunctivae normal.  Pulmonary:     Effort: Pulmonary effort is normal. No respiratory distress.  Neurological:     Mental Status: She is alert.  Psychiatric:        Mood and Affect: Mood normal.        Behavior: Behavior normal.       Bedside Ultrasound Pt informed that the ultrasound is considered a limited OB ultrasound and is not intended to be a complete ultrasound exam.  Patient also informed that the ultrasound is not being completed with the intent of assessing for fetal or placental anomalies or any pelvic abnormalities.  Explained that the purpose of today's ultrasound is to assess for  viability.  Patient acknowledges the purpose of the exam and the limitations of the study.     My interpretation: live IUP with FHR 169 bpm   Labs Results for orders  placed or performed during the hospital encounter of 09/05/21 (from the past 24 hour(s))  Urinalysis, Routine w reflex microscopic Urine, Clean Catch     Status: Abnormal   Collection Time: 09/05/21  9:39 AM  Result Value Ref Range   Color, Urine YELLOW YELLOW   APPearance HAZY (A) CLEAR   Specific Gravity, Urine 1.014 1.005 - 1.030   pH 7.0 5.0 - 8.0   Glucose, UA NEGATIVE NEGATIVE mg/dL   Hgb urine dipstick NEGATIVE NEGATIVE   Bilirubin Urine NEGATIVE NEGATIVE   Ketones, ur 20 (A) NEGATIVE mg/dL   Protein, ur NEGATIVE NEGATIVE mg/dL   Nitrite NEGATIVE NEGATIVE   Leukocytes,Ua NEGATIVE NEGATIVE    Imaging No results found.  MAU Course  Procedures Lab Orders         Urinalysis, Routine w reflex microscopic Urine, Clean Catch     Meds ordered this encounter  Medications   lactated ringers bolus 1,000 mL   famotidine (PEPCID) IVPB 20 mg premix   metoCLOPramide (REGLAN) injection 10 mg   lactated ringers bolus 1,000 mL   ondansetron (ZOFRAN) injection 4 mg   Imaging Orders  No imaging studies ordered today    MDM IV fluids, pepcid, phenergan, & zofran given in MAU. Reports improvement in symptoms & no longer vomiting.   Reviewed records at [facility] available in Epic Assessment and Plan   1. Nausea and vomiting during pregnancy prior to [redacted] weeks gestation   2. [redacted] weeks gestation of pregnancy   -Continue meds as previously prescribed. Discussed taking antiemetics on a schedule & placing phenergan vaginally as needed -reviewed reasons to return to MAU   Judeth Horn, NP 09/05/21 1:16 PM

## 2021-09-08 ENCOUNTER — Encounter: Payer: Self-pay | Admitting: Obstetrics and Gynecology

## 2021-09-15 ENCOUNTER — Encounter: Payer: 59 | Admitting: Licensed Practical Nurse

## 2021-09-15 ENCOUNTER — Ambulatory Visit (INDEPENDENT_AMBULATORY_CARE_PROVIDER_SITE_OTHER): Payer: 59 | Admitting: Obstetrics

## 2021-09-15 VITALS — BP 118/70 | Wt 176.0 lb

## 2021-09-15 DIAGNOSIS — Z3A11 11 weeks gestation of pregnancy: Secondary | ICD-10-CM

## 2021-09-15 DIAGNOSIS — Z3480 Encounter for supervision of other normal pregnancy, unspecified trimester: Secondary | ICD-10-CM

## 2021-09-15 DIAGNOSIS — Z1379 Encounter for other screening for genetic and chromosomal anomalies: Secondary | ICD-10-CM

## 2021-09-15 DIAGNOSIS — O99211 Obesity complicating pregnancy, first trimester: Secondary | ICD-10-CM

## 2021-09-15 NOTE — Progress Notes (Signed)
Pt has had some light spotting, bright pink. No lof. Went to hospital over the weekend for dehydration. On meds for nause

## 2021-09-15 NOTE — Progress Notes (Signed)
Routine Prenatal Care Visit  Subjective  Carol Carter is a 31 y.o. G1P0000 at [redacted]w[redacted]d being seen today for ongoing prenatal care.  She is currently monitored for the following issues for this low-risk pregnancy and has Pelvic pain; Irregular intermenstrual bleeding; LGSIL on Pap smear of cervix; Dysmenorrhea; Left ovarian cyst; BV (bacterial vaginosis); Furuncle of left axilla; and Supervision of other normal pregnancy, antepartum on their problem list.  ----------------------------------------------------------------------------------- Patient reports nausea.    . Vag. Bleeding: Scant.   . Leaking Fluid denies.  ----------------------------------------------------------------------------------- The following portions of the patient's history were reviewed and updated as appropriate: allergies, current medications, past family history, past medical history, past social history, past surgical history and problem list. Problem list updated.  Objective  Blood pressure 118/70, weight 176 lb (79.8 kg), last menstrual period 06/24/2021. Pregravid weight 175 lb (79.4 kg) Total Weight Gain 1 lb (0.454 kg) Urinalysis: Urine Protein    Urine Glucose    Fetal Status:           General:  Alert, oriented and cooperative. Patient is in no acute distress.  Skin: Skin is warm and dry. No rash noted.   Cardiovascular: Normal heart rate noted  Respiratory: Normal respiratory effort, no problems with respiration noted  Abdomen: Soft, gravid, appropriate for gestational age. Pain/Pressure: Absent     Pelvic:  Cervical exam performed        Extremities: Normal range of motion.     Mental Status: Normal mood and affect. Normal behavior. Normal judgment and thought content.   Assessment   31 y.o. G1P0000 at [redacted]w[redacted]d by  03/31/2022, by Last Menstrual Period presenting for routine prenatal visit  Plan   FIRST Problems (from 08/11/21 to present)    Problem Noted Resolved   Supervision of other normal  pregnancy, antepartum 08/11/2021 by Loran Senters, CMA No   Overview Addendum 08/20/2021  7:53 AM by Catalina Antigua, MD     Nursing Staff Provider  Office Location  [facility] Dating    [facility] Model [ ]  Traditional [ ]  Centering [ ]  Mom-Baby Dyad    Language   Anatomy    Flu Vaccine   Genetic/Carrier Screen  NIPS:    AFP:    Horizon:  TDaP Vaccine    Hgb A1C or  GTT Early A1C 5.8 Third trimester   COVID Vaccine    LAB RESULTS   Rhogam   Blood Type A/Positive/-- (05/17 1521)   Baby Feeding Plan  Antibody Negative (05/17 1521)  Contraception  Rubella 1.03 (05/17 1521)  Circumcision  RPR Non Reactive (05/17 1521)   Pediatrician   HBsAg Negative (05/17 1521)   Support Person  HCVAb neg  Prenatal Classes  HIV Non Reactive (05/17 1521)     BTL Consent  GBS   (For PCN allergy, check sensitivities)   VBAC Consent  Pap        DME Rx [ ]  BP cuff [ ]  Weight Scale Waterbirth  [ ]  Class [ ]  Consent [ ]  CNM visit  PHQ9 & GAD7 [  ] new OB [  ] 28 weeks  [  ] 36 weeks Induction  [ ]  Orders Entered [ ] Foley Y/N             Preterm labor symptoms and general obstetric precautions including but not limited to vaginal bleeding, contractions, leaking of fluid and fetal movement were reviewed in detail with the patient. Please refer to After Visit Summary for other counseling recommendations.  Spec  exam shows no bleeding today. Her nausea is improving. Continues with Zofran and PRN Phenergan. Dating scan ordered for her today, She will also have an early 1hr GTT. MaternT today as well. Urine culture sent and she is started on daily ASA.   Return in about 4 weeks (around 10/13/2021) for return OB.  Mirna Mires, CNM  09/15/2021 3:51 PM

## 2021-09-18 LAB — URINE CULTURE, OB REFLEX

## 2021-09-18 LAB — CULTURE, OB URINE

## 2021-09-20 ENCOUNTER — Other Ambulatory Visit: Payer: Self-pay

## 2021-09-20 ENCOUNTER — Other Ambulatory Visit: Payer: 59

## 2021-09-20 DIAGNOSIS — O99211 Obesity complicating pregnancy, first trimester: Secondary | ICD-10-CM

## 2021-09-20 DIAGNOSIS — Z3480 Encounter for supervision of other normal pregnancy, unspecified trimester: Secondary | ICD-10-CM

## 2021-09-20 LAB — MATERNIT 21 PLUS CORE, BLOOD
Fetal Fraction: 6
Result (T21): NEGATIVE
Trisomy 13 (Patau syndrome): NEGATIVE
Trisomy 18 (Edwards syndrome): NEGATIVE
Trisomy 21 (Down syndrome): NEGATIVE

## 2021-09-21 LAB — GLUCOSE, 1 HOUR GESTATIONAL: Gestational Diabetes Screen: 95 mg/dL (ref 70–139)

## 2021-09-23 ENCOUNTER — Ambulatory Visit
Admission: RE | Admit: 2021-09-23 | Discharge: 2021-09-23 | Disposition: A | Discharge status: Home / Self Care | Payer: 59 | Source: Ambulatory Visit | Attending: Obstetrics | Admitting: Obstetrics

## 2021-09-23 DIAGNOSIS — Z3687 Encounter for antenatal screening for uncertain dates: Secondary | ICD-10-CM | POA: Diagnosis present

## 2021-09-23 DIAGNOSIS — Z3480 Encounter for supervision of other normal pregnancy, unspecified trimester: Secondary | ICD-10-CM

## 2021-09-23 DIAGNOSIS — Z3A13 13 weeks gestation of pregnancy: Secondary | ICD-10-CM | POA: Diagnosis not present

## 2021-09-27 ENCOUNTER — Encounter: Payer: Self-pay | Admitting: Obstetrics

## 2021-10-02 DIAGNOSIS — Z419 Encounter for procedure for purposes other than remedying health state, unspecified: Secondary | ICD-10-CM | POA: Diagnosis not present

## 2021-10-12 ENCOUNTER — Ambulatory Visit: Payer: 59 | Admitting: Surgery

## 2021-10-14 ENCOUNTER — Encounter: Payer: Self-pay | Admitting: Surgery

## 2021-10-14 ENCOUNTER — Ambulatory Visit (INDEPENDENT_AMBULATORY_CARE_PROVIDER_SITE_OTHER): Payer: 59 | Admitting: Surgery

## 2021-10-14 ENCOUNTER — Ambulatory Visit (INDEPENDENT_AMBULATORY_CARE_PROVIDER_SITE_OTHER): Payer: 59 | Admitting: Obstetrics

## 2021-10-14 VITALS — BP 110/77 | HR 83 | Temp 99.2°F | Ht 63.0 in | Wt 179.0 lb

## 2021-10-14 VITALS — BP 120/80 | Wt 178.0 lb

## 2021-10-14 DIAGNOSIS — L02211 Cutaneous abscess of abdominal wall: Secondary | ICD-10-CM

## 2021-10-14 DIAGNOSIS — Z3480 Encounter for supervision of other normal pregnancy, unspecified trimester: Secondary | ICD-10-CM

## 2021-10-14 DIAGNOSIS — Z3A16 16 weeks gestation of pregnancy: Secondary | ICD-10-CM

## 2021-10-14 NOTE — Progress Notes (Signed)
Routine Prenatal Care Visit  Subjective  Carol Carter is a 31 y.o. G1P0000 at [redacted]w[redacted]d being seen today for ongoing prenatal care.  She is currently monitored for the following issues for this low-risk pregnancy and has Pelvic pain; Irregular intermenstrual bleeding; LGSIL on Pap smear of cervix; Dysmenorrhea; Left ovarian cyst; BV (bacterial vaginosis); Furuncle of left axilla; and Supervision of other normal pregnancy, antepartum on their problem list.  ----------------------------------------------------------------------------------- Patient reports no complaints.  Her N/V has pretty much ceased.  . Vag. Bleeding: None.   . Leaking Fluid denies.  ----------------------------------------------------------------------------------- The following portions of the patient's history were reviewed and updated as appropriate: allergies, current medications, past family history, past medical history, past social history, past surgical history and problem list. Problem list updated.  Objective  Blood pressure 120/80, weight 178 lb (80.7 kg), last menstrual period 06/24/2021. Pregravid weight 175 lb (79.4 kg) Total Weight Gain 3 lb (1.361 kg) Urinalysis: Urine Protein    Urine Glucose    Fetal Status:           General:  Alert, oriented and cooperative. Patient is in no acute distress.  Skin: Skin is warm and dry. No rash noted.   Cardiovascular: Normal heart rate noted  Respiratory: Normal respiratory effort, no problems with respiration noted  Abdomen: Soft, gravid, appropriate for gestational age. Pain/Pressure: Present     Pelvic:  Cervical exam deferred        Extremities: Normal range of motion.     Mental Status: Normal mood and affect. Normal behavior. Normal judgment and thought content.   Assessment   31 y.o. G1P0000 at [redacted]w[redacted]d by  03/31/2022, by Last Menstrual Period presenting for routine prenatal visit  Plan   FIRST Problems (from 08/11/21 to present)    Problem Noted Resolved    Supervision of other normal pregnancy, antepartum 08/11/2021 by Loran Senters, CMA No   Overview Addendum 09/29/2021  8:26 AM by Mirna Mires, CNM     Nursing Staff Provider  Office Location  [facility] Dating   EDD set  [facility] Model [ ]  Traditional [ ]  Centering [ ]  Mom-Baby Dyad    Language   Anatomy    Flu Vaccine   Genetic/Carrier Screen  NIPS:   xy per MaternT AFP:    Horizon:  TDaP Vaccine    Hgb A1C or  GTT Early A1C 5.8, 95 Third trimester   COVID Vaccine    LAB RESULTS   Rhogam   Blood Type     Baby Feeding Plan  Antibody    Contraception  Rubella    Circumcision  RPR     Pediatrician   HBsAg     Support Person  HCVAb neg  Prenatal Classes  HIV       BTL Consent  GBS  seen in urine at NOB- + (For PCN allergy, check sensitivities)   VBAC Consent  Pap        DME Rx [ ]  BP cuff [ ]  Weight Scale Waterbirth  [ ]  Class [ ]  Consent [ ]  CNM visit  PHQ9 & GAD7 [  ] new OB [  ] 28 weeks  [  ] 36 weeks Induction  [ ]  Orders Entered [ ] Foley Y/N             Preterm labor symptoms and general obstetric precautions including but not limited to vaginal bleeding, contractions, leaking of fluid and fetal movement were reviewed in detail with the patient. Please refer to  After Visit Summary for other counseling recommendations.  Ultrasound ordered.  Return in about 4 weeks (around 11/11/2021) for return OB, review sono.  Mirna Mires, CNM  10/14/2021 8:23 AM

## 2021-10-14 NOTE — Patient Instructions (Addendum)
If you have any concerns or questions, please feel free to call our office. Follow up as needed.  ? ?Skin Abscess ? ?A skin abscess is an infected area of your skin that contains pus and other material. An abscess can happen in any part of your body. Some abscesses break open (rupture) on their own. Most continue to get worse unless they are treated. The infection can spread deeper into the body and into your blood, which can make you feel sick. ?A skin abscess is caused by germs that enter the skin through a cut or scrape. It can also be caused by blocked oil and sweat glands or infected hair follicles. ?This condition is usually treated by: ?Draining the pus. ?Taking antibiotic medicines. ?Placing a warm, wet washcloth over the abscess. ?Follow these instructions at home: ?Medicines ? ?Take over-the-counter and prescription medicines only as told by your doctor. ?If you were prescribed an antibiotic medicine, take it as told by your doctor. Do not stop taking the antibiotic even if you start to feel better. ?Abscess care ? ?If you have an abscess that has not drained, place a warm, clean, wet washcloth over the abscess several times a day. Do this as told by your doctor. ?Follow instructions from your doctor about how to take care of your abscess. Make sure you: ?Cover the abscess with a bandage (dressing). ?Change your bandage or gauze as told by your doctor. ?Wash your hands with soap and water before you change the bandage or gauze. If you cannot use soap and water, use hand sanitizer. ?Check your abscess every day for signs that the infection is getting worse. Check for: ?More redness, swelling, or pain. ?More fluid or blood. ?Warmth. ?More pus or a bad smell. ?General instructions ?To avoid spreading the infection: ?Do not share personal care items, towels, or hot tubs with others. ?Avoid making skin-to-skin contact with other people. ?Keep all follow-up visits as told by your doctor. This is  important. ?Contact a doctor if: ?You have more redness, swelling, or pain around your abscess. ?You have more fluid or blood coming from your abscess. ?Your abscess feels warm when you touch it. ?You have more pus or a bad smell coming from your abscess. ?Your muscles ache. ?You feel sick. ?Get help right away if: ?You have very bad (severe) pain. ?You see red streaks on your skin spreading away from the abscess. ?You see redness that spreads quickly. ?You have a fever or chills. ?Summary ?A skin abscess is an infected area of your skin that contains pus and other material. ?The abscess is caused by germs that enter the skin through a cut or scrape. It can also be caused by blocked oil and sweat glands or infected hair follicles. ?Follow your doctor's instructions on caring for your abscess, taking medicines, preventing infections, and keeping follow-up visits. ?This information is not intended to replace advice given to you by your health care provider. Make sure you discuss any questions you have with your health care provider. ?Document Revised: 12/28/2020 Document Reviewed: 12/28/2020 ?Elsevier Patient Education ? 2023 Elsevier Inc. ? ?

## 2021-10-14 NOTE — Progress Notes (Signed)
Patient ID: Carol Carter, female   DOB: 06/25/1990, 31 y.o.   MRN: [id number]  Chief Complaint: Right lower quadrant abdominal wall abscess  History of Present Illness [name] is a 31 y.o. female with history of right lower quad abdominal wall abscess, popped and began draining 2 days ago.  Previously seen, no antibiotics initiated.  Minimal drainage minimal bleeding.  Denies fevers and chills.  She is currently [redacted] weeks pregnant. She reported she had had previous boils develop in her axillary area.  Not a current issue at present.  Past Medical History Past Medical History:  Diagnosis Date   Asthma    teenager   Dysmenorrhea    History of kidney stones    Left ovarian cyst 08/2019   Sleep apnea 2017   does not wear cpap      Past Surgical History:  Procedure Laterality Date   CYSTOSCOPY  08/08/2019   LAPAROSCOPIC OVARIAN CYSTECTOMY Left 08/15/2019   Procedure: LAPAROSCOPIC OVARIAN CYSTECTOMY;  Surgeon: Nadara Mustard, MD;  Location: [facility] ORS;  Service: Gynecology;  Laterality: Left;   WISDOM TOOTH EXTRACTION  07/17/2016    No Known Allergies  Current Outpatient Medications  Medication Sig Dispense Refill   ondansetron (ZOFRAN-ODT) 4 MG disintegrating tablet Take 1 tablet (4 mg total) by mouth every 8 (eight) hours as needed for nausea or vomiting. 20 tablet 1   promethazine (PHENERGAN) 25 MG tablet Take 1 tablet (25 mg total) by mouth every 6 (six) hours as needed for nausea or vomiting. 30 tablet 2   No current facility-administered medications for this visit.    Family History Family History  Problem Relation Age of Onset   Thyroid disease Mother    Hypertension Brother    Liver disease Brother    Colon cancer Maternal Aunt 14   Diabetes Father    Hypertension Father    Heart Problems Father       Social History Social History   Tobacco Use   Smoking status: Former    Packs/day: 0.10    Types: Cigarettes    Quit date: 07/28/2021    Years since  quitting: 0.2   Smokeless tobacco: Never  Vaping Use   Vaping Use: Never used  Substance Use Topics   Alcohol use: Not Currently    Comment: Occasional   Drug use: Not Currently    Types: Marijuana    Comment: over 10 years ago        Review of Systems  Constitutional: Negative.   HENT: Negative.    Eyes: Negative.   Respiratory: Negative.    Cardiovascular: Negative.   Gastrointestinal: Negative.   Genitourinary: Negative.   Skin: Negative.   Neurological: Negative.   Psychiatric/Behavioral: Negative.    All other systems reviewed and are negative.     Physical Exam Blood pressure 110/77, pulse 83, temperature 99.2 F (37.3 C), temperature source Oral, height 5\' 3"  (1.6 m), weight 179 lb (81.2 kg), last menstrual period 06/24/2021, SpO2 98 %. Last Weight  Most recent update: 10/14/2021  1:02 PM    Weight  81.2 kg (179 lb)             CONSTITUTIONAL: Well developed, and nourished, appropriately responsive and aware without distress.   EYES: Sclera non-icteric.   EARS, NOSE, MOUTH AND THROAT:  The oropharynx is clear. Oral mucosa is pink and moist.   Hearing is intact to voice.  NECK: Trachea is midline, and there is no jugular venous distension.  LYMPH NODES:  Lymph nodes in the neck are not enlarged. RESPIRATORY:  Lungs are clear, and breath sounds are equal bilaterally. Normal respiratory effort without pathologic use of accessory muscles. CARDIOVASCULAR: Heart is regular in rate and rhythm. GI: The abdomen is soft, nontender, and nondistended. There were no palpable masses. I did not appreciate hepatosplenomegaly. There were normal bowel sounds. Skin and right lower quadrant has a small 5 to 7 mm opening, which after Betadine prep I was able to probe with a Q-tip to the Q-tip head of maximal depth.  The depth is actually undermining laterally.  And the cavity itself remains quite superficial.  There appears to be excellent granulation, without evidence of residual  sebaceous cyst.  So there is actually no core to excise.  I then proceeded with probing the wound and treating it with silver nitrate stick.  This was well-tolerated.  The undermining is about a centimeter or less.  Certainly not a sufficient lead deep or large wound to require further packing.  We reapplied a dressing. MUSCULOSKELETAL:  Symmetrical muscle tone appreciated in all four extremities.    SKIN: Skin turgor is normal. No pathologic skin lesions appreciated.  NEUROLOGIC:  Motor and sensation appear grossly normal.  Cranial nerves are grossly without defect. PSYCH:  Alert and oriented to person, place and time. Affect is appropriate for situation.  Data Reviewed I have personally reviewed what is currently available of the patient's imaging, recent labs and medical records.   Labs:     Latest Ref Rng & Units 08/18/2021    3:21 PM 04/16/2021    1:55 PM 08/13/2019    9:11 AM  CBC  WBC 3.4 - 10.8 x10E3/uL 9.6  5.1  7.2   Hemoglobin 11.1 - 15.9 g/dL 16.1  09.6  04.5   Hematocrit 34.0 - 46.6 % 32.8  35.2  37.8   Platelets 150 - 450 x10E3/uL 415  418  456       Latest Ref Rng & Units 05/08/2017    1:15 PM 01/02/2017    4:06 PM 08/29/2016   11:32 AM  CMP  Glucose 65 - 99 mg/dL 93  96  99   BUN 6 - 20 mg/dL 9  9  7    Creatinine 0.44 - 1.00 mg/dL  4.09  8.11   Sodium 135 - 145 mmol/L 137  139  138   Potassium 3.5 - 5.1 mmol/L 3.5  3.7  3.8   Chloride 101 - 111 mmol/L 103  105  107   CO2 22 - 32 mmol/L 27  29  26    Calcium 8.9 - 10.3 mg/dL 9.2  9.1  9.1   Total Protein 6.5 - 8.1 g/dL 8.6  7.8  8.3   Total Bilirubin 0.3 - 1.2 mg/dL 0.6  0.3  0.7   Alkaline Phos 38 - 126 U/L 57  44  63   AST 15 - 41 U/L 19  17  24    ALT 14 - 54 U/L 12  11  17       Imaging:  Within last 24 hrs: No results found.  Assessment    Right lower quadrant abdominal wall abscess.  Status post spontaneous drainage. 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  Monitor for potential recurrence.  At present I do not see a role for antibiotics.  We will be glad to see her back should this area recur or fail to heal as expected.  Face-to-face time spent with the patient and accompanying care providers(if present) was 20 minutes, with more than 50% of the time spent counseling, educating, and coordinating care of the patient.    These notes generated with voice recognition software. I apologize for typographical errors.  Campbell Lerner M.D., FACS 10/14/2021, 1:10 PM

## 2021-10-26 ENCOUNTER — Ambulatory Visit: Payer: 59

## 2021-11-01 ENCOUNTER — Ambulatory Visit
Admission: RE | Admit: 2021-11-01 | Discharge: 2021-11-01 | Disposition: A | Discharge status: Home / Self Care | Payer: 59 | Source: Ambulatory Visit | Attending: Obstetrics | Admitting: Obstetrics

## 2021-11-01 DIAGNOSIS — Z3480 Encounter for supervision of other normal pregnancy, unspecified trimester: Secondary | ICD-10-CM | POA: Insufficient documentation

## 2021-11-01 DIAGNOSIS — Z3A18 18 weeks gestation of pregnancy: Secondary | ICD-10-CM | POA: Insufficient documentation

## 2021-11-01 DIAGNOSIS — Z3689 Encounter for other specified antenatal screening: Secondary | ICD-10-CM | POA: Insufficient documentation

## 2021-11-02 DIAGNOSIS — Z419 Encounter for procedure for purposes other than remedying health state, unspecified: Secondary | ICD-10-CM | POA: Diagnosis not present

## 2021-11-04 ENCOUNTER — Other Ambulatory Visit: Payer: Self-pay | Admitting: Obstetrics

## 2021-11-04 DIAGNOSIS — Z3480 Encounter for supervision of other normal pregnancy, unspecified trimester: Secondary | ICD-10-CM

## 2021-11-04 NOTE — Progress Notes (Signed)
[id number]

## 2021-11-11 ENCOUNTER — Ambulatory Visit (INDEPENDENT_AMBULATORY_CARE_PROVIDER_SITE_OTHER): Payer: 59 | Admitting: Licensed Practical Nurse

## 2021-11-11 VITALS — BP 120/80 | Wt 186.0 lb

## 2021-11-11 DIAGNOSIS — Z3A20 20 weeks gestation of pregnancy: Secondary | ICD-10-CM

## 2021-11-11 DIAGNOSIS — Z3480 Encounter for supervision of other normal pregnancy, unspecified trimester: Secondary | ICD-10-CM

## 2021-11-11 NOTE — Progress Notes (Signed)
Routine Prenatal Care Visit  Subjective  Carol Carter is a 31 y.o. G1P0000 at [redacted]w[redacted]d being seen today for ongoing prenatal care.  She is currently monitored for the following issues for this low-risk pregnancy and has Pelvic pain; Irregular intermenstrual bleeding; LGSIL on Pap smear of cervix; Dysmenorrhea; Left ovarian cyst; BV (bacterial vaginosis); Furuncle of left axilla; and Supervision of other normal pregnancy, antepartum on their problem list.  ----------------------------------------------------------------------------------- Patient reports fatigue and occasional nausea.  Has been feeling flutter, but not all of time, reviewed normal fetal movement at this gestation.  Mood has been good Encouraged looking into CBE   . Vag. Bleeding: None.  Movement: Present. Leaking Fluid denies.  ----------------------------------------------------------------------------------- The following portions of the patient's history were reviewed and updated as appropriate: allergies, current medications, past family history, past medical history, past social history, past surgical history and problem list. Problem list updated.  Objective  Blood pressure 120/80, weight 186 lb (84.4 kg), last menstrual period 06/24/2021. Pregravid weight 175 lb (79.4 kg) Total Weight Gain 11 lb (4.99 kg) Urinalysis: Urine Protein    Urine Glucose    Fetal Status:     Movement: Present     General:  Alert, oriented and cooperative. Patient is in no acute distress.  Skin: Skin is warm and dry. No rash noted.   Cardiovascular: Normal heart rate noted  Respiratory: Normal respiratory effort, no problems with respiration noted  Abdomen: Soft, gravid, appropriate for gestational age. Pain/Pressure: Present     Pelvic:  Cervical exam deferred        Extremities: Normal range of motion.     Mental Status: Normal mood and affect. Normal behavior. Normal judgment and thought content.   Assessment   31 y.o. G1P0000 at  [redacted]w[redacted]d by  03/31/2022, by Last Menstrual Period presenting for routine prenatal visit  Plan   FIRST Problems (from 08/11/21 to present)     Problem Noted Resolved   Supervision of other normal pregnancy, antepartum 08/11/2021 by Loran Senters, CMA No   Overview Addendum 09/29/2021  8:26 AM by Mirna Mires, CNM     Nursing Staff Provider  Office Location  [facility] Dating   EDD set  [facility] Model [ ]  Traditional [ ]  Centering [ ]  Mom-Baby Dyad    Language   Anatomy    Flu Vaccine   Genetic/Carrier Screen  NIPS:   xy per MaternT AFP:    Horizon:  TDaP Vaccine    Hgb A1C or  GTT Early A1C 5.8, 95 Third trimester   COVID Vaccine    LAB RESULTS   Rhogam   Blood Type     Baby Feeding Plan  Antibody    Contraception  Rubella    Circumcision  RPR     Pediatrician   HBsAg     Support Person  HCVAb neg  Prenatal Classes  HIV       BTL Consent  GBS  seen in urine at NOB- + (For PCN allergy, check sensitivities)   VBAC Consent  Pap        DME Rx [ ]  BP cuff [ ]  Weight Scale Waterbirth  [ ]  Class [ ]  Consent [ ]  CNM visit  PHQ9 & GAD7 [  ] new OB [  ] 28 weeks  [  ] 36 weeks Induction  [ ]  Orders Entered [ ] Foley Y/N               general obstetric precautions including but not  limited to vaginal bleeding, contractions, leaking of fluid and fetal movement were reviewed in detail with the patient. Please refer to After Visit Summary for other counseling recommendations.   Return in about 4 weeks (around 12/09/2021) for ROB.  Carie Caddy, CNM  Domingo Pulse, [facility]  11/11/21  4:36 PM

## 2021-11-19 ENCOUNTER — Ambulatory Visit
Admission: RE | Admit: 2021-11-19 | Discharge: 2021-11-19 | Disposition: A | Discharge status: Home / Self Care | Payer: 59 | Source: Ambulatory Visit | Attending: Obstetrics | Admitting: Obstetrics

## 2021-11-19 DIAGNOSIS — Z3689 Encounter for other specified antenatal screening: Secondary | ICD-10-CM | POA: Diagnosis not present

## 2021-11-19 DIAGNOSIS — Z3A20 20 weeks gestation of pregnancy: Secondary | ICD-10-CM | POA: Insufficient documentation

## 2021-11-19 DIAGNOSIS — Z3480 Encounter for supervision of other normal pregnancy, unspecified trimester: Secondary | ICD-10-CM | POA: Diagnosis present

## 2021-11-20 ENCOUNTER — Encounter: Payer: Self-pay | Admitting: Obstetrics

## 2021-11-22 NOTE — Telephone Encounter (Signed)
Please advise 

## 2021-12-03 DIAGNOSIS — Z419 Encounter for procedure for purposes other than remedying health state, unspecified: Secondary | ICD-10-CM | POA: Diagnosis not present

## 2021-12-09 ENCOUNTER — Ambulatory Visit (INDEPENDENT_AMBULATORY_CARE_PROVIDER_SITE_OTHER): Payer: 59 | Admitting: Advanced Practice Midwife

## 2021-12-09 ENCOUNTER — Encounter: Payer: Self-pay | Admitting: Advanced Practice Midwife

## 2021-12-09 ENCOUNTER — Other Ambulatory Visit (HOSPITAL_COMMUNITY)
Admission: RE | Admit: 2021-12-09 | Discharge: 2021-12-09 | Disposition: A | Discharge status: Home / Self Care | Payer: 59 | Source: Ambulatory Visit | Attending: Advanced Practice Midwife | Admitting: Advanced Practice Midwife

## 2021-12-09 VITALS — BP 120/80 | Wt 195.0 lb

## 2021-12-09 DIAGNOSIS — Z3482 Encounter for supervision of other normal pregnancy, second trimester: Secondary | ICD-10-CM | POA: Insufficient documentation

## 2021-12-09 DIAGNOSIS — N898 Other specified noninflammatory disorders of vagina: Secondary | ICD-10-CM

## 2021-12-09 DIAGNOSIS — Z130 Encounter for screening for diseases of the blood and blood-forming organs and certain disorders involving the immune mechanism: Secondary | ICD-10-CM

## 2021-12-09 DIAGNOSIS — Z3A24 24 weeks gestation of pregnancy: Secondary | ICD-10-CM | POA: Diagnosis present

## 2021-12-09 DIAGNOSIS — Z131 Encounter for screening for diabetes mellitus: Secondary | ICD-10-CM

## 2021-12-09 DIAGNOSIS — Z369 Encounter for antenatal screening, unspecified: Secondary | ICD-10-CM

## 2021-12-09 DIAGNOSIS — Z113 Encounter for screening for infections with a predominantly sexual mode of transmission: Secondary | ICD-10-CM

## 2021-12-09 NOTE — Progress Notes (Signed)
Routine Prenatal Care Visit  Subjective  Carol Carter is a 31 y.o. G1P0000 at [redacted]w[redacted]d being seen today for ongoing prenatal care.  She is currently monitored for the following issues for this low-risk pregnancy and has Pelvic pain; Irregular intermenstrual bleeding; LGSIL on Pap smear of cervix; Dysmenorrhea; Left ovarian cyst; BV (bacterial vaginosis); Furuncle of left axilla; and Supervision of other normal pregnancy, antepartum on their problem list.  ----------------------------------------------------------------------------------- Patient reports hip pain. Comfort measures reviewed. She also mentions vaginal odor and some milky discharge. She denies itching or irritation.  Aptima swab done.   Contractions: Not present. Vag. Bleeding: None.  Movement: Present. Leaking Fluid denies.  ----------------------------------------------------------------------------------- The following portions of the patient's history were reviewed and updated as appropriate: allergies, current medications, past family history, past medical history, past social history, past surgical history and problem list. Problem list updated.  Objective  Blood pressure 120/80, weight 195 lb (88.5 kg), last menstrual period 06/24/2021. Pregravid weight 175 lb (79.4 kg) Total Weight Gain 20 lb (9.072 kg) Urinalysis: Urine Protein    Urine Glucose    Fetal Status: Fetal Heart Rate (bpm): 145 Fundal Height: 25 cm Movement: Present     General:  Alert, oriented and cooperative. Patient is in no acute distress.  Skin: Skin is warm and dry. No rash noted.   Cardiovascular: Normal heart rate noted  Respiratory: Normal respiratory effort, no problems with respiration noted  Abdomen: Soft, gravid, appropriate for gestational age. Pain/Pressure: Present     Pelvic:  aptima swab for vaginitis        Extremities: Normal range of motion.  Edema: None  Mental Status: Normal mood and affect. Normal behavior. Normal judgment and thought  content.   Assessment   31 y.o. G1P0000 at [redacted]w[redacted]d by  03/31/2022, by Last Menstrual Period presenting for routine prenatal visit  Plan   FIRST Problems (from 08/11/21 to present)    Problem Noted Resolved   Supervision of other normal pregnancy, antepartum 08/11/2021 by Loran Senters, CMA No   Overview Addendum 11/22/2021 10:29 PM by Mirna Mires, CNM     Nursing Staff Provider  Office Location  [facility] Dating   EDD set  [facility] Model [ ]  Traditional [ ]  Centering [ ]  Mom-Baby Dyad    Language   Anatomy  [place] anatomy (gender reveal)  Flu Vaccine   Genetic/Carrier Screen  NIPS:   xy per MaternT AFP:    Horizon:  TDaP Vaccine    Hgb A1C or  GTT Early A1C 5.8, 95 Third trimester   COVID Vaccine    LAB RESULTS   Rhogam   Blood Type     Baby Feeding Plan  Antibody    Contraception  Rubella    Circumcision  RPR     Pediatrician   HBsAg     Support Person  HCVAb neg  Prenatal Classes  HIV       BTL Consent  GBS  seen in urine at NOB- + (For PCN allergy, check sensitivities)   VBAC Consent  Pap        DME Rx [ ]  BP cuff [ ]  Weight Scale Waterbirth  [ ]  Class [ ]  Consent [ ]  CNM visit  PHQ9 & GAD7 [  ] new OB [  ] 28 weeks  [  ] 36 weeks Induction  [ ]  Orders Entered [ ] Foley Y/N             Preterm labor symptoms and general obstetric  precautions including but not limited to vaginal bleeding, contractions, leaking of fluid and fetal movement were reviewed in detail with the patient. Please refer to After Visit Summary for other counseling recommendations.   Return in about 4 weeks (around 01/06/2022) for 28 wk labs and rob.  Tresea Mall, CNM 12/09/2021 4:28 PM

## 2021-12-13 LAB — CERVICOVAGINAL ANCILLARY ONLY
Bacterial Vaginitis (gardnerella): POSITIVE — AB
Candida Glabrata: NEGATIVE
Candida Vaginitis: NEGATIVE
Comment: NEGATIVE
Comment: NEGATIVE
Comment: NEGATIVE

## 2021-12-15 ENCOUNTER — Other Ambulatory Visit: Payer: Self-pay | Admitting: Advanced Practice Midwife

## 2021-12-15 DIAGNOSIS — B9689 Other specified bacterial agents as the cause of diseases classified elsewhere: Secondary | ICD-10-CM

## 2021-12-15 MED ORDER — METRONIDAZOLE 0.75 % VA GEL: 1.0000 | Freq: Every day | VAGINAL | 0 refills | Status: AC

## 2021-12-15 NOTE — Progress Notes (Signed)
Rx metro gel sent to treat BV Message sent to patient

## 2021-12-16 ENCOUNTER — Encounter: Payer: Self-pay | Admitting: Obstetrics

## 2021-12-20 ENCOUNTER — Ambulatory Visit (INDEPENDENT_AMBULATORY_CARE_PROVIDER_SITE_OTHER): Payer: 59 | Admitting: Obstetrics & Gynecology

## 2021-12-20 VITALS — BP 110/62 | Wt 197.2 lb

## 2021-12-20 DIAGNOSIS — Z3A25 25 weeks gestation of pregnancy: Secondary | ICD-10-CM

## 2021-12-20 LAB — POCT URINALYSIS DIPSTICK OB
Glucose, UA: NEGATIVE
POC,PROTEIN,UA: NEGATIVE

## 2021-12-20 NOTE — Progress Notes (Unsigned)
Subjective:    Carol Carter is a 31 y.o. female being seen today for her obstetrical visit. She is at [redacted]w[redacted]d gestation. Patient reports {sx:14538}. Fetal movement: {fetal movement:14572}.  Menstrual History: OB History     Gravida  1   Para  0   Term  0   Preterm  0   AB  0   Living  0      SAB  0   IAB  0   Ectopic  0   Multiple  0   Live Births  0           Menarche age: *** Patient's last menstrual period was 06/24/2021 (exact date).  LCP  {Common ambulatory SmartLinks:19316}  Review of Systems {ros; complete:30496}   Objective:    BP 110/62   Wt 197 lb 3.2 oz (89.4 kg)   LMP 06/24/2021 (Exact Date)   BMI 34.93 kg/m  FHT: *** BPM  Uterine Size: {fundal height:14540}     Assessment:    Pregnancy {numbers; 0-42:17906} and {numbers; 0-7:15237}/7 weeks   Plan:    {ob counseling 24-27:14519} Follow up in {numbers 0-4:31231} weeks.

## 2022-01-02 DIAGNOSIS — Z419 Encounter for procedure for purposes other than remedying health state, unspecified: Secondary | ICD-10-CM | POA: Diagnosis not present

## 2022-01-06 ENCOUNTER — Other Ambulatory Visit: Payer: 59

## 2022-01-06 ENCOUNTER — Ambulatory Visit (INDEPENDENT_AMBULATORY_CARE_PROVIDER_SITE_OTHER): Payer: 59 | Admitting: Obstetrics

## 2022-01-06 VITALS — BP 122/76 | HR 92

## 2022-01-06 DIAGNOSIS — Z369 Encounter for antenatal screening, unspecified: Secondary | ICD-10-CM

## 2022-01-06 DIAGNOSIS — Z3A28 28 weeks gestation of pregnancy: Secondary | ICD-10-CM

## 2022-01-06 DIAGNOSIS — O99213 Obesity complicating pregnancy, third trimester: Secondary | ICD-10-CM

## 2022-01-06 DIAGNOSIS — O09293 Supervision of pregnancy with other poor reproductive or obstetric history, third trimester: Secondary | ICD-10-CM

## 2022-01-06 DIAGNOSIS — Z3482 Encounter for supervision of other normal pregnancy, second trimester: Secondary | ICD-10-CM

## 2022-01-06 DIAGNOSIS — E669 Obesity, unspecified: Secondary | ICD-10-CM

## 2022-01-06 DIAGNOSIS — Z131 Encounter for screening for diabetes mellitus: Secondary | ICD-10-CM

## 2022-01-06 DIAGNOSIS — Z113 Encounter for screening for infections with a predominantly sexual mode of transmission: Secondary | ICD-10-CM

## 2022-01-06 DIAGNOSIS — Z130 Encounter for screening for diseases of the blood and blood-forming organs and certain disorders involving the immune mechanism: Secondary | ICD-10-CM

## 2022-01-06 NOTE — Progress Notes (Signed)
Routine Prenatal Care Visit  Subjective  Carol Carter is a 31 y.o. G1P0000 at [redacted]w[redacted]d being seen today for ongoing prenatal care.  She is currently monitored for the following issues for this low-risk pregnancy and has Pelvic pain; Irregular intermenstrual bleeding; LGSIL on Pap smear of cervix; Dysmenorrhea; Left ovarian cyst; BV (bacterial vaginosis); Furuncle of left axilla; and Supervision of other normal pregnancy, antepartum on their problem list.  ----------------------------------------------------------------------------------- Patient reports no complaints.  She is having her 28 week labs today.she is feeling a lot of fetal movement. Denies any problems. Contractions: Not present. Vag. Bleeding: None.  Movement: Present. Leaking Fluid denies.  ----------------------------------------------------------------------------------- The following portions of the patient's history were reviewed and updated as appropriate: allergies, current medications, past family history, past medical history, past social history, past surgical history and problem list. Problem list updated.  Objective  Blood pressure 122/76, pulse 92, last menstrual period 06/24/2021. Pregravid weight 79.4 kg Total Weight Gain 10.1 kg Urinalysis: Urine Protein    Urine Glucose    Fetal Status:     Movement: Present     General:  Alert, oriented and cooperative. Patient is in no acute distress.  Skin: Skin is warm and dry. No rash noted.   Cardiovascular: Normal heart rate noted  Respiratory: Normal respiratory effort, no problems with respiration noted  Abdomen: Soft, gravid, appropriate for gestational age. Pain/Pressure: Present     Pelvic:  Cervical exam deferred        Extremities: Normal range of motion.     Mental Status: Normal mood and affect. Normal behavior. Normal judgment and thought content.   Assessment   31 y.o. G1P0000 at [redacted]w[redacted]d by  03/31/2022, by Last Menstrual Period presenting for routine prenatal  visit  Plan   FIRST Problems (from 08/11/21 to present)    Problem Noted Resolved   Supervision of other normal pregnancy, antepartum 08/11/2021 by Cleophas Dunker, CMA No   Overview Addendum 11/22/2021 10:29 PM by Imagene Riches, CNM     Nursing Staff Provider  Office Location  [facility] Dating   EDD set  [facility] Model [ ]  Traditional [ ]  Centering [ ]  Mom-Baby Dyad    Language   Anatomy US  [place] anatomy (gender reveal)  Flu Vaccine   Genetic/Carrier Screen  NIPS:   xy per MaternT AFP:    Horizon:  TDaP Vaccine    Hgb A1C or  GTT Early A1C 5.8, 95 Third trimester   COVID Vaccine    LAB RESULTS   Rhogam   Blood Type     Baby Feeding Plan  Antibody    Contraception  Rubella    Circumcision  RPR     Pediatrician   HBsAg     Support Person  HCVAb neg  Prenatal Classes  HIV       BTL Consent  GBS  seen in urine at NOB- + (For PCN allergy, check sensitivities)   VBAC Consent  Pap        DME Rx [ ]  BP cuff [ ]  Weight Scale Waterbirth  [ ]  Class [ ]  Consent [ ]  CNM visit  PHQ9 & GAD7 [  ] new OB [  ] 28 weeks  [  ] 36 weeks Induction  [ ]  Orders Entered [ ] Foley Y/N             Preterm labor symptoms and general obstetric precautions including but not limited to vaginal bleeding, contractions, leaking of fluid and fetal movement were reviewed  in detail with the patient. Please refer to After Visit Summary for other counseling recommendations.  She measures larger for dates today- likely due to body habitus. I have ordered a growth scan for her and would consider another at 36 weeks.  Return in about 2 weeks (around 01/20/2022) for return OB, review her 28 wk labs.  Mirna Mires, CNM  01/06/2022 9:35 AM

## 2022-01-07 ENCOUNTER — Other Ambulatory Visit: Payer: Self-pay | Admitting: Advanced Practice Midwife

## 2022-01-07 DIAGNOSIS — R7309 Other abnormal glucose: Secondary | ICD-10-CM

## 2022-01-07 LAB — 28 WEEK RH+PANEL
Basophils Absolute: 0 10*3/uL (ref 0.0–0.2)
Basos: 0 %
EOS (ABSOLUTE): 0.2 10*3/uL (ref 0.0–0.4)
Eos: 2 %
Gestational Diabetes Screen: 163 mg/dL — ABNORMAL HIGH (ref 70–139)
HIV Screen 4th Generation wRfx: NONREACTIVE
Hematocrit: 32.7 % — ABNORMAL LOW (ref 34.0–46.6)
Hemoglobin: 11.1 g/dL (ref 11.1–15.9)
Immature Grans (Abs): 0.1 10*3/uL (ref 0.0–0.1)
Immature Granulocytes: 1 %
Lymphocytes Absolute: 1.8 10*3/uL (ref 0.7–3.1)
Lymphs: 21 %
MCH: 30.2 pg (ref 26.6–33.0)
MCHC: 33.9 g/dL (ref 31.5–35.7)
MCV: 89 fL (ref 79–97)
Monocytes Absolute: 0.5 10*3/uL (ref 0.1–0.9)
Monocytes: 6 %
Neutrophils Absolute: 5.9 10*3/uL (ref 1.4–7.0)
Neutrophils: 70 %
Platelets: 330 10*3/uL (ref 150–450)
RBC: 3.68 x10E6/uL — ABNORMAL LOW (ref 3.77–5.28)
RDW: 12.2 % (ref 11.7–15.4)
RPR Ser Ql: NONREACTIVE
WBC: 8.4 10*3/uL (ref 3.4–10.8)

## 2022-01-10 ENCOUNTER — Telehealth: Payer: Self-pay | Admitting: Obstetrics

## 2022-01-10 NOTE — Telephone Encounter (Signed)
Reached out to pt to schedule follow up OB ultrasound.  Left message for pt to call back to schedule.

## 2022-01-14 ENCOUNTER — Other Ambulatory Visit: Payer: 59

## 2022-01-20 ENCOUNTER — Ambulatory Visit
Admission: RE | Admit: 2022-01-20 | Discharge: 2022-01-20 | Disposition: A | Discharge status: Home / Self Care | Payer: 59 | Source: Ambulatory Visit | Attending: Obstetrics | Admitting: Obstetrics

## 2022-01-20 ENCOUNTER — Encounter: Payer: Self-pay | Admitting: Obstetrics

## 2022-01-20 ENCOUNTER — Ambulatory Visit (INDEPENDENT_AMBULATORY_CARE_PROVIDER_SITE_OTHER): Payer: 59 | Admitting: Obstetrics

## 2022-01-20 VITALS — BP 117/81 | HR 109 | Wt 198.0 lb

## 2022-01-20 DIAGNOSIS — O99213 Obesity complicating pregnancy, third trimester: Secondary | ICD-10-CM | POA: Insufficient documentation

## 2022-01-20 DIAGNOSIS — Z3A29 29 weeks gestation of pregnancy: Secondary | ICD-10-CM | POA: Diagnosis not present

## 2022-01-20 DIAGNOSIS — Z3482 Encounter for supervision of other normal pregnancy, second trimester: Secondary | ICD-10-CM | POA: Diagnosis present

## 2022-01-20 DIAGNOSIS — Z3A30 30 weeks gestation of pregnancy: Secondary | ICD-10-CM

## 2022-01-20 DIAGNOSIS — Z3483 Encounter for supervision of other normal pregnancy, third trimester: Secondary | ICD-10-CM

## 2022-01-20 DIAGNOSIS — O3663X0 Maternal care for excessive fetal growth, third trimester, not applicable or unspecified: Secondary | ICD-10-CM | POA: Insufficient documentation

## 2022-01-20 LAB — POCT URINALYSIS DIPSTICK OB
Bilirubin, UA: NEGATIVE
Blood, UA: NEGATIVE
Ketones, UA: NEGATIVE
Leukocytes, UA: NEGATIVE
Nitrite, UA: NEGATIVE
POC,PROTEIN,UA: NEGATIVE
Spec Grav, UA: 1.02 (ref 1.010–1.025)
Urobilinogen, UA: 0.2 E.U./dL
pH, UA: 7.5 (ref 5.0–8.0)

## 2022-01-20 NOTE — Progress Notes (Addendum)
ROB at [redacted]w[redacted]d. Baby has been active. Denies ctx, LOF, and vaginal bleeding. Meda reports some tingling in the fingers on her right hand. Recommend wrist splint, exercises. She is having some occasional N/V but not as much as early in her pregnancy. She feels it is manageable right now. Doing daily kick counts. Reviewed s/s of PTL and when to go to the hospital. Desires flu shot today. Needs 3-hour GTT - will schedule today. Growth scan this afternoon. RTC in 2 weeks.  Lloyd Huger, CNM

## 2022-01-25 ENCOUNTER — Other Ambulatory Visit: Payer: 59

## 2022-01-25 DIAGNOSIS — R7309 Other abnormal glucose: Secondary | ICD-10-CM

## 2022-01-26 LAB — GESTATIONAL GLUCOSE TOLERANCE
Glucose, Fasting: 83 mg/dL (ref 70–94)
Glucose, GTT - 1 Hour: 155 mg/dL (ref 70–179)
Glucose, GTT - 2 Hour: 142 mg/dL (ref 70–154)
Glucose, GTT - 3 Hour: 77 mg/dL (ref 70–139)

## 2022-01-29 ENCOUNTER — Encounter: Payer: Self-pay | Admitting: Obstetrics

## 2022-01-29 DIAGNOSIS — O99210 Obesity complicating pregnancy, unspecified trimester: Secondary | ICD-10-CM | POA: Insufficient documentation

## 2022-02-02 DIAGNOSIS — Z419 Encounter for procedure for purposes other than remedying health state, unspecified: Secondary | ICD-10-CM | POA: Diagnosis not present

## 2022-02-03 ENCOUNTER — Encounter: Payer: Self-pay | Admitting: Licensed Practical Nurse

## 2022-02-03 ENCOUNTER — Ambulatory Visit (INDEPENDENT_AMBULATORY_CARE_PROVIDER_SITE_OTHER): Payer: 59 | Admitting: Licensed Practical Nurse

## 2022-02-03 VITALS — BP 128/80 | HR 104 | Wt 202.0 lb

## 2022-02-03 DIAGNOSIS — Z3A32 32 weeks gestation of pregnancy: Secondary | ICD-10-CM

## 2022-02-03 DIAGNOSIS — O99213 Obesity complicating pregnancy, third trimester: Secondary | ICD-10-CM

## 2022-02-03 DIAGNOSIS — E669 Obesity, unspecified: Secondary | ICD-10-CM

## 2022-02-03 DIAGNOSIS — Z3403 Encounter for supervision of normal first pregnancy, third trimester: Secondary | ICD-10-CM

## 2022-02-03 LAB — POCT URINALYSIS DIPSTICK OB
Bilirubin, UA: NEGATIVE
Blood, UA: NEGATIVE
Glucose, UA: NEGATIVE
Ketones, UA: NEGATIVE
Leukocytes, UA: NEGATIVE
Nitrite, UA: NEGATIVE
POC,PROTEIN,UA: NEGATIVE
Spec Grav, UA: 1.01 (ref 1.010–1.025)
Urobilinogen, UA: 0.2 E.U./dL
pH, UA: 7 (ref 5.0–8.0)

## 2022-02-03 NOTE — Progress Notes (Signed)
Routine Prenatal Care Visit  Subjective  Carol Carter is a 31 y.o. G1P0000 at [redacted]w[redacted]d being seen today for ongoing prenatal care.  She is currently monitored for the following issues for this low-risk pregnancy and has Pelvic pain; Irregular intermenstrual bleeding; LGSIL on Pap smear of cervix; Dysmenorrhea; Left ovarian cyst; BV (bacterial vaginosis); Furuncle of left axilla; Supervision of other normal pregnancy, antepartum; and Obesity in pregnancy, antepartum on their problem list.  ----------------------------------------------------------------------------------- Patient reports  swelling in hands, lower abd pressure .  Doing well. Mood is good. Has been watching a lot of videos of unmedicated birth. Desires unmedicated birth.  Her mother will be her primary support person.  Currently deciding on peds.  Contractions: Irritability.  .  Movement: Present. Leaking Fluid denies.  ----------------------------------------------------------------------------------- The following portions of the patient's history were reviewed and updated as appropriate: allergies, current medications, past family history, past medical history, past social history, past surgical history and problem list. Problem list updated.  Objective  Blood pressure 128/80, pulse (!) 104, weight 202 lb (91.6 kg), last menstrual period 06/24/2021. Pregravid weight 175 lb (79.4 kg) Total Weight Gain 27 lb (12.2 kg) Urinalysis: Urine Protein    Urine Glucose    Fetal Status: Fetal Heart Rate (bpm): 145 Fundal Height: 33 cm Movement: Present     General:  Alert, oriented and cooperative. Patient is in no acute distress.  Skin: Skin is warm and dry. No rash noted.   Cardiovascular: Normal heart rate noted  Respiratory: Normal respiratory effort, no problems with respiration noted  Abdomen: Soft, gravid, appropriate for gestational age. Pain/Pressure: Present     Pelvic:  Cervical exam deferred        Extremities: Normal range  of motion.  Edema: Other (Comment) (hands)  Mental Status: Normal mood and affect. Normal behavior. Normal judgment and thought content.   Assessment   31 y.o. G1P0000 at [redacted]w[redacted]d by  03/31/2022, by Last Menstrual Period presenting for routine prenatal visit  Plan   FIRST Problems (from 08/11/21 to present)     Problem Noted Resolved   Obesity in pregnancy, antepartum 01/29/2022 by Imagene Riches, CNM No   Supervision of other normal pregnancy, antepartum 08/11/2021 by Cleophas Dunker, CMA No   Overview Addendum 01/20/2022  3:10 PM by Landis Gandy, [place]     Nursing Staff Provider  Office Location  [facility] Dating   EDD set  [facility] Model [ ]  Traditional [ ]  Centering [ ]  Mom-Baby Dyad    Language   Anatomy US  [place] anatomy (gender reveal)  Flu Vaccine  01/20/2022 Genetic/Carrier Screen  NIPS:   xy per MaternT AFP:    Horizon:  TDaP Vaccine    Hgb A1C or  GTT Early A1C 5.8, 95 Third trimester   COVID Vaccine    LAB RESULTS   Rhogam   Blood Type     Baby Feeding Plan  Antibody    Contraception  Rubella    Circumcision  RPR     Pediatrician   HBsAg     Support Person  HCVAb neg  Prenatal Classes  HIV       BTL Consent  GBS  seen in urine at NOB- + (For PCN allergy, check sensitivities)   VBAC Consent  Pap        DME Rx [ ]  BP cuff [ ]  Weight Scale Waterbirth  [ ]  Class [ ]  Consent [ ]  CNM visit  PHQ9 & GAD7 [  ] new OB [  ]  28 weeks  [  ] 36 weeks Induction  [ ]  Orders Entered [ ] Foley Y/N             Preterm labor symptoms and general obstetric precautions including but not limited to vaginal bleeding, contractions, leaking of fluid and fetal movement were reviewed in detail with the patient. Please refer to After Visit Summary for other counseling recommendations.   Return in about 2 weeks (around 02/17/2022) for ROB.  , CNM  [facility]  02/03/22  4:25 PM

## 2022-02-17 ENCOUNTER — Ambulatory Visit (INDEPENDENT_AMBULATORY_CARE_PROVIDER_SITE_OTHER): Payer: 59 | Admitting: Obstetrics

## 2022-02-17 VITALS — BP 113/75 | HR 117 | Wt 202.0 lb

## 2022-02-17 DIAGNOSIS — E669 Obesity, unspecified: Secondary | ICD-10-CM

## 2022-02-17 DIAGNOSIS — R81 Glycosuria: Secondary | ICD-10-CM

## 2022-02-17 DIAGNOSIS — Z3A34 34 weeks gestation of pregnancy: Secondary | ICD-10-CM

## 2022-02-17 DIAGNOSIS — Z113 Encounter for screening for infections with a predominantly sexual mode of transmission: Secondary | ICD-10-CM

## 2022-02-17 DIAGNOSIS — O99213 Obesity complicating pregnancy, third trimester: Secondary | ICD-10-CM

## 2022-02-17 DIAGNOSIS — Z3480 Encounter for supervision of other normal pregnancy, unspecified trimester: Secondary | ICD-10-CM

## 2022-02-17 DIAGNOSIS — Z3403 Encounter for supervision of normal first pregnancy, third trimester: Secondary | ICD-10-CM

## 2022-02-17 LAB — POCT URINALYSIS DIPSTICK OB
Bilirubin, UA: NEGATIVE
Blood, UA: NEGATIVE
Ketones, UA: NEGATIVE
Leukocytes, UA: NEGATIVE
Nitrite, UA: NEGATIVE
POC,PROTEIN,UA: NEGATIVE
Spec Grav, UA: 1.015 (ref 1.010–1.025)
Urobilinogen, UA: 0.2 E.U./dL
pH, UA: 5 (ref 5.0–8.0)

## 2022-02-17 NOTE — Progress Notes (Signed)
Routine Prenatal Care Visit  Subjective  Carol Carter is a 31 y.o. G1P0000 at [redacted]w[redacted]d being seen today for ongoing prenatal care.  She is currently monitored for the following issues for this low-risk pregnancy and has Pelvic pain; Irregular intermenstrual bleeding; LGSIL on Pap smear of cervix; Dysmenorrhea; Left ovarian cyst; BV (bacterial vaginosis); Furuncle of left axilla; Supervision of other normal pregnancy, antepartum; and Obesity in pregnancy, antepartum on their problem list.  ----------------------------------------------------------------------------------- Patient reports no complaints.  She has days when her back bother her; some difficulty sleeping at night. Baby moves well. She wants to breastfeed. Contractions: Not present. Vag. Bleeding: None.  Movement: Present. Leaking Fluid denies.  ----------------------------------------------------------------------------------- The following portions of the patient's history were reviewed and updated as appropriate: allergies, current medications, past family history, past medical history, past social history, past surgical history and problem list. Problem list updated.  Objective  Blood pressure 113/75, pulse (!) 117, weight 202 lb (91.6 kg), last menstrual period 06/24/2021. Pregravid weight 175 lb (79.4 kg) Total Weight Gain 27 lb (12.2 kg) Urinalysis: Urine Protein    Urine Glucose    Fetal Status:     Movement: Present     General:  Alert, oriented and cooperative. Patient is in no acute distress.  Skin: Skin is warm and dry. No rash noted.   Cardiovascular: Normal heart rate noted  Respiratory: Normal respiratory effort, no problems with respiration noted  Abdomen: Soft, gravid, appropriate for gestational age. Pain/Pressure: Absent     Pelvic:  Cervical exam deferred        Extremities: Normal range of motion.     Mental Status: Normal mood and affect. Normal behavior. Normal judgment and thought content.   Assessment    31 y.o. G1P0000 at [redacted]w[redacted]d by  03/31/2022, by Last Menstrual Period presenting for routine prenatal visit  Plan   FIRST Problems (from 08/11/21 to present)     Problem Noted Resolved   Obesity in pregnancy, antepartum 01/29/2022 by Imagene Riches, CNM No   Supervision of other normal pregnancy, antepartum 08/11/2021 by Cleophas Dunker, CMA No   Overview Addendum 01/20/2022  3:10 PM by Landis Gandy, [place]     Nursing Staff Provider  Office Location  [facility] Dating   EDD set  [place] [ ]  Traditional [ ]  Centering [ ]  Mom-Baby Dyad    Language   Anatomy US  Normall anatomy (gender reveal)  Flu Vaccine  01/20/2022 Genetic/Carrier Screen  NIPS:   xy per MaternT AFP:    Horizon:  TDaP Vaccine    Hgb A1C or  GTT Early A1C 5.8, 95 Third trimester   COVID Vaccine    LAB RESULTS   Rhogam   Blood Type     Baby Feeding Plan  Antibody    Contraception  Rubella    Circumcision  RPR     Pediatrician   HBsAg     Support Person  HCVAb neg  Prenatal Classes  HIV       BTL Consent  GBS  seen in urine at NOB- + (For PCN allergy, check sensitivities)   VBAC Consent  Pap        DME Rx [ ]  BP cuff [ ]  Weight Scale Waterbirth  [ ]  Class [ ]  Consent [ ]  CNM visit  PHQ9 & GAD7 [  ] new OB [  ] 28 weeks  [  ] 36 weeks Induction  [ ]  Orders Entered [ ] Foley Y/N  Preterm labor symptoms and general obstetric precautions including but not limited to vaginal bleeding, contractions, leaking of fluid and fetal movement were reviewed in detail with the patient. Please refer to After Visit Summary for other counseling recommendations.   No follow-ups on file.  Mirna Mires, CNM  02/17/2022 2:28 PM

## 2022-02-18 ENCOUNTER — Encounter: Payer: Self-pay | Admitting: Obstetrics

## 2022-03-03 ENCOUNTER — Ambulatory Visit (INDEPENDENT_AMBULATORY_CARE_PROVIDER_SITE_OTHER): Payer: 59 | Admitting: Licensed Practical Nurse

## 2022-03-03 ENCOUNTER — Other Ambulatory Visit (HOSPITAL_COMMUNITY)
Admission: RE | Admit: 2022-03-03 | Discharge: 2022-03-03 | Disposition: A | Discharge status: Home / Self Care | Payer: 59 | Source: Ambulatory Visit | Attending: Licensed Practical Nurse | Admitting: Licensed Practical Nurse

## 2022-03-03 ENCOUNTER — Encounter: Payer: Self-pay | Admitting: Licensed Practical Nurse

## 2022-03-03 VITALS — BP 109/69 | HR 103 | Wt 204.4 lb

## 2022-03-03 DIAGNOSIS — Z113 Encounter for screening for infections with a predominantly sexual mode of transmission: Secondary | ICD-10-CM

## 2022-03-03 DIAGNOSIS — Z3480 Encounter for supervision of other normal pregnancy, unspecified trimester: Secondary | ICD-10-CM | POA: Diagnosis not present

## 2022-03-03 DIAGNOSIS — Z23 Encounter for immunization: Secondary | ICD-10-CM | POA: Diagnosis not present

## 2022-03-03 DIAGNOSIS — E669 Obesity, unspecified: Secondary | ICD-10-CM

## 2022-03-03 DIAGNOSIS — O99213 Obesity complicating pregnancy, third trimester: Secondary | ICD-10-CM

## 2022-03-03 DIAGNOSIS — Z3685 Encounter for antenatal screening for Streptococcus B: Secondary | ICD-10-CM

## 2022-03-03 DIAGNOSIS — Z3A36 36 weeks gestation of pregnancy: Secondary | ICD-10-CM

## 2022-03-03 DIAGNOSIS — O09293 Supervision of pregnancy with other poor reproductive or obstetric history, third trimester: Secondary | ICD-10-CM

## 2022-03-03 DIAGNOSIS — Z131 Encounter for screening for diabetes mellitus: Secondary | ICD-10-CM

## 2022-03-03 NOTE — Progress Notes (Signed)
Routine Prenatal Care Visit  Subjective  Carol Carter is a 31 y.o. G1P0000 at [redacted]w[redacted]d being seen today for ongoing prenatal care.  Carol Carter is currently monitored for the following issues for this low-risk pregnancy and has Pelvic pain; Irregular intermenstrual bleeding; LGSIL on Pap smear of cervix; Dysmenorrhea; Left ovarian cyst; BV (bacterial vaginosis); Furuncle of left axilla; Supervision of other normal pregnancy, antepartum; and Obesity in pregnancy, antepartum on their problem list.  ----------------------------------------------------------------------------------- Patient reports  sleep disturbances .  Mood is good. Has not found ped yet. Has carseat.  Glucose noted in urine at last visit, pt reports Carol Carter did eat a treat at work prior to that appointment.  -Plans to work until Carol Carter goes into labor.  Contractions: Irritability. Vag. Bleeding: None.  Movement: Present. Leaking Fluid denies.  ----------------------------------------------------------------------------------- The following portions of the patient's history were reviewed and updated as appropriate: allergies, current medications, past family history, past medical history, past social history, past surgical history and problem list. Problem list updated.  Objective  Blood pressure 109/69, pulse (!) 103, weight 204 lb 6.4 oz (92.7 kg), last menstrual period 06/24/2021. Pregravid weight 175 lb (79.4 kg) Total Weight Gain 29 lb 6.4 oz (13.3 kg) Urinalysis: Urine Protein    Urine Glucose    Fetal Status: Fetal Heart Rate (bpm): 130 Fundal Height: 36 cm Movement: Present     General:  Alert, oriented and cooperative. Patient is in no acute distress.  Skin: Skin is warm and dry. No rash noted.   Cardiovascular: Normal heart rate noted  Respiratory: Normal respiratory effort, no problems with respiration noted  Abdomen: Soft, gravid, appropriate for gestational age. Pain/Pressure: Present     Pelvic:  Cervical exam deferred         Extremities: Normal range of motion.     Mental Status: Normal mood and affect. Normal behavior. Normal judgment and thought content.   Assessment   31 y.o. G1P0000 at [redacted]w[redacted]d by  03/31/2022, by Last Menstrual Period presenting for routine prenatal visit  Plan   FIRST Problems (from 08/11/21 to present)     Problem Noted Resolved   Obesity in pregnancy, antepartum 01/29/2022 by Mirna Mires, CNM No   Supervision of other normal pregnancy, antepartum 08/11/2021 by Loran Senters, CMA No   Overview Addendum 03/03/2022  2:18 PM by Burtis Junes, CMA     Nursing Staff Provider  Office Location  [facility] Dating   EDD set  [facility] Model [ ]  Traditional [ ]  Centering [ ]  Mom-Baby Dyad    Language   Anatomy  Normall anatomy (gender reveal)  Flu Vaccine  01/20/2022 Genetic/Carrier Screen  NIPS:   xy per MaternT AFP:    Horizon:  TDaP Vaccine   03/03/22 Hgb A1C or  GTT Early A1C 5.8, 95 Third trimester   COVID Vaccine    LAB RESULTS   Rhogam   Blood Type A/Positive/-- (05/17 1521)   Baby Feeding Plan Breastfeeding Antibody Negative (05/17 1521)  Contraception IUD Rubella 1.03 (05/17 1521)  Circumcision yes RPR Non Reactive (10/05 1022)   Pediatrician   HBsAg Negative (05/17 1521)   Support Person Mom HCVAb neg  Prenatal Classes N/A HIV Non Reactive (10/05 1022)     BTL Consent  GBS  seen in urine at NOB- + (For PCN allergy, check sensitivities)   VBAC Consent  Pap        DME Rx [ ]  BP cuff [ ]  Weight Scale Waterbirth  [ ]  Class [ ]   Consent [ ]  CNM visit  PHQ9 & GAD7 [  ] new OB [  ] 28 weeks  [  ] 36 weeks Induction  [ ]  Orders Entered [ ] Foley Y/N             Preterm labor symptoms and general obstetric precautions including but not limited to vaginal bleeding, contractions, leaking of fluid and fetal movement were reviewed in detail with the patient. Please refer to After Visit Summary for other counseling recommendations.   Return in about 1 week (around 03/10/2022)  for ROB.  36 wks labs and Ha1c collected  TDAP given today   Annelle Behrendt, [facility]  03/04/22  8:05 PM

## 2022-03-04 DIAGNOSIS — Z419 Encounter for procedure for purposes other than remedying health state, unspecified: Secondary | ICD-10-CM | POA: Diagnosis not present

## 2022-03-04 LAB — HEMOGLOBIN A1C
Est. average glucose Bld gHb Est-mCnc: 128 mg/dL
Hgb A1c MFr Bld: 6.1 % — ABNORMAL HIGH (ref 4.8–5.6)

## 2022-03-07 LAB — CERVICOVAGINAL ANCILLARY ONLY
Chlamydia: NEGATIVE
Comment: NEGATIVE
Comment: NORMAL
Neisseria Gonorrhea: NEGATIVE

## 2022-03-07 LAB — CULTURE, BETA STREP (GROUP B ONLY): Strep Gp B Culture: NEGATIVE

## 2022-03-11 ENCOUNTER — Encounter: Payer: Self-pay | Admitting: Medical

## 2022-03-11 ENCOUNTER — Ambulatory Visit (INDEPENDENT_AMBULATORY_CARE_PROVIDER_SITE_OTHER): Payer: 59 | Admitting: Medical

## 2022-03-11 VITALS — BP 119/76 | HR 101 | Wt 208.0 lb

## 2022-03-11 DIAGNOSIS — O99213 Obesity complicating pregnancy, third trimester: Secondary | ICD-10-CM

## 2022-03-11 DIAGNOSIS — O26893 Other specified pregnancy related conditions, third trimester: Secondary | ICD-10-CM

## 2022-03-11 DIAGNOSIS — R8271 Bacteriuria: Secondary | ICD-10-CM

## 2022-03-11 DIAGNOSIS — O99820 Streptococcus B carrier state complicating pregnancy: Secondary | ICD-10-CM

## 2022-03-11 DIAGNOSIS — Z3480 Encounter for supervision of other normal pregnancy, unspecified trimester: Secondary | ICD-10-CM

## 2022-03-11 DIAGNOSIS — M5432 Sciatica, left side: Secondary | ICD-10-CM

## 2022-03-11 DIAGNOSIS — N949 Unspecified condition associated with female genital organs and menstrual cycle: Secondary | ICD-10-CM

## 2022-03-11 DIAGNOSIS — E669 Obesity, unspecified: Secondary | ICD-10-CM

## 2022-03-11 DIAGNOSIS — O99210 Obesity complicating pregnancy, unspecified trimester: Secondary | ICD-10-CM

## 2022-03-11 DIAGNOSIS — Z3A37 37 weeks gestation of pregnancy: Secondary | ICD-10-CM

## 2022-03-11 LAB — POCT URINALYSIS DIPSTICK OB
Bilirubin, UA: NEGATIVE
Blood, UA: NEGATIVE
Glucose, UA: NEGATIVE
Ketones, UA: NEGATIVE
Leukocytes, UA: NEGATIVE
Nitrite, UA: NEGATIVE
POC,PROTEIN,UA: NEGATIVE
Spec Grav, UA: <=1.005 — AB (ref 1.010–1.025)
Urobilinogen, UA: 0.2 E.U./dL
pH, UA: 6.5 (ref 5.0–8.0)

## 2022-03-11 NOTE — Patient Instructions (Signed)
AREA PEDIATRIC/FAMILY PRACTICE PHYSICIANS  Central/[place]outheast [place] ([address]) [facility] Chambliss, MD; Eniola, MD; Hale, MD; Hensel, MD; McDiarmid, MD; McIntyer, MD; Neal, MD; Walden, MD [address] [place], [place] [address] [phone] Mon-Fri 8:30-12:30, 1:30-5:00 Providers come to see babies at [facility] Accepting Medicaid [facility] providers who accept newborns: Koirala, MD; Morrow, MD; Wolters, MD [address], [place], [place] [address] [phone] Mon-Fri 8:00-5:30 Babies seen by providers at [facility] Does NOT accept Medicaid Please call early in hospitalization for appointment (limited availability)  [facility] Mulberry, MD [address] [place], [place] [address] [phone] Mon, Tue, Thur, Fri 8:30-5:00, Wed 10:00-7:00 (closed 1-2pm) Babies seen by [facility] providers Accepting Medicaid Rubin - Pediatrician Rubin, MD [address], [place], [place] [address] [phone] Mon-Fri 8:30-5:00, Sat 8:30-12:00 Provider comes to see babies at [facility] Accepting Medicaid Must have been referred from current patients or contacted office prior to delivery [facility] ([facility]) Brown, MD; Chandler, MD; Ettefagh, MD; Grant, MD; Lester, MD; McCormick, MD; McQueen, MD; Prose, MD; Simha, MD; Stanley, MD; Stryffeler, NP; Tebben, NP [address], [place], [place] [address] [phone] Mon, Tue, Thur, Fri 8:30-5:30, Wed 9:30-5:30, Sat 8:30-12:30 Babies seen by [facility] providers Accepting Medicaid Only accepting infants of first-time parents or siblings of current patients Hospital discharge coordinator will make follow-up appointment Jack Amos [address], [place], [place]  [address] [phone]   Fax - [phone] [facility] [address], [place], [place]  [address] Phone - [phone]   Fax - [phone] Shilpa Gosrani [address], [place], [place]  [address] [phone]  East/Northeast [place] ([address]) [facility] Bates, MD; Brassfield, MD; Cooper, Cox, MD; MD; Davis, MD; Dovico, MD; Ettefaugh, MD; Little, MD; Lowe, MD; Keiffer, MD; Melvin, MD; Sumner, MD; Williams, MD [address], [place], [place] [address] [phone] Mon-Fri 8:30-5:00 (extended evenings Mon-Thur as needed), Sat-Sun 10:00-1:00 Providers come to see babies at [facility] Accepting Medicaid for families of first-time babies and families with all children in the household age 3 and under. Must register with office prior to making appointment (M-F only). [facility] Henson, NP; Knapp, MD; Lalonde, MD; Tysinger, PA [address] [place], [place] [address] [phone] Mon-Fri 8:00-5:00 Babies seen by providers at [facility] Does NOT accept Medicaid/Commercial Insurance Only [facility] ([facility])  Artis, MD; Barnes, MD; Bratton, MD; Coccaro, MD; Lockett Gardner, MD; Kramer, MD; [place], MD; Netherton, MD; Poleto, MD; Skinner, MD [address] [place], [place] [address] [phone] Mon-Fri 8:30-5:30, Sat (Oct.-Mar.) 9:00-1:00 Babies seen by providers at [facility] Accepting Medicaid  [place] ([address]) [facility] Reid, MD; Warner, MD [address], [place],  [address] [phone] Mon-Fri 8:30-5:00, Sat 8:30-12:00 Providers come to see babies at [facility] Does NOT accept Medicaid [facility] Becker, PA; Hagler, MD; Scifres, PA; Sun, MD; Swayne, MD [address], [place],  [address] [phone] Mon-Fri 8:00-5:00 Babies seen by providers at [facility] Does NOT accept Medicaid Only accepting babies of parents who  are patients Please call early in hospitalization for appointment (limited availability) [facility] Clark, MD; Frye, MD; Kelleher, MD; Mack, NP; Miller, MD; O'Keller, MD; Patterson, NP; Pudlo, MD; Puzio, MD; Thomas, MD; Tucker, MD; Twiselton, MD [address], [place], [place] [address] [phone] Mon-Fri 8:00-5:00, Sat 9:00-12:00 Providers come to see babies at [facility] Does NOT accept Medicaid  [place] ([address]) [facility] providers accepting new patients: Brake, NP; Wharton, PA [address], [place], [place] [address] [phone] Mon-Fri 8:00-5:00 Babies seen by providers at [facility] Does NOT accept Medicaid Only accepting babies of parents who are patients Please call early in hospitalization for appointment (limited availability) [facility] Gay, MD; Quinlan, MD [address] [place], [place] [address] [phone] (press [phone] to schedule appointment) Mon-Fri 8:00-5:00 Providers come to see babies at [facility] Does NOT accept Medicaid [facility] Mazer, MD [address] [place], [place] [address] [phone] Mon-Fri 8:30-5:00 (lunch 12:30-1:00), extended hours by appointment only Wed 5:00-6:30 Babies seen by [facility] providers Accepting Medicaid [facility] Banks, MD; [place], MD; Koberlein, MD [address], [place], [place] [address] [phone] Mon-Fri 8:00-5:00 Babies seen by [facility] providers Does NOT accept Medicaid [facility] Parker, MD; Hunter, MD; Wallace, DO [address] [place], [place] [address] [phone] Mon-Fri 8:00-5:00 Babies seen by [facility] providers Does NOT accept Medicaid [facility] Brandon, PA; Brecken, PA; Christy, NP; Dees, MD; DeClaire, MD; DeWeese, MD; Hansen, NP; Mills, NP; Parrish, NP; Smoot, NP; Summer, MD; Vapne,  MD [address] [place], [place] [address] [phone] Mon-Fri 8:30-5:00, Sat 10:00-1:00 Providers come to see babies at [facility] Does NOT accept Medicaid Free prenatal information session Tuesdays at 4:45pm [facility] Bouska, MD; [place], PA; Jeffery, PA; Weber, PA [address] [place] [address] [phone] Mon-Fri 7:30-5:30 Babies seen by [facility] providers [facility] [address], [place], [place]  [address] [phone]   Fax - [phone]  [place] ([address] & [address]) [facility] [place] [address] [place], [place] [address] [phone] Mon-Thur 8:00-6:00 Providers come to see babies at [facility] Accepting Medicaid [facility] Anderson, NP; Badger, MD; Beal, PA; Spencer, PA [address] [place], [place] [address] [phone] Mon-Thur 7:30-7:30, Fri 7:30-4:30 Babies seen by [facility] providers Accepting Medicaid [facility] Agbuya, MD; Klett, NP; Romgoolam, MD [address], [place], [place] [address] [phone] Mon-Fri 8:30-5:00, Sat 8:30-12:00 Providers come to see babies at [facility] Accepting Medicaid Must have "Meet & Greet" appointment at office prior to delivery [facility] McCord, MD; Wallace, MD; Wood, MD [address], [place], [place] [address] [phone] Mon-Wed 8:00-6:00, Thur-Fri 8:00-5:00, Sat 9:00-12:00 Providers come to see babies at [facility] Does NOT accept Medicaid Only accepting siblings of current patients [facility]  [address], [place], [place]  [address] [phone]   Fax - [phone] [facility] [address], [place], [place]  [address] [phone]   Fax -  [phone]  [place] ([address] & [address]) [facility] Cirigliano, DO; Matthews, DO [address] , [place] [address] [phone] Mon-Fri 7:00-5:00 Babies seen by [facility] providers Does NOT accept Medicaid [facility] Briscoe, MD; Howley, PA; Moreira, PA [address], [place],  [address] [phone] Mon-Fri 8:00-5:00 Babies seen by [facility] providers Accepting Medicaid [facility] Boyd, MD; Church, PA; Jones, NP; Osborn, PA [address], ,  [address] [phone] Mon-Fri 8:00-5:00 Babies seen by providers at [facility] Accepting Medicaid  [place] ([address]) [facility] [place] Wendling, DO [address] [place], [place] [address] [phone] Mon-Fri 8:00-5:00 Babies seen by [facility] providers Does NOT accept Medicaid Limited availability, please call early in hospitalization to schedule follow-up [facility] Calderon, PA; Cummings, MD; Dillard, MD; Martin, PA; Olson, MD; VanDeven, PA [address] [address], [place], [place] [address] [phone] Mon-Fri 8:30-5:00, Sat 9:00-12:00 Babies seen by providers at [facility] Accepting Medicaid Please register online then schedule online or call office [email or web address] Hunter, NP; Kumar, MD; Martin Rogers, PA [address], [place], [place] [address] [phone] Mon-Fri 8:00-5:00 Babies seen by providers at [facility] Accepting Medicaid [facility] ([facility]) Laird, MD; Kristi Fleenor, NP; West, MD [address], [place], [place] [address] [phone] Mon-Fri 8:00-5:30, Sat&Sun by appointment (phones open at  8:30) Babies seen by [facility] providers Accepting Medicaid Must be a first-time baby or sibling of current patient [facility] [place]  [address], [place], [place]  [address] [phone]   Fax - [phone]  [place] ([address]) [facility] Brown, PA; Cowen, PA; Rice, MD; Helton, PA; Spry, MD [address] [place], [place] [address] [phone] Mon-Thur 8:00-7:00, Fri 8:00-5:00, Sat 8:00-12:00, Sun 9:00-12:00 Babies seen by [facility] providers Accepting Medicaid [facility] Coe-Goins, MD; [place], MD; Pierre-Louis, MD [address], [place], [place] [address] [phone] Mon-Thur 8:00-5:00 Babies seen by providers at [facility] Accepting Medicaid [facility] Bratton, MD; Coe-Goins, MD; Hayes, MD; Lewis, MD; List, MD; Lott, MD; [place], MD; Moran, MD; O'Neal, MD; Pierre-Louis, MD; Pitonzo, MD; Scholer, MD; Spangle, MD [address] [place], [place] [address] [phone] Mon-Fri 8:00-5:30, Sat (Oct.-Mar.) 9:00-1:00 Babies seen by providers at [facility] Accepting Medicaid Must fill out new patient packet, available online at [email or web address] [facility] ([facility]) Friddle, NP; Harris, NP; Kelly, NP; Logan, MD; Melvin, PA; Poth, MD; Ramadoss, MD; Stanton, NP [address], [place], [place] [address] [phone] Mon-Thur 8:00-5:30, Fri 8:00-5:00 Babies seen by providers at [facility] Accepting Medicaid  [place] ([address]) [facility] Dixon, PA; Bagtown, MD; Pickard, MD; Tapia, PA [address], [place], [place] [address] [phone] Mon-Fri 8:00-5:00 Babies seen by providers at [facility] Accepting Medicaid   [place] ([address]) [facility] [place] Masneri, DO; Meyers, MD; Nelson, PA [address], [place], [place] [address] [phone] Mon-Fri 8:00-5:00 Babies seen by providers at [facility] Does NOT accept Medicaid Limited appointment availability, please call early in hospitalization  [facility] [place] Kunedd, DO; McGowen, MD [address], [place], [place] [address] [phone] Mon-Fri 8:00-5:00 Babies seen by [facility] providers Does NOT accept Medicaid [facility] [place] Cameron, MD; MacDonald, MD; Michaels, PA; Nayak, MD [address] [place] [address], [place], [place] [address] [phone] Mon-Fri 8:00-5:00 After hours clinic ([address] [place],  [address]) [phone] Mon-Fri 5:00-8:00, Sat 12:00-6:00, Sun 10:00-4:00 Babies seen by [facility] providers Accepting Medicaid [facility] [place] [address], [place], [place]  [address] [phone]   Fax - [phone]  [place] ([address]) [facility] [place] [facility] Andy, MD [address], [place], [place] [address] [phone] Mon-Fri 8:00-5:00 Babies seen by [facility] providers Does NOT accept Medicaid [facility] [place] ([facility] [place]) Eksir, MD [address], [place], [place] [address] [phone] Mon-Thur 8:00-7:00, Fri 8:00-5:00, Sat 8:00-12:00 Babies seen by providers at [facility] Accepting Medicaid - but does not have vaccinations in office (must be received elsewhere) Limited availability, please call early in hospitalization  [place] ([address]) [facility]  Charlene Flemming, MD [address],  [place] [address] [phone]  Fax [phone]  [facility]  Kimberly Newton, MD, Annamarie Streilein, PA, Carla Hampton, PA [address] [place], [place]  [address] [phone] [facility]  [address], [place], [place] [address] [phone] [address], [place], [place] [address] [phone] (West Office)  [facility] [address], [place], [place] [address] [phone] [facility] [address], [place], [place] [address] [phone] [facility] [address], [place], [place] [address] [phone] [facility] [address], [place], [place] [address] [phone] [facility] [address], [place], [place] [address] [phone] [facility] [address], [place], [place] [address] [phone] [facility]  [address], [place], [place] [address] [phone] Dr[facility] [address], [place], [place] [address] [phone] [facility] [address], [place], [place] [address] [phone] [facility] [address], [place],  [address] [phone]  

## 2022-03-11 NOTE — Progress Notes (Signed)
   PRENATAL VISIT NOTE  Subjective:  Carol Carter is a 31 y.o. G1P0000 at [redacted]w[redacted]d being seen today for ongoing prenatal care.  She is currently monitored for the following issues for this low-risk pregnancy and has Pelvic pain; Irregular intermenstrual bleeding; LGSIL on Pap smear of cervix; Dysmenorrhea; Left ovarian cyst; BV (bacterial vaginosis); Furuncle of left axilla; Supervision of other normal pregnancy, antepartum; and Obesity in pregnancy, antepartum on their problem list.  Patient reports backache.  Contractions: Not present. Vag. Bleeding: None.  Movement: Present. Denies leaking of fluid.   The following portions of the patient's history were reviewed and updated as appropriate: allergies, current medications, past family history, past medical history, past social history, past surgical history and problem list.   Objective:   Vitals:   03/11/22 1356  BP: 119/76  Pulse: (!) 101  Weight: 208 lb (94.3 kg)    Fetal Status: Fetal Heart Rate (bpm): 140 Fundal Height: 36 cm Movement: Present  Presentation: Vertex  General:  Alert, oriented and cooperative. Patient is in no acute distress.  Skin: Skin is warm and dry. No rash noted.   Cardiovascular: Normal heart rate noted  Respiratory: Normal respiratory effort, no problems with respiration noted  Abdomen: Soft, gravid, appropriate for gestational age.  Pain/Pressure: Present     Pelvic: Cervical exam performed in the presence of a chaperone Dilation: Closed Effacement (%): 20 Station: Ballotable  Extremities: Normal range of motion.  Edema: None  Mental Status: Normal mood and affect. Normal behavior. Normal judgment and thought content.   Assessment and Plan:  Pregnancy: G1P0000 at [redacted]w[redacted]d 1. Supervision of other normal pregnancy, antepartum - Peds list given and importance of choosing Peds prior to delivery discussed  - POC Urinalysis Dipstick OB  2. Obesity in pregnancy, antepartum  3. [redacted] weeks gestation of  pregnancy  4. Group B strep bacteruria - Treat in labor    5. Round ligament pain   6. Sciatic nerve pain  - Discussed maternity belt, Tylenol PRN, heat and hydrotherapy for pain relief.   Term labor symptoms and general obstetric precautions including but not limited to vaginal bleeding, contractions, leaking of fluid and fetal movement were reviewed in detail with the patient. Please refer to After Visit Summary for other counseling recommendations.   Return in about 1 week (around 03/18/2022) for LOB, any provider.  Future Appointments  Date Time Provider Department Center  03/18/2022  3:30 PM Hildred Laser, MD [facility] None  03/24/2022  3:55 PM Dominic, Courtney Heys, CNM [facility] None    Vonzella Nipple, PA-C

## 2022-03-18 ENCOUNTER — Ambulatory Visit (INDEPENDENT_AMBULATORY_CARE_PROVIDER_SITE_OTHER): Payer: 59 | Admitting: Obstetrics and Gynecology

## 2022-03-18 ENCOUNTER — Encounter: Payer: Self-pay | Admitting: Obstetrics and Gynecology

## 2022-03-18 VITALS — BP 105/79 | HR 122 | Wt 211.8 lb

## 2022-03-18 DIAGNOSIS — Z3480 Encounter for supervision of other normal pregnancy, unspecified trimester: Secondary | ICD-10-CM

## 2022-03-18 DIAGNOSIS — Z3A38 38 weeks gestation of pregnancy: Secondary | ICD-10-CM

## 2022-03-18 DIAGNOSIS — R8271 Bacteriuria: Secondary | ICD-10-CM

## 2022-03-18 DIAGNOSIS — R000 Tachycardia, unspecified: Secondary | ICD-10-CM

## 2022-03-18 LAB — POCT URINALYSIS DIPSTICK OB
Bilirubin, UA: NEGATIVE
Blood, UA: NEGATIVE
Glucose, UA: NEGATIVE
Ketones, UA: NEGATIVE
Leukocytes, UA: NEGATIVE
Nitrite, UA: NEGATIVE
POC,PROTEIN,UA: NEGATIVE
Urobilinogen, UA: 0.2 E.U./dL
pH, UA: 7.5 (ref 5.0–8.0)

## 2022-03-18 NOTE — Progress Notes (Signed)
ROB 38.1w: She feels very tired. She has no new concerns. Reports good fetal movement.

## 2022-03-18 NOTE — Patient Instructions (Addendum)
Natural Cervical Ripening Supplements  Red Raspberry Leaf Tea 2-4 cups daily.  Start at [redacted] weeks gestation and continue until labor.     Evening Primrose Oil (1000 mg) Take twice daily - by mouth in the morning and vaginally at bedtime. (If previous C-section, take both doses by mouth) Start at 38 weeks and continue until labor    Blue Cohosh 1 capsule daily starting at [redacted] weeks gestation 2 capsules daily starting at [redacted] weeks gestation 3 capsules daily starting at [redacted] weeks gestation           OTHER MEDICATIONS FOR CERVICAL RIPENING           OTHER MEDICATIONS FOR CERVICAL RIPENING

## 2022-03-18 NOTE — Progress Notes (Signed)
ROB: Patient is a 31 y.o. G1P0000 female at 58w1.  Doing well overall, just feeling tired.  Discussed natural cervical ripening supplements. Patient has questions about membrane sweeping, answered questions.  Tachycardia noted over past several visits. Patient asymptomatic. Continue to monitor. RTC in 1 week.

## 2022-03-24 ENCOUNTER — Encounter: Payer: Self-pay | Admitting: Licensed Practical Nurse

## 2022-03-24 ENCOUNTER — Ambulatory Visit (INDEPENDENT_AMBULATORY_CARE_PROVIDER_SITE_OTHER): Payer: 59 | Admitting: Licensed Practical Nurse

## 2022-03-24 VITALS — BP 133/83 | HR 107 | Wt 212.8 lb

## 2022-03-24 DIAGNOSIS — Z3A39 39 weeks gestation of pregnancy: Secondary | ICD-10-CM

## 2022-03-24 DIAGNOSIS — Z3403 Encounter for supervision of normal first pregnancy, third trimester: Secondary | ICD-10-CM

## 2022-03-24 LAB — POCT URINALYSIS DIPSTICK
Bilirubin, UA: NEGATIVE
Blood, UA: NEGATIVE
Glucose, UA: NEGATIVE
Ketones, UA: NEGATIVE
Leukocytes, UA: NEGATIVE
Nitrite, UA: NEGATIVE
Protein, UA: NEGATIVE
Spec Grav, UA: 1.015 (ref 1.010–1.025)
Urobilinogen, UA: 0.2 E.U./dL
pH, UA: 6 (ref 5.0–8.0)

## 2022-03-24 NOTE — Progress Notes (Signed)
Routine Prenatal Care Visit  Subjective  Carol Carter is a 31 y.o. G1P0000 at [redacted]w[redacted]d being seen today for ongoing prenatal care.  She is currently monitored for the following issues for this low-risk pregnancy and has Pelvic pain; Irregular intermenstrual bleeding; LGSIL on Pap smear of cervix; Dysmenorrhea; Left ovarian cyst; Furuncle of left axilla; Supervision of other normal pregnancy, antepartum; Obesity in pregnancy, antepartum; and GBS bacteriuria on their problem list.  ----------------------------------------------------------------------------------- Patient reports  back pain, feels miserable, not sleeping well  .  Has been using Evening primrose oil and using breast pump. Desires IOL when able, understands rec to induce at 41 wks   Contractions: Irritability. Vag. Bleeding: None.  Movement: Present. Leaking Fluid denies.  ----------------------------------------------------------------------------------- The following portions of the patient's history were reviewed and updated as appropriate: allergies, current medications, past family history, past medical history, past social history, past surgical history and problem list. Problem list updated.  Objective  Blood pressure 133/83, pulse (!) 107, weight 212 lb 12.8 oz (96.5 kg), last menstrual period 06/24/2021. Pregravid weight 175 lb (79.4 kg) Total Weight Gain 37 lb 12.8 oz (17.1 kg) Urinalysis: Urine Protein    Urine Glucose    Fetal Status: Fetal Heart Rate (bpm): 140 Fundal Height: 41 cm Movement: Present     General:  Alert, oriented and cooperative. Patient is in no acute distress.  Skin: Skin is warm and dry. No rash noted.   Cardiovascular: Normal heart rate noted  Respiratory: Normal respiratory effort, no problems with respiration noted  Abdomen: Soft, gravid, appropriate for gestational age. Pain/Pressure: Present     Pelvic:  Cervical exam performed Dilation: Closed Effacement (%): 30 Station: Ballotable   Extremities: Normal range of motion.  Edema: None  Mental Status: Normal mood and affect. Normal behavior. Normal judgment and thought content.   Assessment   31 y.o. G1P0000 at [redacted]w[redacted]d by  03/31/2022, by Last Menstrual Period presenting for routine prenatal visit  Plan   FIRST Problems (from 08/11/21 to present)     Problem Noted Resolved   Obesity in pregnancy, antepartum 01/29/2022 by Mirna Mires, CNM No   Supervision of other normal pregnancy, antepartum 08/11/2021 by Loran Senters, CMA No   Overview Addendum 03/11/2022 11:20 AM by Marny Lowenstein, PA-C     Nursing Staff Provider  Office Location  AOB Dating   EDD set  [facility] Model [x]  Traditional [ ]  Centering [ ]  Mom-Baby Dyad    Language  English Anatomy  [place] anatomy (gender reveal)  Flu Vaccine  01/20/2022 Genetic/Carrier Screen  NIPS:   xy per MaternT AFP:    Horizon:  TDaP Vaccine   03/03/22 Hgb A1C or  GTT Early A1C 5.8, 95 Third trimester 95; passed 3 hour  COVID Vaccine    LAB RESULTS   Rhogam  NA Blood Type A/Positive/-- (05/17 1521)   Baby Feeding Plan Breastfeeding Antibody Negative (05/17 1521)  Contraception IUD Rubella 1.03 (05/17 1521)  Circumcision yes RPR Non Reactive (10/05 1022)   Pediatrician  List given  HBsAg Negative (05/17 1521)   Support Person Mom HCVAb neg  Prenatal Classes N/A HIV Non Reactive (10/05 1022)     BTL Consent NA GBS  seen in urine at NOB- +  VBAC Consent NA Pap 08/2020 - negative        DME Rx [ ]  BP cuff [ ]  Weight Scale Waterbirth  [ ]  Class [ ]  Consent [ ]  CNM visit  PHQ9 & GAD7 [  ]  new OB [  ] 28 weeks  [  ] 36 weeks Induction  [ ]  Orders Entered [ ] Foley Y/N             Term labor symptoms and general obstetric precautions including but not limited to vaginal bleeding, contractions, leaking of fluid and fetal movement were reviewed in detail with the patient. Please refer to After Visit Summary for other counseling recommendations.   Return in about 1  week (around 03/31/2022) for ROB, NST.  Order for AFI placed Schedule IOL at next visit     [facility]  03/24/22  4:16 PM

## 2022-03-29 ENCOUNTER — Other Ambulatory Visit: Payer: 59

## 2022-03-29 DIAGNOSIS — O480 Post-term pregnancy: Secondary | ICD-10-CM | POA: Insufficient documentation

## 2022-03-29 DIAGNOSIS — Z3403 Encounter for supervision of normal first pregnancy, third trimester: Secondary | ICD-10-CM | POA: Insufficient documentation

## 2022-03-29 NOTE — Patient Instructions (Signed)

## 2022-03-29 NOTE — Progress Notes (Unsigned)
    NURSE VISIT NOTE  Subjective:    Patient ID: Carol Carter, female    DOB: 01-17-1991, 31 y.o.   MRN: [id number]  HPI  Patient is a 31 y.o. G49P0000 female who presents for fetal monitoring per order from Carie Caddy, [place].   Objective:    LMP 06/24/2021 (Exact Date)  Estimated Date of Delivery: 03/31/22  Assessment:   1. Encounter for supervision of normal first pregnancy in third trimester   2. Post-term pregnancy, 40-42 weeks of gestation      Plan:   Results reviewed and discussed with patient by  Brennan Bailey, MD.     Rocco Serene, LPN

## 2022-03-30 ENCOUNTER — Other Ambulatory Visit: Payer: Self-pay

## 2022-03-30 ENCOUNTER — Ambulatory Visit (INDEPENDENT_AMBULATORY_CARE_PROVIDER_SITE_OTHER): Payer: 59

## 2022-03-30 ENCOUNTER — Ambulatory Visit (INDEPENDENT_AMBULATORY_CARE_PROVIDER_SITE_OTHER): Payer: 59 | Admitting: Obstetrics and Gynecology

## 2022-03-30 ENCOUNTER — Inpatient Hospital Stay
Admission: EM | Admit: 2022-03-30 | Discharge: 2022-04-03 | DRG: 788 | Disposition: A | Discharge status: Home / Self Care | Payer: 59 | Attending: Advanced Practice Midwife | Admitting: Advanced Practice Midwife

## 2022-03-30 ENCOUNTER — Other Ambulatory Visit: Payer: Self-pay | Admitting: Licensed Practical Nurse

## 2022-03-30 ENCOUNTER — Encounter: Payer: Self-pay | Admitting: Obstetrics and Gynecology

## 2022-03-30 ENCOUNTER — Ambulatory Visit
Admission: RE | Admit: 2022-03-30 | Discharge: 2022-03-30 | Disposition: A | Discharge status: Home / Self Care | Payer: 59 | Source: Ambulatory Visit | Attending: Licensed Practical Nurse | Admitting: Licensed Practical Nurse

## 2022-03-30 VITALS — BP 122/84 | HR 110 | Wt 207.0 lb

## 2022-03-30 VITALS — BP 122/84 | HR 110 | Ht 63.0 in | Wt 207.0 lb

## 2022-03-30 DIAGNOSIS — O99210 Obesity complicating pregnancy, unspecified trimester: Secondary | ICD-10-CM | POA: Diagnosis present

## 2022-03-30 DIAGNOSIS — Z3A40 40 weeks gestation of pregnancy: Secondary | ICD-10-CM | POA: Diagnosis not present

## 2022-03-30 DIAGNOSIS — O99214 Obesity complicating childbirth: Secondary | ICD-10-CM | POA: Diagnosis present

## 2022-03-30 DIAGNOSIS — Z87891 Personal history of nicotine dependence: Secondary | ICD-10-CM | POA: Diagnosis not present

## 2022-03-30 DIAGNOSIS — Z3490 Encounter for supervision of normal pregnancy, unspecified, unspecified trimester: Secondary | ICD-10-CM

## 2022-03-30 DIAGNOSIS — Z3A39 39 weeks gestation of pregnancy: Secondary | ICD-10-CM

## 2022-03-30 DIAGNOSIS — O99824 Streptococcus B carrier state complicating childbirth: Secondary | ICD-10-CM | POA: Diagnosis present

## 2022-03-30 DIAGNOSIS — Z3403 Encounter for supervision of normal first pregnancy, third trimester: Secondary | ICD-10-CM

## 2022-03-30 DIAGNOSIS — Z87442 Personal history of urinary calculi: Secondary | ICD-10-CM | POA: Diagnosis not present

## 2022-03-30 DIAGNOSIS — Z3689 Encounter for other specified antenatal screening: Secondary | ICD-10-CM | POA: Insufficient documentation

## 2022-03-30 DIAGNOSIS — O99820 Streptococcus B carrier state complicating pregnancy: Secondary | ICD-10-CM | POA: Diagnosis not present

## 2022-03-30 DIAGNOSIS — O4103X0 Oligohydramnios, third trimester, not applicable or unspecified: Secondary | ICD-10-CM | POA: Diagnosis present

## 2022-03-30 DIAGNOSIS — Z3480 Encounter for supervision of other normal pregnancy, unspecified trimester: Secondary | ICD-10-CM

## 2022-03-30 DIAGNOSIS — O480 Post-term pregnancy: Secondary | ICD-10-CM | POA: Insufficient documentation

## 2022-03-30 DIAGNOSIS — R8271 Bacteriuria: Secondary | ICD-10-CM | POA: Diagnosis present

## 2022-03-30 DIAGNOSIS — E669 Obesity, unspecified: Secondary | ICD-10-CM | POA: Diagnosis not present

## 2022-03-30 DIAGNOSIS — O4100X0 Oligohydramnios, unspecified trimester, not applicable or unspecified: Secondary | ICD-10-CM | POA: Diagnosis present

## 2022-03-30 LAB — TYPE AND SCREEN
ABO/RH(D): A POS
Antibody Screen: NEGATIVE

## 2022-03-30 LAB — CBC
HCT: 31.6 % — ABNORMAL LOW (ref 36.0–46.0)
Hemoglobin: 10.6 g/dL — ABNORMAL LOW (ref 12.0–15.0)
MCH: 28 pg (ref 26.0–34.0)
MCHC: 33.5 g/dL (ref 30.0–36.0)
MCV: 83.4 fL (ref 80.0–100.0)
Platelets: 390 10*3/uL (ref 150–400)
RBC: 3.79 MIL/uL — ABNORMAL LOW (ref 3.87–5.11)
RDW: 13.5 % (ref 11.5–15.5)
WBC: 7.4 10*3/uL (ref 4.0–10.5)
nRBC: 0 % (ref 0.0–0.2)

## 2022-03-30 LAB — POCT URINALYSIS DIPSTICK OB
Appearance: NORMAL
Bilirubin, UA: NEGATIVE
Blood, UA: NEGATIVE
Glucose, UA: NEGATIVE
Ketones, UA: NEGATIVE
Leukocytes, UA: NEGATIVE
Nitrite, UA: NEGATIVE
Odor: NORMAL
POC,PROTEIN,UA: NEGATIVE
Spec Grav, UA: 1.01 (ref 1.010–1.025)
Urobilinogen, UA: 0.2 E.U./dL
pH, UA: 5 (ref 5.0–8.0)

## 2022-03-30 MED ORDER — OXYTOCIN-SODIUM CHLORIDE 30-0.9 UT/500ML-% IV SOLN
1.0000 m[IU]/min | INTRAVENOUS | Status: DC
  Administered 2022-03-31: 8 m[IU]/min via INTRAVENOUS
  Administered 2022-03-31: 1 m[IU]/min via INTRAVENOUS
  Administered 2022-04-01: 2 m[IU]/min via INTRAVENOUS
  Filled 2022-03-30: qty 500

## 2022-03-30 MED ORDER — OXYTOCIN BOLUS FROM INFUSION: 333.0000 mL | Freq: Once | INTRAVENOUS | Status: DC

## 2022-03-30 MED ORDER — MISOPROSTOL 25 MCG QUARTER TABLET
25.0000 ug | ORAL_TABLET | Freq: Once | ORAL | Status: DC
  Filled 2022-03-30: qty 1

## 2022-03-30 MED ORDER — OXYTOCIN-SODIUM CHLORIDE 30-0.9 UT/500ML-% IV SOLN
2.5000 [IU]/h | INTRAVENOUS | Status: DC
  Administered 2022-04-01: 25 [IU]/h via INTRAVENOUS

## 2022-03-30 MED ORDER — SOD CITRATE-CITRIC ACID 500-334 MG/5ML PO SOLN: 30.0000 mL | ORAL | Status: DC | PRN

## 2022-03-30 MED ORDER — LIDOCAINE HCL (PF) 1 % IJ SOLN
30.0000 mL | INTRAMUSCULAR | Status: DC | PRN
  Filled 2022-03-30: qty 30

## 2022-03-30 MED ORDER — LACTATED RINGERS IV SOLN
500.0000 mL | INTRAVENOUS | Status: DC | PRN
  Administered 2022-03-30 – 2022-04-01 (×4): 500 mL via INTRAVENOUS

## 2022-03-30 MED ORDER — ONDANSETRON HCL 4 MG/2ML IJ SOLN
4.0000 mg | Freq: Four times a day (QID) | INTRAMUSCULAR | Status: DC | PRN
  Administered 2022-04-01: 4 mg via INTRAVENOUS
  Filled 2022-03-30: qty 2

## 2022-03-30 MED ORDER — LACTATED RINGERS IV SOLN: INTRAVENOUS | Status: DC

## 2022-03-30 MED ORDER — DOXYLAMINE SUCCINATE (SLEEP) 25 MG PO TABS
25.0000 mg | ORAL_TABLET | Freq: Every evening | ORAL | Status: DC | PRN
  Administered 2022-03-31: 25 mg via ORAL
  Filled 2022-03-30: qty 1

## 2022-03-30 MED ORDER — MISOPROSTOL 50MCG HALF TABLET
50.0000 ug | ORAL_TABLET | Freq: Once | ORAL | Status: AC
  Administered 2022-03-30: 50 ug via VAGINAL
  Filled 2022-03-30: qty 1

## 2022-03-30 MED ORDER — PENICILLIN G POT IN DEXTROSE 60000 UNIT/ML IV SOLN
3.0000 10*6.[IU] | INTRAVENOUS | Status: DC
  Administered 2022-03-31 – 2022-04-01 (×4): 3 10*6.[IU] via INTRAVENOUS
  Filled 2022-03-30 (×4): qty 50

## 2022-03-30 MED ORDER — FENTANYL CITRATE (PF) 100 MCG/2ML IJ SOLN
50.0000 ug | INTRAMUSCULAR | Status: DC | PRN
  Administered 2022-04-01: 50 ug via INTRAVENOUS
  Administered 2022-04-01: 100 ug via INTRAVENOUS
  Filled 2022-03-30 (×2): qty 2

## 2022-03-30 MED ORDER — TERBUTALINE SULFATE 1 MG/ML IJ SOLN: 0.2500 mg | Freq: Once | INTRAMUSCULAR | Status: DC | PRN

## 2022-03-30 MED ORDER — MISOPROSTOL 25 MCG QUARTER TABLET
25.0000 ug | ORAL_TABLET | ORAL | Status: DC | PRN
  Administered 2022-03-31 (×2): 25 ug via VAGINAL
  Filled 2022-03-30: qty 1

## 2022-03-30 MED ORDER — SODIUM CHLORIDE 0.9 % IV SOLN
5.0000 10*6.[IU] | Freq: Once | INTRAVENOUS | Status: AC
  Administered 2022-03-31: 5 10*6.[IU] via INTRAVENOUS
  Filled 2022-03-30: qty 5

## 2022-03-30 NOTE — Progress Notes (Signed)
ROB [redacted]w[redacted]d: Doing well. Patient states she is having irregular contractions with low back pain and good fetal movement. NST today. AFI scheduled today @2 :30 [facility].

## 2022-03-30 NOTE — H&P (Signed)
OB History & Physical   History of Present Illness:  Chief Complaint:   HPI:  Carol Carter is a 31 y.o. G1P0000 female at [redacted]w[redacted]d dated by L and 13wkUS .  She presents to L&D for induction of labor secondary to oligohydramnios. She was seen today for a ROB, had a non reactive NST then had a 6/8 BPP, the AFI was found to be 2.9. It was then recommended to move forward with a an induction of labor.  She denies contractions, LOF or VB. She endorses FM but states the fetus is a little quite today.  She does have nasal congestion, which she has had for the last few weeks.  Mea has had early and regular prenatal care. Her pregnancy has  been complicated by the following:  Pregnancy Issues: 1. BMI 36.67 2. Oligohydramnios 3. GBS in urine 4. Hx of asthma   Maternal Medical History:   Past Medical History:  Diagnosis Date   Asthma    teenager   Dysmenorrhea    History of kidney stones    Left ovarian cyst 08/2019   Sleep apnea 2017   does not wear cpap    Past Surgical History:  Procedure Laterality Date   CYSTOSCOPY  08/08/2019   LAPAROSCOPIC OVARIAN CYSTECTOMY Left 08/15/2019   Procedure: LAPAROSCOPIC OVARIAN CYSTECTOMY;  Surgeon: Nadara Mustard, MD;  Location: [facility] ORS;  Service: Gynecology;  Laterality: Left;   WISDOM TOOTH EXTRACTION  07/17/2016    No Known Allergies  Prior to Admission medications   Medication Sig Start Date End Date Taking? Authorizing Provider  Prenatal Vit-Fe Fumarate-FA (MULTIVITAMIN-PRENATAL) 27-0.8 MG TABS tablet Take 1 tablet by mouth daily at 12 noon.    [provider]     Prenatal care site: [facility]   Social History: She  reports that she quit smoking about 8 months ago. Her smoking use included cigarettes. She smoked an average of .1 packs per day. She has never used smokeless tobacco. She reports that she does not currently use alcohol. She reports that she does not currently use drugs after having used the following  drugs: Marijuana.  Family History: family history includes Colon cancer (age of onset: 84) in her maternal aunt; Diabetes in her father; Heart Problems in her father; Hypertension in her brother and father; Liver disease in her brother; Thyroid disease in her mother.   Review of Systems: A full review of systems was performed and negative except as noted in the HPI.     Physical Exam:  Vital Signs: LMP 06/24/2021 (Exact Date)  General: no acute distress.  HEENT: normocephalic, atraumatic Heart: regular rate & rhythm.  No murmurs/rubs/gallops Lungs: clear to auscultation bilaterally, normal respiratory effort Abdomen: soft, gravid, non-tender;  EFW: 7.5lbs  Pelvic:   External: Normal external female genitalia  Cervix: Dilation: Closed / Effacement (%): 30 / Station: -3    Extremities: non-tender, symmetric, no edema bilaterally.  Neurologic: Alert & oriented x 3.    Results for orders placed or performed in visit on 03/30/22 (from the past 24 hour(s))  POC Urinalysis Dipstick OB     Status: Normal   Collection Time: 03/30/22  9:01 AM  Result Value Ref Range   Color, UA yellow    Clarity, UA clear    Glucose, UA Negative Negative   Bilirubin, UA Negative    Ketones, UA Negative    Spec Grav, UA 1.010 1.010 - 1.025   Blood, UA Negative    pH, UA  5.0 5.0 - 8.0   POC,PROTEIN,UA Negative Negative, Trace, Small (1+), Moderate (2+), Large (3+), 4+   Urobilinogen, UA 0.2 0.2 or 1.0 E.U./dL   Nitrite, UA Negative    Leukocytes, UA Negative Negative   Appearance normal    Odor normal     Pertinent Results:  Prenatal Labs: Blood type/Rh A positive   Antibody screen neg  Rubella Immune  Varicella Unknown   RPR NR  HBsAg Neg  HIV NR  GC neg  Chlamydia neg  Genetic screening negative  1 hour GTT 163  3 hour GTT 63, 155, 142, 77  GBS POS in urine    MWU:XLKGMWNU 155, moderate variability, pos accel, variable present  TOCO:q 4-5, soft resting tone  SVE:  Dilation: Closed /  Effacement (%): 30 / Station: -3    Cephalic by leopolds  US OB Limited  Result Date: 03/30/2022 CLINICAL DATA:  Evaluate AFI and fetal age EXAM: LIMITED OBSTETRIC ULTRASOUND AND BIOPHYSICAL PROFILE FINDINGS: Number of Fetuses: 1 Heart Rate:  133 bpm Movement: Seen Presentation: Cephalic Previa: No Placental Location: Anterior Amniotic Fluid (Subjective): Decreased AFI 2.9 cm BPD:  9.02cm 36w 4d Maternal Findings: Cervix:  Closed measuring 3.8 cm Uterus/Adnexae: No abnormality visualized. BIOPHYSICAL PROFILE Movement: 2 time: Breathing: Tone:  2 2 Amniotic Fluid: 0 Total Score:  6 IMPRESSION: Single live intrauterine pregnancy seen. Sonographically estimated gestational age is 36 weeks 4 days. There is 3 weeks discrepancy in the estimated gestational age in comparison with the study done on 01/20/2022. Biophysical profile score is 6 out of 8. AFI: 2.9 cm. Electronically Signed   By: Ernie Avena M.D.   On: 03/30/2022 16:14   US Fetal BPP W/O Non Stress  Result Date: 03/30/2022 CLINICAL DATA:  Evaluate AFI and fetal age EXAM: LIMITED OBSTETRIC ULTRASOUND AND BIOPHYSICAL PROFILE FINDINGS: Number of Fetuses: 1 Heart Rate:  133 bpm Movement: Seen Presentation: Cephalic Previa: No Placental Location: Anterior Amniotic Fluid (Subjective): Decreased AFI 2.9 cm BPD:  9.02cm 36w 4d Maternal Findings: Cervix:  Closed measuring 3.8 cm Uterus/Adnexae: No abnormality visualized. BIOPHYSICAL PROFILE Movement: 2 time: Breathing: Tone:  2 2 Amniotic Fluid: 0 Total Score:  6 IMPRESSION: Single live intrauterine pregnancy seen. Sonographically estimated gestational age is 36 weeks 4 days. There is 3 weeks discrepancy in the estimated gestational age in comparison with the study done on 01/20/2022. Biophysical profile score is 6 out of 8. AFI: 2.9 cm. Electronically Signed   By: Ernie Avena M.D.   On: 03/30/2022 16:14    Assessment:  Carol Carter is a 31 y.o. G1P0000 female at [redacted]w[redacted]d with  oligohydramnios.   Plan:  Admit to Labor & Delivery Cytotec placed at 1945, will place FB when able  CBC, T&S, Regular diet, IVF GBS  pos, PCN ordered  Consents obtained. Continuous efm/toco Aware of pain management options, will ask if desired   ----- Carie Caddy, CNM  [facility]

## 2022-03-30 NOTE — Progress Notes (Signed)
Pt seen for Rob had NRNST, BPP 6/8 for AFI 2.9 Reviewed findings with Dr Valentino Saxon, recommend IOL today or early tomorrow.  Call to Carol Carter, reviewed recommendation. Agreeable to plan. Will go home to gather her things and support. Will return to [facility] around 7pm for IOL.  Charge RN aware Orders placed Jannifer Hick  [facility]  03/30/22  4:20 PM

## 2022-03-30 NOTE — Progress Notes (Signed)
ROB: Reports fetal movement.  Denies contractions.  NST nonreactive-1 counting accel.  She is scheduled for AFI this afternoon so we will convert that to BPP.  Induction scheduled for January 3 at midnight.  Discussed in detail with patient.

## 2022-03-31 ENCOUNTER — Encounter: Payer: Self-pay | Admitting: Obstetrics and Gynecology

## 2022-03-31 DIAGNOSIS — Z3490 Encounter for supervision of normal pregnancy, unspecified, unspecified trimester: Secondary | ICD-10-CM | POA: Diagnosis present

## 2022-03-31 LAB — RPR: RPR Ser Ql: NONREACTIVE

## 2022-03-31 MED ORDER — SALINE SPRAY 0.65 % NA SOLN
1.0000 | NASAL | Status: DC | PRN
  Administered 2022-03-31: 1 via NASAL
  Filled 2022-03-31: qty 44

## 2022-03-31 MED ORDER — MISOPROSTOL 50MCG HALF TABLET
50.0000 ug | ORAL_TABLET | ORAL | Status: DC | PRN
  Administered 2022-03-31 (×2): 50 ug via VAGINAL
  Filled 2022-03-31 (×2): qty 1

## 2022-03-31 MED ORDER — AMMONIA AROMATIC IN INHA
RESPIRATORY_TRACT | Status: AC
  Filled 2022-03-31: qty 10

## 2022-03-31 MED ORDER — MISOPROSTOL 200 MCG PO TABS
ORAL_TABLET | ORAL | Status: AC
  Filled 2022-03-31: qty 4

## 2022-03-31 MED ORDER — OXYTOCIN 10 UNIT/ML IJ SOLN
INTRAMUSCULAR | Status: AC
  Filled 2022-03-31: qty 2

## 2022-03-31 MED ORDER — ACETAMINOPHEN 325 MG PO TABS
650.0000 mg | ORAL_TABLET | Freq: Four times a day (QID) | ORAL | Status: DC | PRN
  Administered 2022-03-31: 650 mg via ORAL
  Filled 2022-03-31: qty 2

## 2022-03-31 NOTE — Progress Notes (Signed)
Intrapartum Progress Note  Came to assess patient as minimal cervical change noted after multiple doses of cytotec and placement with expulsion of Cook's catheter.   S: Patient is noting some contractions, mild to moderate at times. Also noting some leaking of fluid.  Patient ambulated to restroom and reported some bloody show.  O: Blood pressure 130/79, pulse 87, temperature 99.2 F (37.3 C), temperature source Oral, resp. rate 18, height 5\' 3"  (1.6 m), weight 93.9 kg, last menstrual period 06/24/2021. Gen App: NAD, mildly uncomfortable Abdomen: soft, gravid FHT: baseline 155 bpm.  Accels present.  Decels present - variable, occasional . Moderate in degree variability.   Tocometer: contractions q 2-3 minutes Cervix: 2/50-60/-3. New foley bulb placed, with stretcing of cervix noted to 3 cm during placement Extremities: Nontender, no edema.  Pitocin: None  Labs:    Assessment:  1: SIUP at [redacted]w[redacted]d 2. Oligohydramnios 3. GBS positive   Plan:  1. Continue IOL. Foley bulb placed, likely will be expelled soon. Received most recent dose of Cytotec at 2100.  Can begin Pitocin at next check.  2. PCN for GBS prophylaxis 3. Patient desires IV pain management, declines epidural at this time.    2101, MD 03/31/2022 10:22 PM

## 2022-03-31 NOTE — Progress Notes (Addendum)
LABOR NOTE   SUBJECTIVE:   Carol Carter is a 31 y.o.  G1P0000  at [redacted]w[redacted]d whose labor is being induced for oligohydramnios. Her Cook catheter was expelled, and her cervix was found to be 1/50/-3. Unsuccessfully attempted to place a new Cook catheter. Carol Carter is having regular contractions but is not feeling them. We discussed options, and she is in agreement with the plan to stop Pitocin and resume misoprostol dosing once contractions have spaced out.  Analgesia: Labor support without medications  OBJECTIVE:  BP 97/76   Pulse 84   Temp 98.6 F (37 C) (Oral)   Resp 18   Ht 5\' 3"  (1.6 m)   Wt 93.9 kg   LMP 06/24/2021 (Exact Date)   BMI 36.67 kg/m  No intake/output data recorded.  SVE:   Dilation: 1 Effacement (%): 50 Station: -3 Exam by:: [email or web address] CNM CONTRACTIONS: regular, every 2-3 minutes, mild FHR: Fetal heart tracing reviewed. Baseline: 150 Variability: moderate, minimal Accelerations: present Decelerations: variable, intermittent Category 2  Labs: Lab Results  Component Value Date   WBC 7.4 03/30/2022   HGB 10.6 (L) 03/30/2022   HCT 31.6 (L) 03/30/2022   MCV 83.4 03/30/2022   PLT 390 03/30/2022    ASSESSMENT: 1)  Cervical ripening     Coping: well     Membranes: intact      Principal Problem:   Oligohydramnios Active Problems:   Encounter for planned induction of labor   PLAN: Discontinue Pitocin Place misoprostol once contractions have spaced out Anticipate NSVD Dr. 04/01/2022 updated  Valentino Saxon, CNM 03/31/2022 4:18 PM

## 2022-03-31 NOTE — Progress Notes (Addendum)
LABOR NOTE   SUBJECTIVE:   Carol Carter is a 31 y.o.  G1P0000  at [redacted]w[redacted]d whose labor is being induced for oligohydramnios. She has received 3 doses of misoprostol. Fetal monitoring has been Category 2 with intermittent late and variable decelerations. We discussed the option of Cook catheter with low dose Pitocin, and Carol Carter is in agreement with this plan.   Analgesia: Labor support without medications  OBJECTIVE:  BP 109/62 (BP Location: Left Arm)   Pulse 80   Temp 98.3 F (36.8 C) (Oral)   Resp 18   Ht 5\' 3"  (1.6 m)   Wt 93.9 kg   LMP 06/24/2021 (Exact Date)   BMI 36.67 kg/m  No intake/output data recorded.  SVE:   Dilation: Fingertip Effacement (%): 50 Station: -3 Exam by:: [email or web address] Dominic CONTRACTIONS: irregular, every 1.5-3 minutes FHR: Fetal heart tracing reviewed. Baseline: 145 Variability: moderate, minimal Accelerations: Decelerations:variable and late, intermittent Category 2  Labs: Lab Results  Component Value Date   WBC 7.4 03/30/2022   HGB 10.6 (L) 03/30/2022   HCT 31.6 (L) 03/30/2022   MCV 83.4 03/30/2022   PLT 390 03/30/2022    ASSESSMENT: 1)  Induction of labor for oligohydramnios. Cervical ripening.     Coping: Excellent. Good support.     Membranes: intact      Category 2 FHR tracing  Principal Problem:   Oligohydramnios   PLAN: Cook catheter placed Will start low-dose Pitocin for cervical ripening GBS prophylaxis in active labor Anticipate NSVD Dr. 04/01/2022 updated  Valentino Saxon, CNM 03/31/2022 9:48 AM

## 2022-03-31 NOTE — Progress Notes (Signed)
LABOR NOTE   SUBJECTIVE:   Carol Carter is a 31 y.o.  G1P0000  at [redacted]w[redacted]d whose labor is being induced for oligohydramnios. She is receiving misoprostol for cervical ripening and has been having painful contractions for the past few hours. She is reporting frequent gushes of clear fluid with contractions. Her cervix is softer than her previous exam but not  more dilated.  Analgesia: Labor support without medications  OBJECTIVE:  BP 130/79 (BP Location: Right Arm)   Pulse 87   Temp 99.3 F (37.4 C) (Oral) Comment: Provider notified  Resp 18   Ht 5\' 3"  (1.6 m)   Wt 93.9 kg   LMP 06/24/2021 (Exact Date)   BMI 36.67 kg/m  No intake/output data recorded.  SVE:   Dilation: 1 Effacement (%): 60 Station: -3 Exam by:: Dailyn Reith, CNM CONTRACTIONS: regular, every 2-3 minutes FHR: Fetal heart tracing reviewed. Baseline: 145 Variability: moderate, minimal Accelerations: present Decelerations:variable and early Category 2  Labs: Lab Results  Component Value Date   WBC 7.4 03/30/2022   HGB 10.6 (L) 03/30/2022   HCT 31.6 (L) 03/30/2022   MCV 83.4 03/30/2022   PLT 390 03/30/2022    ASSESSMENT: 1)  Cervical ripening     Coping: tearful but coping well     Membranes: possibly ruptured       Principal Problem:   Oligohydramnios Active Problems:   Encounter for planned induction of labor   PLAN: Continue cervical ripening with misoprostol Anticipate NSVD Dr.Cherry updated; she will come to place Foley balloon.  04/01/2022, CNM 03/31/2022 8:39 PM

## 2022-03-31 NOTE — Progress Notes (Signed)
   Subjective:  Pt comfortable, does not feel contractions, family members at her bedside.   Objective:   Vitals: Blood pressure 111/72, pulse 75, temperature 98.2 F (36.8 C), temperature source Oral, resp. rate 15, height 5\' 3"  (1.6 m), weight 93.9 kg, last menstrual period 06/24/2021. General:  Abdomen: Cervical Exam:  Dilation: Fingertip Effacement (%): 50 Cervical Position: Anterior Station: -3 Presentation: Vertex Exam by:: [email or web address]  FHT: baseline 150, min to moderate variability, accel present, later or variable present Toco:q 1-3, soft resting tone   Results for orders placed or performed during the hospital encounter of 03/30/22 (from the past 24 hour(s))  CBC     Status: Abnormal   Collection Time: 03/30/22  6:58 PM  Result Value Ref Range   WBC 7.4 4.0 - 10.5 K/uL   RBC 3.79 (L) 3.87 - 5.11 MIL/uL   Hemoglobin 10.6 (L) 12.0 - 15.0 g/dL   HCT 04/01/22 (L) 93.2 - 35.5 %   MCV 83.4 80.0 - 100.0 fL   MCH 28.0 26.0 - 34.0 pg   MCHC 33.5 30.0 - 36.0 g/dL   RDW 73.2 20.2 - 54.2 %   Platelets 390 150 - 400 K/uL   nRBC 0.0 0.0 - 0.2 %  Type and screen     Status: None   Collection Time: 03/30/22  7:19 PM  Result Value Ref Range   ABO/RH(D) A POS    Antibody Screen NEG    Sample Expiration      04/02/2022,2359 Performed at [facility], [address] [place], [place]     Assessment:   31 y.o. G1P0000 [redacted]w[redacted]d admitted for IOL for oligohydramnios   Plan:   1) Labor -Cytotec #2 placed at 0445, FB when able.   2) Fetus - category II tracing  3) GBS pos, PCN ordered to be started once in labor,  membranes intact  4) Pain management: Aware of all options will ask if desired   Called to room for lates and decels, pt repositioned and IVF bolus given, resolved with interventions. Will proceed with induction.     [redacted]w[redacted]d, CNM  [facility]  03/31/2022 6:45 AM

## 2022-03-31 NOTE — Progress Notes (Signed)
LABOR NOTE   SUBJECTIVE:   Carol Carter is a 31 y.o.  G1P0000  at [redacted]w[redacted]d whose labor is being induced for oligohydramnios. Her Cook catheter is in place. She is receiving Pitocin at 6 mU/min and is feeling occasional contractions. She is resting now.   Analgesia: Labor support without medications  OBJECTIVE:  BP 97/76   Pulse 84   Temp 98.6 F (37 C) (Oral)   Resp 18   Ht 5\' 3"  (1.6 m)   Wt 93.9 kg   LMP 06/24/2021 (Exact Date)   BMI 36.67 kg/m  No intake/output data recorded.  SVE:   Dilation: Fingertip Effacement (%): 50 Station: -3 Exam by:: [email or web address] Dominic CONTRACTIONS: irregular, every 2-5 minutes FHR: Fetal heart tracing reviewed. Baseline: 145 Variability: moderate, minimal Accelerations: present Decelerations:variable and late, intermittent Category 2  Labs: Lab Results  Component Value Date   WBC 7.4 03/30/2022   HGB 10.6 (L) 03/30/2022   HCT 31.6 (L) 03/30/2022   MCV 83.4 03/30/2022   PLT 390 03/30/2022    ASSESSMENT: 1)  Cervical ripening     Coping: well. Support at bedside     Membranes: intact    Principal Problem:   Oligohydramnios Active Problems:   Encounter for planned induction of labor   PLAN: Continue cervical ripening Encourage frequent position changes Anticipate NSVD   04/01/2022, CNM 03/31/2022 2:24 PM

## 2022-04-01 ENCOUNTER — Inpatient Hospital Stay: Payer: 59 | Admitting: Anesthesiology

## 2022-04-01 ENCOUNTER — Encounter
Admission: EM | Disposition: A | Discharge status: Home / Self Care | Payer: Self-pay | Source: Home / Self Care | Attending: Advanced Practice Midwife

## 2022-04-01 ENCOUNTER — Encounter: Payer: Self-pay | Admitting: Obstetrics and Gynecology

## 2022-04-01 DIAGNOSIS — O99214 Obesity complicating childbirth: Secondary | ICD-10-CM

## 2022-04-01 DIAGNOSIS — E669 Obesity, unspecified: Secondary | ICD-10-CM

## 2022-04-01 DIAGNOSIS — O4103X0 Oligohydramnios, third trimester, not applicable or unspecified: Secondary | ICD-10-CM

## 2022-04-01 DIAGNOSIS — O99820 Streptococcus B carrier state complicating pregnancy: Secondary | ICD-10-CM

## 2022-04-01 DIAGNOSIS — Z3A40 40 weeks gestation of pregnancy: Secondary | ICD-10-CM

## 2022-04-01 DIAGNOSIS — O480 Post-term pregnancy: Secondary | ICD-10-CM

## 2022-04-01 SURGERY — Surgical Case
Anesthesia: Epidural

## 2022-04-01 MED ORDER — DIPHENHYDRAMINE HCL 25 MG PO CAPS: 25.0000 mg | ORAL_CAPSULE | Freq: Four times a day (QID) | ORAL | Status: DC | PRN

## 2022-04-01 MED ORDER — LIDOCAINE HCL (PF) 2 % IJ SOLN
INTRAMUSCULAR | Status: DC | PRN
  Administered 2022-04-01 (×4): 5 mg via INTRADERMAL

## 2022-04-01 MED ORDER — WITCH HAZEL-GLYCERIN EX PADS: 1.0000 | MEDICATED_PAD | CUTANEOUS | Status: DC | PRN

## 2022-04-01 MED ORDER — TRIAMCINOLONE ACETONIDE 40 MG/ML IJ SUSP
10.0000 mg | Freq: Once | INTRAMUSCULAR | Status: AC
  Administered 2022-04-01: 40 mg via INTRAMUSCULAR
  Filled 2022-04-01 (×2): qty 0.25

## 2022-04-01 MED ORDER — MAGNESIUM HYDROXIDE 400 MG/5ML PO SUSP: 30.0000 mL | ORAL | Status: DC | PRN

## 2022-04-01 MED ORDER — OXYTOCIN-SODIUM CHLORIDE 30-0.9 UT/500ML-% IV SOLN
INTRAVENOUS | Status: AC
  Filled 2022-04-01: qty 500

## 2022-04-01 MED ORDER — BUPIVACAINE LIPOSOME 1.3 % IJ SUSP: Freq: Once | INTRAMUSCULAR | Status: DC

## 2022-04-01 MED ORDER — EPHEDRINE 5 MG/ML INJ: 10.0000 mg | INTRAVENOUS | Status: DC | PRN

## 2022-04-01 MED ORDER — LIDOCAINE HCL 2 % IJ SOLN
INTRAMUSCULAR | Status: AC
  Filled 2022-04-01: qty 20

## 2022-04-01 MED ORDER — CHLORHEXIDINE GLUCONATE 0.12 % MT SOLN
15.0000 mL | OROMUCOSAL | Status: AC
  Administered 2022-04-01: 15 mL via OROMUCOSAL

## 2022-04-01 MED ORDER — SIMETHICONE 80 MG PO CHEW: 80.0000 mg | CHEWABLE_TABLET | ORAL | Status: DC | PRN

## 2022-04-01 MED ORDER — BUPIVACAINE HCL (PF) 0.25 % IJ SOLN
INTRAMUSCULAR | Status: DC | PRN
  Administered 2022-04-01: 2 mL via EPIDURAL
  Administered 2022-04-01: 4 mL via EPIDURAL

## 2022-04-01 MED ORDER — SODIUM CHLORIDE 0.9 % IV SOLN
500.0000 mg | INTRAVENOUS | Status: AC
  Administered 2022-04-01: 250 mg via INTRAVENOUS
  Filled 2022-04-01: qty 5

## 2022-04-01 MED ORDER — CEFAZOLIN SODIUM-DEXTROSE 2-4 GM/100ML-% IV SOLN
INTRAVENOUS | Status: AC
  Administered 2022-04-01: 2 g via INTRAVENOUS
  Filled 2022-04-01: qty 100

## 2022-04-01 MED ORDER — LABETALOL HCL 5 MG/ML IV SOLN
INTRAVENOUS | Status: AC
  Filled 2022-04-01: qty 4

## 2022-04-01 MED ORDER — BUPIVACAINE HCL 0.5 % IJ SOLN
INTRAMUSCULAR | Status: DC | PRN
  Administered 2022-04-01: 30 mL

## 2022-04-01 MED ORDER — SOD CITRATE-CITRIC ACID 500-334 MG/5ML PO SOLN
30.0000 mL | ORAL | Status: AC
  Administered 2022-04-01: 30 mL via ORAL

## 2022-04-01 MED ORDER — CEFAZOLIN SODIUM-DEXTROSE 2-4 GM/100ML-% IV SOLN: 2.0000 g | INTRAVENOUS | Status: AC

## 2022-04-01 MED ORDER — DIBUCAINE (PERIANAL) 1 % EX OINT: 1.0000 | TOPICAL_OINTMENT | CUTANEOUS | Status: DC | PRN

## 2022-04-01 MED ORDER — ACETAMINOPHEN 500 MG PO TABS
1000.0000 mg | ORAL_TABLET | Freq: Four times a day (QID) | ORAL | Status: DC
  Administered 2022-04-02 – 2022-04-03 (×5): 1000 mg via ORAL
  Filled 2022-04-01 (×5): qty 2

## 2022-04-01 MED ORDER — ONDANSETRON HCL 4 MG/2ML IJ SOLN
INTRAMUSCULAR | Status: AC
  Filled 2022-04-01: qty 2

## 2022-04-01 MED ORDER — SENNOSIDES-DOCUSATE SODIUM 8.6-50 MG PO TABS
2.0000 | ORAL_TABLET | Freq: Every day | ORAL | Status: DC
  Administered 2022-04-02 – 2022-04-03 (×2): 2 via ORAL
  Filled 2022-04-01 (×2): qty 2

## 2022-04-01 MED ORDER — HYDROMORPHONE HCL 1 MG/ML IJ SOLN: 1.0000 mg | INTRAMUSCULAR | Status: DC | PRN

## 2022-04-01 MED ORDER — COCONUT OIL OIL
1.0000 | TOPICAL_OIL | Status: DC | PRN
  Filled 2022-04-01: qty 7.5

## 2022-04-01 MED ORDER — BUPIVACAINE HCL (PF) 0.5 % IJ SOLN
INTRAMUSCULAR | Status: AC
  Filled 2022-04-01: qty 30

## 2022-04-01 MED ORDER — MORPHINE SULFATE (PF) 0.5 MG/ML IJ SOLN
INTRAMUSCULAR | Status: DC | PRN
  Administered 2022-04-01: 3 mg via EPIDURAL

## 2022-04-01 MED ORDER — PHENYLEPHRINE 80 MCG/ML (10ML) SYRINGE FOR IV PUSH (FOR BLOOD PRESSURE SUPPORT): 80.0000 ug | PREFILLED_SYRINGE | INTRAVENOUS | Status: DC | PRN

## 2022-04-01 MED ORDER — BUPIVACAINE LIPOSOME 1.3 % IJ SUSP
INTRAMUSCULAR | Status: AC
  Filled 2022-04-01: qty 20

## 2022-04-01 MED ORDER — OXYCODONE HCL 5 MG PO TABS
5.0000 mg | ORAL_TABLET | ORAL | Status: DC | PRN
  Administered 2022-04-01: 10 mg via ORAL
  Filled 2022-04-01: qty 2

## 2022-04-01 MED ORDER — PHENYLEPHRINE 80 MCG/ML (10ML) SYRINGE FOR IV PUSH (FOR BLOOD PRESSURE SUPPORT)
PREFILLED_SYRINGE | INTRAVENOUS | Status: DC | PRN
  Administered 2022-04-01: 80 ug via INTRAVENOUS

## 2022-04-01 MED ORDER — GABAPENTIN 300 MG PO CAPS
300.0000 mg | ORAL_CAPSULE | Freq: Two times a day (BID) | ORAL | Status: DC
  Administered 2022-04-01 – 2022-04-03 (×4): 300 mg via ORAL
  Filled 2022-04-01 (×4): qty 1

## 2022-04-01 MED ORDER — SOD CITRATE-CITRIC ACID 500-334 MG/5ML PO SOLN
ORAL | Status: AC
  Filled 2022-04-01: qty 15

## 2022-04-01 MED ORDER — FERROUS SULFATE 325 (65 FE) MG PO TABS
325.0000 mg | ORAL_TABLET | ORAL | Status: DC
  Administered 2022-04-02: 325 mg via ORAL
  Filled 2022-04-01: qty 1

## 2022-04-01 MED ORDER — PHENYLEPHRINE HCL-NACL 20-0.9 MG/250ML-% IV SOLN
INTRAVENOUS | Status: AC
  Filled 2022-04-01: qty 250

## 2022-04-01 MED ORDER — KETOROLAC TROMETHAMINE 30 MG/ML IJ SOLN
30.0000 mg | Freq: Four times a day (QID) | INTRAMUSCULAR | Status: AC
  Administered 2022-04-02 (×3): 30 mg via INTRAVENOUS
  Filled 2022-04-01 (×3): qty 1

## 2022-04-01 MED ORDER — LIDOCAINE HCL (PF) 1 % IJ SOLN
INTRAMUSCULAR | Status: DC | PRN
  Administered 2022-04-01: 3 mL via SUBCUTANEOUS

## 2022-04-01 MED ORDER — OXYTOCIN-SODIUM CHLORIDE 30-0.9 UT/500ML-% IV SOLN
2.5000 [IU]/h | INTRAVENOUS | Status: AC
  Administered 2022-04-02: 2.5 [IU]/h via INTRAVENOUS
  Filled 2022-04-01 (×2): qty 500

## 2022-04-01 MED ORDER — LABETALOL HCL 5 MG/ML IV SOLN
INTRAVENOUS | Status: DC | PRN
  Administered 2022-04-01: 5 mg via INTRAVENOUS

## 2022-04-01 MED ORDER — BUPIVACAINE LIPOSOME 1.3 % IJ SUSP
INTRAMUSCULAR | Status: DC | PRN
  Administered 2022-04-01: 20 mL

## 2022-04-01 MED ORDER — DIPHENHYDRAMINE HCL 50 MG/ML IJ SOLN: 12.5000 mg | INTRAMUSCULAR | Status: DC | PRN

## 2022-04-01 MED ORDER — IBUPROFEN 600 MG PO TABS
600.0000 mg | ORAL_TABLET | Freq: Four times a day (QID) | ORAL | Status: DC
  Administered 2022-04-03 (×2): 600 mg via ORAL
  Filled 2022-04-01 (×2): qty 1

## 2022-04-01 MED ORDER — 0.9 % SODIUM CHLORIDE (POUR BTL) OPTIME
TOPICAL | Status: DC | PRN
  Administered 2022-04-01: 1000 mL

## 2022-04-01 MED ORDER — LACTATED RINGERS AMNIOINFUSION
INTRAVENOUS | Status: DC
  Filled 2022-04-01 (×3): qty 1000

## 2022-04-01 MED ORDER — LACTATED RINGERS IV SOLN: INTRAVENOUS | Status: DC

## 2022-04-01 MED ORDER — FENTANYL-BUPIVACAINE-NACL 0.5-0.125-0.9 MG/250ML-% EP SOLN
12.0000 mL/h | EPIDURAL | Status: DC | PRN
  Administered 2022-04-01: 12 mL/h via EPIDURAL
  Filled 2022-04-01: qty 250

## 2022-04-01 MED ORDER — FENTANYL CITRATE (PF) 100 MCG/2ML IJ SOLN
INTRAMUSCULAR | Status: DC | PRN
  Administered 2022-04-01: 100 ug via INTRAVENOUS

## 2022-04-01 MED ORDER — SODIUM CHLORIDE (PF) 0.9 % IJ SOLN
INTRAMUSCULAR | Status: AC
  Filled 2022-04-01: qty 50

## 2022-04-01 MED ORDER — PRENATAL MULTIVITAMIN CH
1.0000 | ORAL_TABLET | Freq: Every day | ORAL | Status: DC
  Administered 2022-04-02: 1 via ORAL
  Filled 2022-04-01: qty 1

## 2022-04-01 MED ORDER — LIDOCAINE-EPINEPHRINE (PF) 1.5 %-1:200000 IJ SOLN
INTRAMUSCULAR | Status: DC | PRN
  Administered 2022-04-01: 4 mL via EPIDURAL

## 2022-04-01 MED ORDER — MORPHINE SULFATE (PF) 0.5 MG/ML IJ SOLN
INTRAMUSCULAR | Status: AC
  Filled 2022-04-01: qty 10

## 2022-04-01 MED ORDER — LACTATED RINGERS IV SOLN
500.0000 mL | Freq: Once | INTRAVENOUS | Status: AC
  Administered 2022-04-01: 500 mL via INTRAVENOUS

## 2022-04-01 MED ORDER — ONDANSETRON HCL 4 MG/2ML IJ SOLN
INTRAMUSCULAR | Status: DC | PRN
  Administered 2022-04-01: 4 mg via INTRAVENOUS

## 2022-04-01 MED ORDER — ZOLPIDEM TARTRATE 5 MG PO TABS: 5.0000 mg | ORAL_TABLET | Freq: Every evening | ORAL | Status: DC | PRN

## 2022-04-01 MED ORDER — MENTHOL 3 MG MT LOZG: 1.0000 | LOZENGE | OROMUCOSAL | Status: DC | PRN

## 2022-04-01 MED ORDER — FENTANYL CITRATE (PF) 100 MCG/2ML IJ SOLN
INTRAMUSCULAR | Status: AC
  Filled 2022-04-01: qty 2

## 2022-04-01 SURGICAL SUPPLY — 30 items
BAG COUNTER SPONGE SURGICOUNT (BAG) ×1 IMPLANT
BENZOIN TINCTURE PRP APPL 2/3 (GAUZE/BANDAGES/DRESSINGS) IMPLANT
CHLORAPREP W/TINT 26 (MISCELLANEOUS) ×2 IMPLANT
CLOSURE STERI STRIP 1/2 X4 (GAUZE/BANDAGES/DRESSINGS) IMPLANT
DRSG TELFA 3X8 NADH STRL (GAUZE/BANDAGES/DRESSINGS) ×1 IMPLANT
ELECT REM PT RETURN 9FT ADLT (ELECTROSURGICAL) ×1
ELECTRODE REM PT RTRN 9FT ADLT (ELECTROSURGICAL) ×1 IMPLANT
EXTRT SYSTEM ALEXIS 17CM (MISCELLANEOUS)
GAUZE SPONGE 4X4 12PLY STRL (GAUZE/BANDAGES/DRESSINGS) ×1 IMPLANT
GLOVE BIO SURGEON STRL SZ 6.5 (GLOVE) ×1 IMPLANT
GLOVE SURG UNDER LTX SZ7 (GLOVE) ×1 IMPLANT
GOWN STRL REUS W/ TWL LRG LVL3 (GOWN DISPOSABLE) ×2 IMPLANT
GOWN STRL REUS W/TWL LRG LVL3 (GOWN DISPOSABLE) ×2
KIT TURNOVER KIT A (KITS) ×1 IMPLANT
MANIFOLD NEPTUNE II (INSTRUMENTS) ×1 IMPLANT
MAT PREVALON FULL STRYKER (MISCELLANEOUS) ×1 IMPLANT
NS IRRIG 1000ML POUR BTL (IV SOLUTION) ×1 IMPLANT
PACK C SECTION AR (MISCELLANEOUS) ×1 IMPLANT
PAD ABD 7.5X8 STRL (GAUZE/BANDAGES/DRESSINGS) IMPLANT
PAD OB MATERNITY 4.3X12.25 (PERSONAL CARE ITEMS) ×1 IMPLANT
PAD PREP 24X41 OB/GYN DISP (PERSONAL CARE ITEMS) ×1 IMPLANT
SCRUB CHG 4% DYNA-HEX 4OZ (MISCELLANEOUS) ×1 IMPLANT
SUT MNCRL AB 4-0 PS2 18 (SUTURE) ×1 IMPLANT
SUT PLAIN 2 0 XLH (SUTURE) IMPLANT
SUT VIC AB 0 CT1 36 (SUTURE) ×4 IMPLANT
SUT VIC AB 3-0 SH 27 (SUTURE) ×1
SUT VIC AB 3-0 SH 27X BRD (SUTURE) ×1 IMPLANT
SYSTEM CONTND EXTRCTN KII BLLN (MISCELLANEOUS) IMPLANT
TRAP FLUID SMOKE EVACUATOR (MISCELLANEOUS) ×1 IMPLANT
WATER STERILE IRR 500ML POUR (IV SOLUTION) ×1 IMPLANT

## 2022-04-01 NOTE — Progress Notes (Signed)
[place] tracing maternal HR from [phone]. RN at bedside as patient is continually changing positions to hands and knees and rocking. [place] adjusted as patient remains in same position, FHT at 0602 is 150bpm.

## 2022-04-01 NOTE — Progress Notes (Signed)
PROGRESS NOTE  Carol Carter is now requesting cesarean birth. Discussed options, including epidural, IUPC, and continued titration of Pitocin. Dr. Valentino Saxon notified of this request and will come in to discuss options.  Guadlupe Spanish, CNM 04/01/22 7:25 AM

## 2022-04-01 NOTE — Progress Notes (Signed)
LABOR NOTE   SUBJECTIVE:   Carol Carter is a 31 y.o.  G1P0000  at [redacted]w[redacted]d whose labor is being induced for oligohydramnios. Her Foley bulb has now been expelled. She describes her contractions as mild. Her cervix has changed to 3 cm.  Analgesia: Labor support without medications  OBJECTIVE:  BP 119/72 (BP Location: Left Arm)   Pulse 80   Temp 99 F (37.2 C) (Oral)   Resp 18   Ht 5\' 3"  (1.6 m)   Wt 93.9 kg   LMP 06/24/2021 (Exact Date)   BMI 36.67 kg/m  No intake/output data recorded.  SVE:   Dilation: 3 Effacement (%): 60 Station: -3 Exam by:: Agam Davenport, CNM CONTRACTIONS: irregular, every 2-4 minutes FHR: Fetal heart tracing reviewed. Baseline: 150 Variability: moderate, minimal Accelerations: present Decelerations:variable and late, intermittent Category 2  Labs: Lab Results  Component Value Date   WBC 7.4 03/30/2022   HGB 10.6 (L) 03/30/2022   HCT 31.6 (L) 03/30/2022   MCV 83.4 03/30/2022   PLT 390 03/30/2022    ASSESSMENT: 1) Cervical ripening, making cervical change     Coping: well     Membranes: ruptured, clear fluid   Principal Problem:   Oligohydramnios Active Problems:   Encounter for planned induction of labor   PLAN: Start Pitocin 4 hours after last dose of misoprostol (approximately 0100) GBS prophylaxis Anticipate NSVD  04/01/2022, CNM 04/01/2022 12:23 AM

## 2022-04-01 NOTE — Progress Notes (Addendum)
LABOR NOTE   SUBJECTIVE:   Carol Carter is a 31 y.o.  G1P0000  at [redacted]w[redacted]d whose labor is being induced for oligohydramnios. She is currently receiving Pitocin at 6 mU/ min and having painful contractions. She has made minimal cervical change from her prior exam and is feeling exhausted.  Analgesia: Labor support without medications  OBJECTIVE:  BP 134/86 (BP Location: Right Arm)   Pulse 88   Temp 99.2 F (37.3 C) (Oral)   Resp 18   Ht 5\' 3"  (1.6 m)   Wt 93.9 kg   LMP 06/24/2021 (Exact Date)   BMI 36.67 kg/m  No intake/output data recorded.  SVE:   Dilation: 3.5 Effacement (%): 70 Station: -2 Exam by:: Mohamed Portlock,CNM CONTRACTIONS: regular, every 2-5 minutes FHR: Fetal heart tracing reviewed. Baseline: 145, occasionally tracing maternal HR d/t position Variability: moderate, minimal Accelerations: no Decelerations:late, intermittent Category 2  Labs: Lab Results  Component Value Date   WBC 7.4 03/30/2022   HGB 10.6 (L) 03/30/2022   HCT 31.6 (L) 03/30/2022   MCV 83.4 03/30/2022   PLT 390 03/30/2022    ASSESSMENT: 1)  Induction of labor, minimal progress on Pitocin     Coping: tired but coping well     Membranes: ruptured, clear fluid    Principal Problem:   Oligohydramnios Active Problems:   Encounter for planned induction of labor   PLAN: Recommend IUPC; discussed concerns regarding minimal cervical change, decreased variability, and possible need for cesarean birth. Shelsy declines IUPC at this time. Consulted with Dr. 04/01/2022 regarding this information as well as fetal monitoring strip and minimal cervical change. After reviewing the strip, her recommendation is continuing to titrate Pitocin. Anticipate NSVD   Valentino Saxon, CNM 04/01/2022 6:10 AM

## 2022-04-01 NOTE — Anesthesia Preprocedure Evaluation (Addendum)
Anesthesia Evaluation  Patient identified by MRN, date of birth, ID band Patient awake    Reviewed: Allergy & Precautions, H&P , NPO status , Patient's Chart, lab work & pertinent test results, reviewed documented beta blocker date and time   Airway Mallampati: II  TM Distance: >3 FB Neck ROM: full    Dental no notable dental hx. (+) Teeth Intact   Pulmonary asthma , sleep apnea , Patient abstained from smoking., former smoker   Pulmonary exam normal breath sounds clear to auscultation       Cardiovascular Exercise Tolerance: Good negative cardio ROS  Rhythm:regular Rate:Normal     Neuro/Psych negative neurological ROS  negative psych ROS   GI/Hepatic Neg liver ROS,GERD  Controlled,,  Endo/Other  negative endocrine ROSdiabetes    Renal/GU negative Renal ROS  negative genitourinary   Musculoskeletal   Abdominal   Peds  Hematology negative hematology ROS (+)   Anesthesia Other Findings   Reproductive/Obstetrics (+) Pregnancy                             Anesthesia Physical Anesthesia Plan  ASA: 3  Anesthesia Plan: Epidural   Post-op Pain Management:    Induction:   PONV Risk Score and Plan:   Airway Management Planned:   Additional Equipment:   Intra-op Plan:   Post-operative Plan:   Informed Consent: I have reviewed the patients History and Physical, chart, labs and discussed the procedure including the risks, benefits and alternatives for the proposed anesthesia with the patient or authorized representative who has indicated his/her understanding and acceptance.       Plan Discussed with:   Anesthesia Plan Comments:        Anesthesia Quick Evaluation

## 2022-04-01 NOTE — Transfer of Care (Signed)
Immediate Anesthesia Transfer of Care Note  Patient: Carol Carter  Procedure(s) Performed: CESAREAN SECTION  Patient Location: Mother/Baby  Anesthesia Type:Epidural  Level of Consciousness: awake, alert , and oriented  Airway & Oxygen Therapy: Patient Spontanous Breathing  Post-op Assessment: Report given to RN and Post -op Vital signs reviewed and stable  Post vital signs: Reviewed and stable  Last Vitals:  Vitals Value Taken Time  BP    Temp    Pulse    Resp    SpO2      Last Pain:  Vitals:   04/01/22 1615  TempSrc: Oral  PainSc:       Patients Stated Pain Goal: 0 (04/01/22 0725)  Complications: No notable events documented.

## 2022-04-01 NOTE — Anesthesia Procedure Notes (Signed)
Epidural Patient location during procedure: OB Start time: 04/01/2022 8:16 AM End time: 04/01/2022 8:31 AM  Staffing Anesthesiologist: Yevette Edwards, MD Resident/CRNA: Irving Burton, CRNA Performed: resident/CRNA   Preanesthetic Checklist Completed: patient identified, IV checked, site marked, risks and benefits discussed, surgical consent, monitors and equipment checked, pre-op evaluation and timeout performed  Epidural Patient position: sitting Prep: Betadine Patient monitoring: heart rate, continuous pulse ox and blood pressure Approach: midline Location: L4-L5 Injection technique: LOR air  Needle:  Needle type: Tuohy  Needle gauge: 18 G Needle length: 9 cm and 9 Needle insertion depth: 8 cm Catheter type: closed end flexible Catheter size: 20 Guage Catheter at skin depth: 13 cm Test dose: negative and 1.5% lidocaine with Epi 1:200 K  Assessment Sensory level: T10 Events: blood not aspirated, no cerebrospinal fluid, injection not painful, no injection resistance, no paresthesia and negative IV test  Additional Notes Pt's history reviewed and consent obtained as per OB consent Patient tolerated the insertion well without complications. Negative SATD, negative IVTD All VSS were obtained and monitored through OBIX and nursing protocols followed.Reason for block:procedure for pain

## 2022-04-01 NOTE — Op Note (Signed)
Cesarean Section Procedure Note  Indications: failure to progress: arrest of dilation  Pre-operative Diagnosis: 40 week 1 day pregnancy, oligohydramnios, mild obesity (BMI 36), arrest of dilation, and GBS positive.  Post-operative Diagnosis: Same  Surgeon: Hildred Laser, MD  Assistants:  Tresea Mall  Procedure: Primary low transverse Cesarean Section  Anesthesia: Epidural anesthesia  Findings: Female infant, cephalic presentation, 3710 grams, with Apgar scores of 8 at one minute and 8 at five minutes. Intact placenta with 3 vessel cord.  Clear amniotic fluid at amniotomy, s/p amnioinfusion The uterine outline, tubes and ovaries appeared normal.   Procedure Details: The patient was seen in the Holding Room. The risks, benefits, complications, treatment options, and expected outcomes were discussed with the patient.  The patient concurred with the proposed plan, giving informed consent.  The site of surgery properly noted/marked. The patient was taken to the Operating Room, identified as Carol Carter and the procedure verified as C-Section Delivery.   Epidural anesthesia redosed for operative delivery, tested and noted to be adequate.  The patient was then draped and prepped in the usual sterile manner.  A Time Out was held and the above information confirmed. A Pfannenstiel incision was made and carried down through the subcutaneous tissue to the fascia. Fascial incision was made and extended transversely. The fascia was separated from the underlying rectus tissue superiorly and inferiorly. The peritoneum was identified and entered. Peritoneal incision was extended longitudinally. The surgical assist was able to provide retraction to allow for clear visualization of surgical site. An Alexis retractor was placed in the abdomen for additional retraction. The utero-vesical peritoneal reflection was incised transversely and the bladder flap was bluntly freed from the lower uterine segment. A  low transverse uterine incision was made. Delivered from cephalic presentation was a 3710 gram Female with Apgar scores of 8 at one minute and 8 at five minutes.  The assistant was able to apply adequate fundal pressure to allow for successful delivery of the fetus. After the umbilical cord was clamped and cut, cord blood was obtained for evaluation. Delayed cord clamping was observed. The placenta was removed intact and appeared normal. The uterus was exteriorized and cleared of all clots and debris. The uterine outline, tubes and ovaries appeared normal.  The uterine incision was closed with running locked sutures of 0-Vicryl.  A second suture of 0-Vicryl was used in an imbricating layer.  Oozing was still noted at several areas of the incision so a third suture of 0-VIcryl was used in a running fashion, incorporating the serosal layer. Hemostasis was observed. The uterus was then returned to the abdomen. The pericolic gutters were cleared of all clots and debris.  The fascia was then grasped with Zannie Cove and Tresa Endo clamps, and injected with a total of 60 ml of 1.3% Exparel solution (20 ml of bupivicaine liposomal mixed with 30 ml of 0.5% Marcaine and diluted in 50 ml of normal saline).  The fascia was then reapproximated with a running suture of 0-Vicryl. The subcutaneous fat layer was reapproximated with 2-0 Vicryl. The skin was reapproximated with 4-0 Monocryl. The skin and subcutaneous tissues were then injected with an additional 36 ml of the Exparel solution. The incision was covered with steri-strips and a pressure dressing.   Instrument, sponge, and needle counts were correct prior the abdominal closure and at the conclusion of the case.    An experienced assistant was required given the standard of surgical care given the complexity of the case.  This assistant was  needed for exposure, dissection, suctioning, retraction, instrument exchange, and for overall help during the procedure.  Estimated Blood  Loss:  605 ml      Drains: foley catheter to gravity drainage, 300 ml of clear urine at end of the procedure         Total IV Fluids:  625 ml  Specimens: None         Implants: None         Complications:  None; patient tolerated the procedure well.         Disposition: PACU - hemodynamically stable.         Condition: stable   Carol Maid, MD [place] [facility] at [facility]

## 2022-04-01 NOTE — Progress Notes (Addendum)
  Labor Progress Note   31 y.o. G1P0000 @ [redacted]w[redacted]d , admitted for  Pregnancy, Labor Management. Oligohydramnios   Subjective:  Comfortable with epidural  Objective:  BP 118/75   Pulse 94   Temp 99.4 F (37.4 C) (Oral)   Resp 18   Ht 5\' 3"  (1.6 m)   Wt 93.9 kg   LMP 06/24/2021 (Exact Date)   SpO2 96%   BMI 36.67 kg/m  Abd: gravid, ND, FHT present, mild tenderness on exam Extr: no edema SVE: CERVIX: 4 cm dilated, 80 effaced, -3 station IUPC placed  EFM: FHR: 145 bpm, variability: moderate,  accelerations:  Present,  decelerations:  Present late/variable  recurrent Toco: Frequency: Every 2-3 minutes Labs: I have reviewed the patient's lab results.   Assessment & Plan:  G1P0000 @ [redacted]w[redacted]d, admitted for  Pregnancy and Labor/Delivery Management  1. Pain management: epidural. 2. FWB: FHT category II.  3. ID: GBS positive: penicillin prophylaxis 4. Labor management: position changes, fluid bolus, cut pit in half, amnioinfusion. Dr [redacted]w[redacted]d aware  All discussed with patient, see orders   Valentino Saxon, CNM [facility] 04/01/2022  9:18 AM

## 2022-04-01 NOTE — Progress Notes (Signed)
Patient sitting in rocking chair for positioning. Anytime patient sits up/forward, [place] begins to trace maternal HR. FHT recorded once [place] begins to read FHR again.

## 2022-04-01 NOTE — Progress Notes (Signed)
  Labor Progress Note   31 y.o. G1P0000 @ [redacted]w[redacted]d , admitted for  Pregnancy, Labor Management. Oligohydramnios   Subjective:  Comfortable with epidural  Objective:  BP 129/84   Pulse (!) 102   Temp 98.7 F (37.1 C) (Oral)   Resp 18   Ht 5\' 3"  (1.6 m)   Wt 93.9 kg   LMP 06/24/2021 (Exact Date)   SpO2 98%   BMI 36.67 kg/m  Abd: gravid, ND, FHT present, mild tenderness on exam Extr: no edema SVE: CERVIX: 5 cm dilated, 80 effaced, -2 station Cervical sweep  EFM: FHR: 150 bpm, variability: moderate,  accelerations:  Present,  decelerations:  Absent Toco: Frequency: Every 1-3 minutes Labs: I have reviewed the patient's lab results.   Assessment & Plan:  G1P0000 @ [redacted]w[redacted]d, admitted for  Pregnancy and Labor/Delivery Management  1. Pain management: epidural. 2. FWB: FHT category I.  3. ID: GBS positive: penicillin prophylaxis 4. Labor management: continue pitocin titration and position changes  All discussed with patient, see orders   [redacted]w[redacted]d, CNM [place] [facility] 04/01/2022  1:36 PM

## 2022-04-01 NOTE — Progress Notes (Signed)
  Labor Progress Note   31 y.o. G1P0000 @ [redacted]w[redacted]d , admitted for  Pregnancy, Labor Management. oligohydramnios  Subjective:  Comfortable with epidural  Objective:  BP (!) 110/57   Pulse (!) 102   Temp 98.6 F (37 C) (Oral)   Resp 18   Ht 5\' 3"  (1.6 m)   Wt 93.9 kg   LMP 06/24/2021 (Exact Date)   SpO2 97%   BMI 36.67 kg/m  Abd: gravid, ND, FHT present, mild tenderness on exam Extr: no edema SVE: CERVIX: 5 cm dilated, 80 effaced, -1 station  EFM: FHR: 145 bpm, variability: moderate,  accelerations:  Present 10x10,  decelerations:  Absent Toco: Frequency: Every 2-3 minutes Labs: I have reviewed the patient's lab results.   Assessment & Plan:  G1P0000 @ [redacted]w[redacted]d, admitted for  Pregnancy and Labor/Delivery Management  1. Pain management: epidural. 2. FWB: FHT category I/II.  3. ID: GBS positive: penicillin prophylaxis 4. Labor management: reviewed minimal cervical change with Dr [redacted]w[redacted]d. Will discuss likelihood of C/S with patient  All discussed with patient, see orders   Valentino Saxon, CNM [place] [facility] 04/01/2022  5:34 PM

## 2022-04-01 NOTE — Progress Notes (Signed)
OB/GYN Attending Progress Note  Carol Carter is a 31 y.o. G1P0000 female at [redacted]w[redacted]d currently with arrest of labor, has been on Pitocin with adequate MVUs for the past 4 hours with no change. Currently undergoing IOL for oligohydramnios, currently on Day 3 of induction. The risks of surgery were discussed with the patient including but were not limited to: bleeding which may require transfusion or reoperation; infection which may require antibiotics; injury to bowel, bladder, ureters or other surrounding organs; injury to the fetus; need for additional procedures including hysterectomy in the event of a life-threatening hemorrhage; formation of adhesions; placental abnormalities with subsequent pregnancies; incisional problems; thromboembolic phenomenon and other postoperative/anesthesia complications.  The patient concurred with the proposed plan, giving informed written consent for the procedure.   Patient has been NPO since this morning she will remain NPO for procedure. Anesthesia and OR aware. Preoperative prophylactic antibiotics and SCDs ordered on call to the OR.  To OR when ready.   Hildred Laser, MD [place] [facility] at [facility]

## 2022-04-02 LAB — CBC
HCT: 23.8 % — ABNORMAL LOW (ref 36.0–46.0)
Hemoglobin: 8.1 g/dL — ABNORMAL LOW (ref 12.0–15.0)
MCH: 28.4 pg (ref 26.0–34.0)
MCHC: 34 g/dL (ref 30.0–36.0)
MCV: 83.5 fL (ref 80.0–100.0)
Platelets: 278 10*3/uL (ref 150–400)
RBC: 2.85 MIL/uL — ABNORMAL LOW (ref 3.87–5.11)
RDW: 13.8 % (ref 11.5–15.5)
WBC: 15.5 10*3/uL — ABNORMAL HIGH (ref 4.0–10.5)
nRBC: 0 % (ref 0.0–0.2)

## 2022-04-02 NOTE — Lactation Note (Signed)
This note was copied from a baby's chart. Lactation Consultation Note  Patient Name: Carol Carter Today's Date: 04/02/2022 Reason for consult: Initial assessment;Mother's request;Term;Breastfeeding assistance;RN request;Other (Comment) (Baby initially was in SCN. Mom primary C/S.) Age:31 hours  Maternal Data This is mom's 1st  baby, primary C/S for arrest of dilation. Per chart review baby initially in SCN for respiratory distress, slow feeding in newborn, observation for sepsis and mild thrombocytopenia. Baby transferred back to mom's room today.   On initial consult mom requested assistance with breastfeeding. Mom was having difficulty latching baby to the breast. Has patient been taught Hand Expression?: Yes Does the patient have breastfeeding experience prior to this delivery?: No  Feeding Mother's Current Feeding Choice: Breast Milk Provided mom with tips and strategies to maximize position and latch technique. Baby did latch in football hold with multiple audible swallows mom could identify. LATCH Score Latch: Repeated attempts needed to sustain latch, nipple held in mouth throughout feeding, stimulation needed to elicit sucking reflex.  Audible Swallowing: Spontaneous and intermittent  Type of Nipple: Everted at rest and after stimulation  Comfort (Breast/Nipple): Soft / non-tender  Hold (Positioning): Assistance needed to correctly position infant at breast and maintain latch.  LATCH Score: 8   Interventions Interventions: Breast feeding basics reviewed;Assisted with latch;Breast massage;Hand express;Breast compression;Adjust position;Support pillows;Position options;Education  Discharge Pump: Manual;Personal  Consult Status Consult Status: Follow-up Date: 04/03/22 Follow-up type: In-patient  Update provided to care nurse.  Fuller Song 04/02/2022, 7:29 PM

## 2022-04-02 NOTE — Progress Notes (Signed)
Stood pt at bedside. Pt said she felt a little "woozy" after standing for a few mins. States that she "thinks its because she is tired". Foley kept in place at this time due to pt request and nursing judgement.

## 2022-04-02 NOTE — Discharge Instructions (Signed)

## 2022-04-02 NOTE — Progress Notes (Signed)
Postpartum Day # 1: Cesarean Delivery (primary, for arrest of dilation)  Subjective: Patient reports tolerating PO and no problems voiding.  Is ambulating, currently in nursery breastfeeding infant. Notes bleeding is light to moderate. Pain is well controlled.   Objective: Vital signs in last 24 hours: Temp:  [97.6 F (36.4 C)-99.7 F (37.6 C)] 97.6 F (36.4 C) (12/30 1147) Pulse Rate:  [77-132] 77 (12/30 1147) Resp:  [0-40] 20 (12/30 1147) BP: (103-142)/(57-97) 122/83 (12/30 1147) SpO2:  [93 %-99 %] 98 % (12/30 0749)  Physical Exam:  General: alert and no distress Lungs: clear to auscultation bilaterally Breasts: normal appearance, no masses or tenderness Heart: regular rate and rhythm, S1, S2 normal, no murmur, click, rub or gallop Abdomen: soft, non-tender; bowel sounds normal; no masses,  no organomegaly Pelvis: Lochia appropriate, Uterine Fundus firm, Incision: bandage, clean, dry, intact Extremities: DVT Evaluation: Negative Homan's sign. No cords or calf tenderness. No significant calf/ankle edema.  Recent Labs    03/30/22 1858 04/02/22 0324  HGB 10.6* 8.1*  HCT 31.6* 23.8*    Assessment/Plan: Status post Cesarean section. Doing well postoperatively.  Breastfeeding, doing well Regular diet as tolerated Continue PO pain management Contraception: IUD Continue current care. Plan for discharge in 1-2 days depending on infant status (currently in NICU for low sats)   Hildred Laser, MD [place] [facility] at [facility]

## 2022-04-03 ENCOUNTER — Encounter: Payer: Self-pay | Admitting: Obstetrics and Gynecology

## 2022-04-03 MED ORDER — FERROUS SULFATE 325 (65 FE) MG PO TABS: 325.0000 mg | ORAL_TABLET | Freq: Every day | ORAL | 3 refills | Status: AC

## 2022-04-03 NOTE — Anesthesia Post-op Follow-up Note (Signed)
  Anesthesia Pain Follow-up Note  Patient: Carol Carter  Day #: 1  Date of Follow-up: 04/03/2022 Time: 9:46 AM  Last Vitals:  Vitals:   04/03/22 0138 04/03/22 0842  BP: 121/78 105/76  Pulse: (!) 107 92  Resp: 18 18  Temp: 36.7 C   SpO2: 98% 98%    Level of Consciousness: alert  Pain: none   Side Effects:None  Catheter Site Exam:clean, dry, no drainage     Plan: D/C from anesthesia care at surgeon's request  [employer]

## 2022-04-03 NOTE — Anesthesia Postprocedure Evaluation (Signed)
Anesthesia Post Note  Patient: Carol Carter  Procedure(s) Performed: CESAREAN SECTION  Patient location during evaluation: Mother Baby Anesthesia Type: Epidural Level of consciousness: awake and alert Pain management: pain level controlled Vital Signs Assessment: post-procedure vital signs reviewed and stable Respiratory status: spontaneous breathing, nonlabored ventilation and respiratory function stable Cardiovascular status: stable Postop Assessment: no headache, no backache and epidural receding Anesthetic complications: no  No notable events documented.   Last Vitals:  Vitals:   04/03/22 0138 04/03/22 0842  BP: 121/78 105/76  Pulse: (!) 107 92  Resp: 18 18  Temp: 36.7 C   SpO2: 98% 98%    Last Pain:  Vitals:   04/03/22 0825  TempSrc:   PainSc: 0-No pain                 Stephanie Coup

## 2022-04-03 NOTE — Progress Notes (Addendum)
Subjective: Postpartum Day 2: Cesarean Delivery Carol Carter is feeling well overall. She is ambulating, voiding, and tolerating POs without difficulty. Her pain is well-controlled and her bleeding is WNL. Her mood is stable. Breastfeeding is going well.  Objective: Vital signs in last 24 hours: Temp:  [97.8 F (36.6 C)-98 F (36.7 C)] 98 F (36.7 C) (12/31 0138) Pulse Rate:  [92-107] 92 (12/31 0842) Resp:  [18] 18 (12/31 0842) BP: (105-121)/(76-78) 105/76 (12/31 0842) SpO2:  [98 %-100 %] 98 % (12/31 0842)  Physical Exam:  General: alert, cooperative, and appears stated age Lochia: appropriate Uterine Fundus: firm Incision: healing well, honeycomb dressing C/D/I DVT Evaluation: No evidence of DVT seen on physical exam. Mild , non-pitting edema in feet.  Recent Labs    04/02/22 0324  HGB 8.1*  HCT 23.8*    Assessment/Plan: Status post Cesarean section. Doing well postoperatively.  Continue current care Lactation assistance May discharge later this afternoon or tomorrow  Glenetta Borg, CNM 04/03/2022, 12:02 PM

## 2022-04-03 NOTE — Discharge Summary (Signed)
   Postpartum Discharge Summary  Date of Service updated 04/03/22   Patient Name: Carol Carter DOB: 11/07/1990 MRN: [id number]  Date of admission: 03/30/2022 Delivery date:04/01/2022  Delivering provider: CHERRY, ANIKA  Date of discharge: 04/03/2022  Admitting diagnosis: Oligohydramnios [O41.00X0] Intrauterine pregnancy: [redacted]w[redacted]d     Secondary diagnosis:  Principal Problem:   Oligohydramnios Active Problems:   Obesity in pregnancy, antepartum   GBS bacteriuria   Encounter for planned induction of labor  Additional problems: Cesarean, unplanned    Discharge diagnosis: Term Pregnancy Delivered                                              Post partum procedures: None Augmentation: Pitocin, Cytotec, and IP Foley Complications: Arrest of dilation  Hospital course: Induction of Labor With Cesarean Section   31 y.o. yo G1P1001 at [redacted]w[redacted]d was admitted to the hospital 03/30/2022 for induction of labor. Patient had a labor course significant for arrest of dilation. The patient went for cesarean section due to Arrest of Dilation. Delivery details are as follows: Membrane Rupture Time/Date: 4:52 PM ,03/31/2022   Delivery Method:C-Section, Low Transverse  Details of operation can be found in separate operative Note.  Patient had an uncomplicated postpartum course. She is ambulating, tolerating a regular diet, passing flatus, and urinating well.  Patient is discharged home in stable condition on 04/03/22.      Newborn Data: Birth date:04/01/2022  Birth time:7:42 PM  Gender:Female  Living status:Living  Apgars:8 ,8  Weight:3710 g                                Magnesium Sulfate received: No BMZ received: No Rhophylac:N/A MMR:N/A T-DaP:Given prenatally Flu: No Transfusion:No  Physical exam  Vitals:   04/02/22 1147 04/02/22 1600 04/03/22 0138 04/03/22 0842  BP: 122/83 114/78 121/78 105/76  Pulse: 77 100 (!) 107 92  Resp: 20 18 18 18  Temp: 97.6 F (36.4 C) 97.8 F (36.6 C) 98  F (36.7 C)   TempSrc: Axillary Axillary Oral   SpO2:  100% 98% 98%  Weight:      Height:       General: alert, cooperative, and no distress Heart: RRR Lungs: CTAB Abdomen: soft, non-tender, normal BS Lochia: appropriate Uterine Fundus: firm Incision: Healing well with no significant drainage DVT Evaluation: No evidence of DVT seen on physical exam. Labs: Lab Results  Component Value Date   WBC 15.5 (H) 04/02/2022   HGB 8.1 (L) 04/02/2022   HCT 23.8 (L) 04/02/2022   MCV 83.5 04/02/2022   PLT 278 04/02/2022      Latest Ref Rng & Units 05/08/2017    1:15 PM  CMP  Glucose 65 - 99 mg/dL 93   BUN 6 - 20 mg/dL 9   Creatinine 0.44 - 1.00 mg/dL 0.90   Sodium 135 - 145 mmol/L 137   Potassium 3.5 - 5.1 mmol/L 3.5   Chloride 101 - 111 mmol/L 103   CO2 22 - 32 mmol/L 27   Calcium 8.9 - 10.3 mg/dL 9.2   Total Protein 6.5 - 8.1 g/dL 8.6   Total Bilirubin 0.3 - 1.2 mg/dL 0.6   Alkaline Phos 38 - 126 U/L 57   AST 15 - 41 U/L 19   ALT 14 - 54 U/L 12      Edinburgh Score:    04/03/2022    8:25 AM  Edinburgh Postnatal Depression Scale Screening Tool  I have been able to laugh and see the funny side of things. 0  I have looked forward with enjoyment to things. 0  I have blamed myself unnecessarily when things went wrong. 0  I have been anxious or worried for no good reason. 0  I have felt scared or panicky for no good reason. 0  Things have been getting on top of me. 0  I have been so unhappy that I have had difficulty sleeping. 0  I have felt sad or miserable. 0  I have been so unhappy that I have been crying. 0  The thought of harming myself has occurred to me. 0  Edinburgh Postnatal Depression Scale Total 0      After visit meds:  Allergies as of 04/03/2022   No Known Allergies      Medication List     TAKE these medications    multivitamin-prenatal 27-0.8 MG Tabs tablet Take 1 tablet by mouth daily at 12 noon.         Discharge home in stable  condition Infant Feeding: Breast Infant Disposition:home with mother Discharge instruction: per After Visit Summary and Postpartum booklet. Activity: Advance as tolerated. Pelvic rest for 6 weeks.  Diet: routine diet Anticipated Birth Control: IUD Postpartum Appointment:6 weeks Additional Postpartum F/U: Postpartum Depression checkup and Incision check 1 week Future Appointments:No future appointments. Follow up Visit:  Follow-up Information     [place]. Schedule an appointment as soon as possible for a visit in 2 week(s).   Why: Incision check in 1 week Video visit in 2 weeks Office visit in 6 weeks Contact information: [address] [place] [address] [phone]                    04/03/2022 Lurlean Horns, CNM

## 2022-04-03 NOTE — Lactation Note (Signed)
This note was copied from a baby's chart. Lactation Consultation Note  Patient Name: Carol Carter Today's Date: 04/03/2022 Reason for consult: Follow-up assessment;RN request;Breastfeeding assistance;Difficult latch Age:31 hours  Maternal Data Has patient been taught Hand Expression?: Yes Does the patient have breastfeeding experience prior to this delivery?: No  Feeding Mother's Current Feeding Choice: Breast Milk  LATCH Score Latch: Repeated attempts needed to sustain latch, nipple held in mouth throughout feeding, stimulation needed to elicit sucking reflex. (initially had repeated attempts until sustained latch achieved)  Audible Swallowing: Spontaneous and intermittent  Type of Nipple: Everted at rest and after stimulation  Comfort (Breast/Nipple): Soft / non-tender  Hold (Positioning): Assistance needed to correctly position infant at breast and maintain latch.  LATCH Score: 8   Lactation Tools Discussed/Used Tools: Other (comment) (brief attempt with shield then removed)  Interventions Interventions: Breast feeding basics reviewed;Assisted with latch;Breast massage;Support pillows;Education;Infant Driven Feeding Algorithm education  Mom reports cluster feeding through the night and that she is committed to breastfeeding after her birth experience did not go as planned. States that baby does not latch well when being observed but that effective latch has been achieved. In football hold on left breast with pillow support, latch attempted for 10-15 minutes with stimulation. Mom had good "C" hold of breast and can hand express. Baby initially did not open wide and had shallow grasps with top and bottom lip curled in. Nipple shield applied briefly with no change in latch noted. LC stepped away to wash and assemble pump, and good latch with rhythmic suck and audible swallows obtained during that time. Sustained latch for 20+ minutes. Instructed to offer second side at each  feeding. Engorgement signs and treatment, input of 8-12 feedings in 24 hours, expected output of 3 bowel movements a day by day 3 and 6-8 voids a day by day 6, pump recommendations when baby does not latch, milk storage guidelines, and self-care discussed. Case discussed with NP. LC services contact information given. Follow up as needed.  Discharge Discharge Education: Engorgement and breast care;Warning signs for feeding baby Pump: DEBP;Personal  Consult Status Consult Status: PRN    Matt Holmes 04/03/2022, 2:03 PM

## 2022-04-03 NOTE — Progress Notes (Signed)
D/c to car via w/c   Verb understanding of d/c instructions

## 2022-04-04 DIAGNOSIS — Z419 Encounter for procedure for purposes other than remedying health state, unspecified: Secondary | ICD-10-CM | POA: Diagnosis not present

## 2022-04-08 DIAGNOSIS — R69 Illness, unspecified: Secondary | ICD-10-CM | POA: Diagnosis not present

## 2022-04-12 ENCOUNTER — Telehealth: Payer: Self-pay

## 2022-04-12 NOTE — Telephone Encounter (Signed)
Pt calling to schedule a one week check up appt.  [phone]

## 2022-04-13 NOTE — Telephone Encounter (Signed)
Patient is scheduled for 1/12 with [facility] for incision check and 2/7 for 6 weeks PP with Dr. Marcelline Mates.

## 2022-04-15 ENCOUNTER — Ambulatory Visit (INDEPENDENT_AMBULATORY_CARE_PROVIDER_SITE_OTHER): Payer: 59 | Admitting: Advanced Practice Midwife

## 2022-04-15 ENCOUNTER — Encounter: Payer: Self-pay | Admitting: Advanced Practice Midwife

## 2022-04-15 DIAGNOSIS — Z87898 Personal history of other specified conditions: Secondary | ICD-10-CM

## 2022-04-15 NOTE — Progress Notes (Signed)
Patient ID: Carol Carter, female   DOB: 08/18/1990, 32 y.o.   MRN: [id number]  Reason for Consult: Wound Check (No concerns)   Subjective:  HPI:   Carol Carter is a 32 y.o. female being seen for 2 week incision check s/p primary cesarean section. She denies concerns although she hasn't been able to see the area and has had her mother look. She is wondering about remaining steri strips.  She has some mild incisional pain. She is still bleeding- generally decreasing.   Past Medical History:  Diagnosis Date   Asthma    teenager   Dysmenorrhea    History of kidney stones    Left ovarian cyst 08/2019   Sleep apnea 2017   does not wear cpap   Family History  Problem Relation Age of Onset   Thyroid disease Mother    Hypertension Brother    Liver disease Brother    Colon cancer Maternal Aunt 55   Diabetes Father    Hypertension Father    Heart Problems Father    Past Surgical History:  Procedure Laterality Date   CESAREAN SECTION  04/01/2022   Procedure: CESAREAN SECTION;  Surgeon: Rubie Maid, MD;  Location: [facility] ORS;  Service: Obstetrics;;   CYSTOSCOPY  08/08/2019   LAPAROSCOPIC OVARIAN CYSTECTOMY Left 08/15/2019   Procedure: LAPAROSCOPIC OVARIAN CYSTECTOMY;  Surgeon: Gae Dry, MD;  Location: [facility] ORS;  Service: Gynecology;  Laterality: Left;   WISDOM TOOTH EXTRACTION  07/17/2016    Short Social History:  Social History   Tobacco Use   Smoking status: Former    Packs/day: 0.10    Types: Cigarettes    Quit date: 07/28/2021    Years since quitting: 0.7   Smokeless tobacco: Never  Substance Use Topics   Alcohol use: Not Currently    Comment: Occasional    No Known Allergies  Current Outpatient Medications  Medication Sig Dispense Refill   ferrous sulfate 325 (65 FE) MG tablet Take 1 tablet (325 mg total) by mouth daily with breakfast. 30 tablet 3   Prenatal Vit-Fe Fumarate-FA (MULTIVITAMIN-PRENATAL) 27-0.8 MG TABS tablet Take 1 tablet by mouth  daily at 12 noon.     No current facility-administered medications for this visit.   Review of Systems  Constitutional:  Negative for chills and fever.  HENT:  Negative for congestion, ear discharge, ear pain, hearing loss, sinus pain and sore throat.   Eyes:  Negative for blurred vision and double vision.  Respiratory:  Negative for cough, shortness of breath and wheezing.   Cardiovascular:  Negative for chest pain, palpitations and leg swelling.  Gastrointestinal:  Negative for abdominal pain, blood in stool, constipation, diarrhea, heartburn, melena, nausea and vomiting.  Genitourinary:  Negative for dysuria, flank pain, frequency, hematuria and urgency.  Musculoskeletal:  Negative for back pain, joint pain and myalgias.  Skin:  Negative for itching and rash.  Neurological:  Negative for dizziness, tingling, tremors, sensory change, speech change, focal weakness, seizures, loss of consciousness, weakness and headaches.  Endo/Heme/Allergies:  Negative for environmental allergies. Does not bruise/bleed easily.  Psychiatric/Behavioral:  Negative for depression, hallucinations, memory loss, substance abuse and suicidal ideas. The patient is not nervous/anxious and does not have insomnia.        Objective:  Objective   Vitals:   04/15/22 0839  BP: 129/85  Pulse: 90  Weight: 192 lb (87.1 kg)  Height: 5\' 3"  (1.6 m)   Body mass index is 34.01 kg/m. Constitutional: Well nourished, well  developed female in no acute distress.  HEENT: normal Skin: Warm and dry.  Cardiovascular: Regular rate and rhythm.   Extremity:  no edema   Respiratory: Clear to auscultation bilateral. Normal respiratory effort Abdomen: 3 persistent steri strips- removed. Incision is well approximated and healed. Psych: Alert and Oriented x3. No memory deficits. Normal mood and affect.    Assessment/Plan:     32 y.o. G1 P1 female, normal post op incision check  Return as needed and in 4 weeks for 6 week  postpartum visit   [facility] 04/15/2022, 10:10 AM

## 2022-04-19 ENCOUNTER — Telehealth: Payer: Self-pay

## 2022-04-19 NOTE — Telephone Encounter (Signed)
[facility]- Discharge Call Backs-Pt did not answer the phone but I let a message about the following. 1-Do you have any questions or concerns about yourself as you heal? 2-How was your stay at the hospital? 3-How did our team work together to care for you? You should be receiving a survey in the mail soon.   We would really appreciate it if you could fill that out for [place] and return it in the mail.  We value the feedback to make improvements and continue the great work we do.   If you have any questions please feel free to call me back at [phone]

## 2022-05-05 DIAGNOSIS — Z419 Encounter for procedure for purposes other than remedying health state, unspecified: Secondary | ICD-10-CM | POA: Diagnosis not present

## 2022-05-11 ENCOUNTER — Encounter: Payer: Self-pay | Admitting: Obstetrics and Gynecology

## 2022-05-11 ENCOUNTER — Ambulatory Visit (INDEPENDENT_AMBULATORY_CARE_PROVIDER_SITE_OTHER): Payer: 59 | Admitting: Obstetrics and Gynecology

## 2022-05-11 DIAGNOSIS — Z30430 Encounter for insertion of intrauterine contraceptive device: Secondary | ICD-10-CM

## 2022-05-11 DIAGNOSIS — O9081 Anemia of the puerperium: Secondary | ICD-10-CM

## 2022-05-11 MED ORDER — LEVONORGESTREL 20 MCG/DAY IU IUD
1.0000 | INTRAUTERINE_SYSTEM | Freq: Once | INTRAUTERINE | Status: AC
  Administered 2022-05-11: 1 via INTRAUTERINE

## 2022-05-11 NOTE — Patient Instructions (Addendum)
Intrauterine Device Information An intrauterine device (IUD) is a medical device that is inserted into the uterus to prevent pregnancy. It is a small, T-shaped device that has one or two nylon strings hanging down from it. The strings hang out of the lower part of the uterus (cervix) to allow for future IUD removal. There are two types of IUDs: Hormone IUD. This type of IUD is made of plastic and contains the hormone progestin (synthetic progesterone). A hormone IUD may last 3-5 years. Copper IUD. This type of IUD has copper wire wrapped around it. A copper IUD may last up to 10 years. How is an IUD inserted? An IUD is inserted through the vagina, through the cervix, and into the uterus with a minor medical procedure. The procedure for IUD insertion may vary among health care providers and hospitals. How does an IUD work? Synthetic progesterone in a hormonal IUD prevents pregnancy by: Thickening cervical mucus to prevent sperm from entering the uterus. Thinning the uterine lining to prevent a fertilized egg from being implanted there. Copper in a copper IUD prevents pregnancy by making the uterus and fallopian tubes produce a fluid that kills sperm. What are the advantages of an IUD? Advantages of either type of IUD An IUD: Is highly effective in preventing pregnancy. Is reversible. You can become pregnant shortly after the IUD is removed. Is low-maintenance and can stay in place for a long time. Has no estrogen-related side effects. Can be used when breastfeeding. Is not associated with weight gain. Can be inserted right after childbirth, an abortion, or a miscarriage. Advantages of a hormone IUD If it is inserted within 7 days of your period starting, it works right after it has been inserted. If the hormone IUD is inserted at any other time in your cycle, you will need to use a backup method of birth control for 7 days after insertion. It can make menstrual periods lighter or stop  completely. It can reduce menstrual cramping and other discomforts from menstrual periods. It can be used for 3-5 years, depending on which IUD you have. Advantages of a copper IUD It works right after it is inserted. It can be used as a form of emergency birth control if it is inserted within 5 days after having unprotected sex. It does not interfere with your body's natural hormones. It can be used for up to 10 years. What are the disadvantages of an IUD? An IUD may cause irregular menstrual bleeding for a period of time after insertion. It is common to have pain during insertion and have cramping and vaginal bleeding after insertion. An IUD may cut the uterus (uterine perforation) when it is inserted. This is rare. Pelvic inflammatory disease (PID) may happen after insertion of an IUD. PID is an infection in the uterus and fallopian tubes. The IUD does not cause the infection. The infection is usually from an unknown sexually transmitted infection (STI). This is rare, and it usually happens during the first 20 days after the IUD is inserted. A copper IUD can make your menstrual flow heavier and more painful. IUDs cannot prevent sexually transmitted infections (STIs). How is an IUD removed?  You will lie on your back with your knees bent and your feet in footrests (stirrups). A device will be inserted into your vagina to spread apart the vaginal walls (speculum). This will allow your health care provider to see the strings attached to the IUD. Your health care provider will use a small instrument (forceps) to   grasp the IUD strings and will pull firmly until the IUD is removed. You may have some discomfort when the IUD is removed. Your health care provider may recommend taking over-the-counter pain relievers, such as ibuprofen, before the procedure. You may also have minor spotting for a few days after the procedure. The procedure for IUD removal may vary among health care providers and  hospitals. Is an IUD right for me? If you are interested in an IUD, discuss it with your health care provider. He or she will make sure you are a good candidate for an IUD and will let you know more about the advantages, disadvantage, and possible side effects. This will allow you to make a decision about the device. Summary An intrauterine device (IUD) is a medical device that is inserted in the uterus to prevent pregnancy. It is a small, T-shaped device that has one or two nylon strings hanging down from it. A hormone IUD contains the hormone progestin (synthetic progesterone). A copper IUD has copper wire wrapped around it. Synthetic progesterone in a hormone IUD prevents pregnancy by thickening cervical mucus and thinning the walls of the uterus. Copper in a copper IUD prevents pregnancy by making the uterus and fallopian tubes produce a fluid that kills sperm. A hormone IUD can be left in place for 3-5 years. A copper IUD can be left in place for up to 10 years. An IUD is inserted and removed by a health care provider. You may feel some pain during insertion and removal. Your health care provider may recommend taking over-the-counter pain medicine, such as ibuprofen, before an IUD procedure. This information is not intended to replace advice given to you by your health care provider. Make sure you discuss any questions you have with your health care provider. Document Revised: 10/02/2019 Document Reviewed: 10/02/2019 Elsevier Patient Education  [place]. IUD PLACEMENT POST-PROCEDURE INSTRUCTIONS  You may take Ibuprofen, Aleve or Tylenol for pain if needed.  Cramping should resolve within in 24 hours.  You may have a small amount of spotting.  You should wear a mini pad for the next few days.  You may have intercourse after 24 hours.  If you using this for birth control, it is effective immediately.  You need to call if you have any pelvic pain, fever, heavy bleeding or foul  smelling vaginal discharge.  Irregular bleeding is common the first several months after having an IUD placed. You do not need to call for this reason unless you are concerned.  Shower or bathe as normal  You should have a follow-up appointment in 4-8 weeks for a re-check to make sure you are not having any problems.WHAT IS THE FOURTH TRIMESTER?  The fourth trimester is the 12 weeks following the birth of a newborn. In these first few months of your baby's life, it's an important time to create a bond with them. It's also a period of adjustment as your baby adapts to life outside of the womb.  Why Is It Called the Fourth Trimester? The first trimester of pregnancy is from 1 to 14 weeks, the second trimester is from 14 to 28 weeks, and the third trimester is from 28 weeks until your baby is born. The fourth trimester includes the first weeks after you've given birth. This is the time when both you and your baby adjust to life after delivery.  The term fourth trimester was coined in 2002 by pediatrician Sunday Spillers, MD. He claimed that you should try  to recreate the kind of environment that your baby had while still in the womb. There are several ways that you can do this.  Skin-to-skin contact. To help recreate what your baby's life in the womb was like, you and your partner can share skin-to-skin contact with your baby. This way, your baby can feel your heartbeat and the warmth of your skin, which are both comforting and familiar to them. You can also get this contact with your baby during breastfeeding.   Swaddling and moving. While in the womb, your baby was in a small, confined space. You can recreate this sense of safety and security for them by swaddling. Studies show that babies may sleep better when swaddled. You can also get this same effect by carrying your baby in a sling close to your body.  Movement is also very comforting for your baby. Since they are used to the movement of your body  from being in the womb, movement during the fourth trimester for infants is familiar to them.   What to Expect in the Fourth Trimester The fourth trimester is an important time for newborns. Typically, they have their first pediatric checkup during the first week after they're born. This doctor visit is important to monitor their health and development. Your baby's pediatrician will continue to closely monitor their well-being throughout the following weeks.  In these first months after birth, your baby is just learning how to use their senses to process the world around them. They are totally dependent on you to care for them and to understand their needs. It's during this time that your baby will likely learn how to start doing some things independently, like:  Making noises to communicate Holding their head up without help Keeping their attention on objects and follow them Using their muscles Smiling  Your baby has a lot of adjusting to do outside of the womb. Their brains are taking in new sensations like tastes, smells, and sounds. During this time, it's important to follow their cues when it comes to sleeping, crying, and feeding.  In the fourth trimester, babies need 14 to 17 hours of sleep each day, even though their sleep schedule won't be predictable. After the fourth trimester, your baby may start to sleep through the night. They will also need to be fed every 2 to 3 hours because their little body is growing so quickly.  When it comes to crying, you can try swaddling or rocking your baby to create that familiar environment for them. Other times, your baby will cry because they are hungry, need a diaper change, or simply want to be held by you.  Tips for Mom For moms, you'll notice that the fourth trimester is a period of great change for you, too. Before delivery, the mother's health is monitored quite closely. After birth, the focus usually shifts from you to the health of your  baby. But it's just as important that new mothers get good postpartum care, too.   Your body is adjusting from pregnancy to healing from birth. Some common side effects that you might feel from childbirth include:  Changes in hormones Swelling Postpartum bleeding A general feeling of discomfort  These effects, plus the lack of sleep, can cause you to feel stress or anxiety, or to have mood swings. It's important to talk to your doctor about how you feel after giving birth and to not be afraid to ask for help when you need it.  To help yourself stay  well during the 4th trimester, make sure you:  Eat a nutritious, balanced diet. Try to get rest when your schedule and your baby's schedule allow. Check in with your doctor or midwife if you experience pain or feel unwell. Talk to your doctor if you feel like you might have symptoms of postpartum depression.  [email or web address]

## 2022-05-11 NOTE — Progress Notes (Signed)
OBSTETRICS POSTPARTUM CLINIC PROGRESS NOTE  Subjective:     Carol Carter is a 32 y.o. G52P1001 female who presents for a postpartum visit. She is 6 weeks postpartum following a low cervical transverse Cesarean section (performed for arrest of dilation). I have fully reviewed the prenatal and intrapartum course. The delivery was at 40.1 gestational weeks.  Anesthesia: epidural. Postpartum course has been going well. Baby's course has been good. Baby is feeding by both breast and bottle - Similac Sensitive RS. Bleeding: patient has not not resumed menses, with No LMP recorded.. Bowel function is abnormal: she has been having some constipation . Bladder function is normal. Patient is not sexually active. Contraception method desired is IUD. Postpartum depression screening: negative.  EDPS score is 0.    The following portions of the patient's history were reviewed and updated as appropriate: allergies, current medications, past family history, past medical history, past social history, past surgical history, and problem list.  Review of Systems Pertinent items noted in HPI and remainder of comprehensive ROS otherwise negative.   Objective:    BP (!) 128/92   Pulse 85   Resp 16   Ht 5\' 3"  (1.6 m)   Wt 186 lb 11.2 oz (84.7 kg)   Breastfeeding Yes   BMI 33.07 kg/m   General:  alert and no distress   Breasts:  inspection negative, no nipple discharge or bleeding, no masses or nodularity palpable  Lungs: clear to auscultation bilaterally  Heart:  regular rate and rhythm, S1, S2 normal, no murmur, click, rub or gallop  Abdomen: soft, non-tender; bowel sounds normal; no masses,  no organomegaly.  Well healed Pfannenstiel incision   Vulva:  normal  Vagina: normal vagina, no discharge, exudate, lesion, or erythema  Cervix:  no cervical motion tenderness and no lesions  Corpus: normal size, contour, position, consistency, mobility, non-tender  Adnexa:  normal adnexa and no mass, fullness,  tenderness  Rectal Exam: Not performed.         Labs:  Lab Results  Component Value Date   HGB 8.1 (L) 04/02/2022    Assessment:   1. Postpartum care following cesarean delivery   2. Lactating mother   3. Encounter for insertion of mirena IUD   4. Postpartum anemia      Plan:   1. Doing well postpartum. Lactation supply lower than demand, supplementing with formula.  2. Contraception: IUD, see insertion note below.  3. Patient has been  taking iron for postpartum anemia 4. Follow up in: 4 weeks for IUD check    IUD Insertion Procedure Note Patient identified, informed consent performed, consent signed.   Discussed risks of irregular bleeding, cramping, infection, malpositioning or misplacement of the IUD outside the uterus which may require further procedure such as laparoscopy. Also discussed >99% contraception efficacy, increased risk of ectopic pregnancy with failure of method.   Emphasized that this did not protect against STIs, condoms recommended during all sexual encounters. Time out was performed.  Urine pregnancy test negative.  Speculum placed in the vagina.  Cervix visualized.  Cleaned with Betadine x 2.  Grasped anteriorly with a single tooth tenaculum.  Uterus sounded to 9 cm.  Mirena IUD placed per manufacturer's recommendations.  Strings trimmed to 3 cm. Tenaculum was removed, good hemostasis noted.  Patient tolerated procedure well.   Patient was given post-procedure instructions.  She was advised to have backup contraception for one week.  Patient was also asked to check IUD strings periodically and follow up  in 4 weeks for IUD check.   Lot: [id number] Exp: 03/2024   Rubie Maid, MD [place]

## 2022-05-13 ENCOUNTER — Encounter: Payer: Self-pay | Admitting: Obstetrics and Gynecology

## 2022-06-03 DIAGNOSIS — Z419 Encounter for procedure for purposes other than remedying health state, unspecified: Secondary | ICD-10-CM | POA: Diagnosis not present

## 2022-06-07 NOTE — Progress Notes (Unsigned)
    GYNECOLOGY PROGRESS NOTE  Subjective:    Patient ID: Carol Carter, female    DOB: February 11, 1991, 32 y.o.   MRN: [id number]  HPI  Patient is a 32 y.o. G33P1001 female who presents for  IUD CHECK  The following portions of the patient's history were reviewed and updated as appropriate: allergies, current medications, past family history, past medical history, past social history, past surgical history, and problem list.  Review of Systems Pertinent items are noted in HPI.   Objective:   currently breastfeeding. There is no height or weight on file to calculate BMI. General appearance: alert, cooperative, and no distress Abdomen: {abdominal exam:16834} Pelvic: {pelvic exam:16852::"cervix normal in appearance","external genitalia normal","no adnexal masses or tenderness","no cervical motion tenderness","rectovaginal septum normal","uterus normal size, shape, and consistency","vagina normal without discharge"} Extremities: {extremity exam:5109} Neurologic: {neuro exam:17854}   Assessment:   No diagnosis found.   Plan:   There are no diagnoses linked to this encounter.

## 2022-06-08 ENCOUNTER — Ambulatory Visit: Payer: 59 | Admitting: Obstetrics and Gynecology

## 2022-06-08 ENCOUNTER — Encounter: Payer: Self-pay | Admitting: Obstetrics and Gynecology

## 2022-06-08 DIAGNOSIS — Z30431 Encounter for routine checking of intrauterine contraceptive device: Secondary | ICD-10-CM

## 2022-06-30 NOTE — Patient Instructions (Signed)
Levonorgestrel Intrauterine Device What is this medication? LEVONORGESTREL (LEE voe nor jes trel) prevents ovulation and pregnancy. It may also be used to treat heavy periods. It belongs to a group of medications called contraceptives. This medication is a progestin hormone. This medicine may be used for other purposes; ask your health care provider or pharmacist if you have questions. COMMON BRAND NAME(S): Minette Headland What should I tell my care team before I take this medication? They need to know if you have any of these conditions: Abnormal Pap smear Cancer of the breast, uterus, or cervix Diabetes Endometritis Genital or pelvic infection now or in the past Have more than one sexual partner or your partner has more than one partner Heart disease History of an ectopic or tubal pregnancy Immune system problems IUD in place Liver disease or tumor Problems with blood clots or take blood-thinners Seizures Use intravenous drugs Uterus of unusual shape Vaginal bleeding that has not been explained An unusual or allergic reaction to levonorgestrel, other hormones, silicone, or polyethylene, medications, foods, dyes, or preservatives Pregnant or trying to get pregnant Breast-feeding How should I use this medication? This device is placed inside the uterus by your care team. A patient package insert for the product will be given each time it is inserted. Be sure to read this information carefully each time. The sheet may change often. Talk to your care team about use of this medication in children. Special care may be needed. Overdosage: If you think you have taken too much of this medicine contact a poison control center or emergency room at once. NOTE: This medicine is only for you. Do not share this medicine with others. What if I miss a dose? This does not apply. Depending on the brand of device you have inserted, the device will need to be replaced every 3 to 8 years  if you wish to continue using this type of birth control. What may interact with this medication? Interactions are not expected. Tell your care team about all the medications you take. This list may not describe all possible interactions. Give your health care provider a list of all the medicines, herbs, non-prescription drugs, or dietary supplements you use. Also tell them if you smoke, drink alcohol, or use illegal drugs. Some items may interact with your medicine. What should I watch for while using this medication? Visit your care team for regular check-ups. Tell your care team if you or your partner becomes HIV positive or gets a sexually transmitted disease. Using this medication does not protect you or your partner against HIV or other sexually transmitted infections (STIs). You can check the placement of the IUD yourself by reaching up to the top of your vagina with clean fingers to feel the threads. Do not pull on the threads. It is a good habit to check placement after each menstrual period. Call your care team right away if you feel more of the IUD than just the threads or if you cannot feel the threads at all. The IUD may come out by itself. You may become pregnant if the device comes out. If you notice that the IUD has come out use a backup birth control method like condoms and call your care team. Using tampons will not change the position of the IUD and are okay to use during your period. This IUD can be safely scanned with magnetic resonance imaging (MRI) only under specific conditions. Before you have an MRI, tell your care team that  you have an IUD in place, and which type of IUD you have in place. What side effects may I notice from receiving this medication? Side effects that you should report to your care team as soon as possible: Allergic reactions--skin rash, itching, hives, swelling of the face, lips, tongue, or throat Blood clot--pain, swelling, or warmth in the leg, shortness  of breath, chest pain Gallbladder problems--severe stomach pain, nausea, vomiting, fever Increase in blood pressure Liver injury--right upper belly pain, loss of appetite, nausea, light-colored stool, dark yellow or brown urine, yellowing skin or eyes, unusual weakness or fatigue New or worsening migraines or headaches Pelvic inflammatory disease (PID)--fever, abdominal pain, pelvic pain, pain or trouble passing urine, spotting, bleeding during or after sex Stroke--sudden numbness or weakness of the face, arm, or leg, trouble speaking, confusion, trouble walking, loss of balance or coordination, dizziness, severe headache, change in vision Unusual vaginal discharge, itching, or odor Vaginal pain, irritation, or sores Worsening mood, feelings of depression Side effects that usually do not require medical attention (report to your care team if they continue or are bothersome): Breast pain or tenderness Dark patches of skin on the face or other sun-exposed areas Irregular menstrual cycles or spotting Nausea Weight gain This list may not describe all possible side effects. Call your doctor for medical advice about side effects. You may report side effects to FDA at 1-800-FDA-1088. Where should I keep my medication? This does not apply. NOTE: This sheet is a summary. It may not cover all possible information. If you have questions about this medicine, talk to your doctor, pharmacist, or health care provider.  2023 Elsevier/Gold Standard (2007-05-12 00:00:00)

## 2022-06-30 NOTE — Progress Notes (Signed)
    GYNECOLOGY OFFICE ENCOUNTER NOTE  History:  32 y.o. G1P1001 here today for today for IUD string check; Mirena  IUD was placed  05/11/22. No complaints about the IUD, no concerning side effects.  Is beginning to note some spotting starting this morning.   The following portions of the patient's history were reviewed and updated as appropriate: allergies, current medications, past family history, past medical history, past social history, past surgical history and problem list. Last pap smear on 09/01/2020 was normal, negative HRHPV.  Review of Systems:  Pertinent items are noted in HPI.  Objective:  Physical Exam Blood pressure 111/64, pulse 80, height 5\' 3"  (1.6 m), weight 184 lb (83.5 kg), not currently breastfeeding.  CONSTITUTIONAL: Well-developed, well-nourished female in no acute distress.  NEUROLOGIC: Alert and oriented to person, place, and time. Normal reflexes, muscle tone coordination.  ABDOMEN: Soft, no distention noted.   PELVIC: Normal appearing external genitalia; normal appearing vaginal mucosa and cervix.  IUD strings initially not visualized, Kelly clamp and IUD retrieval device utilized to bring strings from endocervical canal.  Strings brought out, ~ 3 cm in length outside cervix.  EXTREMITIES: Non-tender, no edema or cyanosis  Assessment & Plan:  - Patient to keep IUD in place for up to 8 years; can come in for removal if she desires pregnancy earlier or for any concerning side effects. - Return to clinic for any scheduled appointments or for any gynecologic concerns as needed.  Due for annual exam in 4-6 months.     Rubie Maid, MD. [facility] at [facility]

## 2022-07-04 DIAGNOSIS — Z419 Encounter for procedure for purposes other than remedying health state, unspecified: Secondary | ICD-10-CM | POA: Diagnosis not present

## 2022-07-05 ENCOUNTER — Ambulatory Visit (INDEPENDENT_AMBULATORY_CARE_PROVIDER_SITE_OTHER): Payer: 59 | Admitting: Obstetrics and Gynecology

## 2022-07-05 ENCOUNTER — Encounter: Payer: Self-pay | Admitting: Obstetrics and Gynecology

## 2022-07-05 VITALS — BP 111/64 | HR 80 | Ht 63.0 in | Wt 184.0 lb

## 2022-07-05 DIAGNOSIS — Z30431 Encounter for routine checking of intrauterine contraceptive device: Secondary | ICD-10-CM

## 2022-07-05 DIAGNOSIS — Z3049 Encounter for surveillance of other contraceptives: Secondary | ICD-10-CM

## 2022-08-03 DIAGNOSIS — Z419 Encounter for procedure for purposes other than remedying health state, unspecified: Secondary | ICD-10-CM | POA: Diagnosis not present

## 2022-09-03 DIAGNOSIS — Z419 Encounter for procedure for purposes other than remedying health state, unspecified: Secondary | ICD-10-CM | POA: Diagnosis not present

## 2022-09-13 ENCOUNTER — Telehealth: Payer: Self-pay

## 2022-09-13 NOTE — Telephone Encounter (Signed)
Pt calling; is having an issue with her c/s scar.  [phone]  Pt delivered 04/01/2022; states one area of her c/s is sore, swollen, and has a bump on the scar itself; doesn't look like there is fluid in it, may be skin. Adv to let me type this up and will send it to [facility].

## 2022-09-13 NOTE — Telephone Encounter (Signed)
Please see if patient can send a picture of the area through Mychart.  It is unusual for a scar to develop issues this far out from her C-section.

## 2022-09-14 ENCOUNTER — Encounter: Payer: Self-pay | Admitting: Obstetrics and Gynecology

## 2022-09-14 MED ORDER — CEPHALEXIN 500 MG PO CAPS: 500.0000 mg | ORAL_CAPSULE | Freq: Three times a day (TID) | ORAL | 0 refills | Status: DC

## 2022-09-14 NOTE — Telephone Encounter (Signed)
Patient called. She will send picture as soon as she can.

## 2022-09-21 ENCOUNTER — Encounter: Payer: Self-pay | Admitting: Obstetrics and Gynecology

## 2022-09-22 ENCOUNTER — Encounter: Payer: Self-pay | Admitting: Licensed Practical Nurse

## 2022-09-22 ENCOUNTER — Ambulatory Visit (INDEPENDENT_AMBULATORY_CARE_PROVIDER_SITE_OTHER): Payer: 59 | Admitting: Licensed Practical Nurse

## 2022-09-22 VITALS — BP 128/85 | HR 70 | Wt 183.5 lb

## 2022-09-22 DIAGNOSIS — Z30432 Encounter for removal of intrauterine contraceptive device: Secondary | ICD-10-CM | POA: Diagnosis not present

## 2022-09-22 DIAGNOSIS — Z30017 Encounter for initial prescription of implantable subdermal contraceptive: Secondary | ICD-10-CM | POA: Diagnosis not present

## 2022-09-22 MED ORDER — ETONOGESTREL 68 MG ~~LOC~~ IMPL
68.0000 mg | DRUG_IMPLANT | Freq: Once | SUBCUTANEOUS | Status: AC
  Administered 2022-09-22: 68 mg via SUBCUTANEOUS

## 2022-09-22 NOTE — Progress Notes (Signed)
GYNECOLOGY OFFICE PROCEDURE NOTE  Carol Carter is a 32 y.o. G1P1001 here for Liletta IUD removal. No GYN concerns.  Last pap smear was on 08/2020 and was normal.  She had an IUD inserted in April, she has been experiencing intense cramping that is causing her to ":double over" in [pain, Tylenol, Motrin and heat are not helping much. She had some cramping a while ago, the intense cramping has bene happening for a few days.   All options reviewed, including watchful waiting and pelvic US and other forms of contraception. Orian would like the IUD removed and Nexplanon inserted today.   IUD Removal  Patient identified, informed consent performed, consent signed.  Patient was in the dorsal lithotomy position, normal external genitalia was noted.  A speculum was placed in the patient's vagina, normal discharge was noted, no lesions. The cervix was visualized, no lesions, no abnormal discharge.  The strings of the IUD were grasped and pulled using ring forceps. The IUD was removed in its entirety.  Patient tolerated the procedure well.    Patient will use Nexplanon for contraception/  Routine preventative health maintenance measures emphasized. Ellwood Sayers, CNM    GYNECOLOGY PROCEDURE NOTE  Patient is a 32 y.o. G1P1001 presenting for Nexplanon insertion as her desires means of contraception.  She provided informed consent, signed copy in the chart, time out was performed. Pregnancy test was not done, IUD removed today, with self reported LMP of No LMP recorded. (Menstrual status: IUD).  She understands that Nexplanon is a progesterone only therapy, and that patients often patients have irregular and unpredictable vaginal bleeding or amenorrhea. She understands that other side effects are possible related to systemic progesterone, including but not limited to, headaches, breast tenderness, nausea, and irritability. While effective at preventing pregnancy long acting reversible  contraceptives do not prevent transmission of sexually transmitted diseases and use of barrier methods for this purpose was discussed. The placement procedure for Nexplanon was reviewed with the patient in detail including risks of nerve injury, infection, bleeding and injury to other muscles or tendons. She understands that the Nexplanon implant is good for 3 years and needs to be removed at the end of that time.  She understands that Nexplanon is an extremely effective option for contraception, with failure rate of <1%. This information is reviewed today and all questions were answered. Informed consent was obtained, both verbally and written.   The patient is healthy and has no contraindications to Implanon use. Urine pregnancy test was performed today and was negative.  Procedure Appropriate time out taken.  Patient placed in dorsal supine with left arm above head, elbow flexed at 90 degrees, arm resting on examination table.  The bicipital grove was palpated and site 8-10cm proximal to the medial epicondyle was indentified . The insertion site was prepped with a two betadine swabs and then injected with 2 cc of 1% lidocaine without epinephrine.  Nexplanon removed form sterile blister packaging,  Device confirmed in needle, before inserting full length of needle, tenting up the skin as the needle was advance.  The drug eluting rod was then deployed by pulling back the slider per the manufactures recommendation.  The implant was palpable by the clinician as well as the patient.  The insertion site covered dressed with a band aid before applying  a kerlex bandage pressure dressing..Minimal blood loss was noted during the procedure.  The patientt tolerated the procedure well.   She was instructed to wear the bandage  for 24 hours, call with any signs of infection.  She was given the Implanon card and instructed to have the rod removed in 3 years.  Carie Caddy, CNM  [facility]  09/22/22   1:00 PM

## 2022-10-03 DIAGNOSIS — Z419 Encounter for procedure for purposes other than remedying health state, unspecified: Secondary | ICD-10-CM | POA: Diagnosis not present

## 2022-11-03 DIAGNOSIS — Z419 Encounter for procedure for purposes other than remedying health state, unspecified: Secondary | ICD-10-CM | POA: Diagnosis not present

## 2022-12-04 DIAGNOSIS — Z419 Encounter for procedure for purposes other than remedying health state, unspecified: Secondary | ICD-10-CM | POA: Diagnosis not present

## 2023-01-03 DIAGNOSIS — Z419 Encounter for procedure for purposes other than remedying health state, unspecified: Secondary | ICD-10-CM | POA: Diagnosis not present

## 2023-02-03 DIAGNOSIS — Z419 Encounter for procedure for purposes other than remedying health state, unspecified: Secondary | ICD-10-CM | POA: Diagnosis not present

## 2023-03-05 DIAGNOSIS — Z419 Encounter for procedure for purposes other than remedying health state, unspecified: Secondary | ICD-10-CM | POA: Diagnosis not present

## 2023-05-02 ENCOUNTER — Encounter: Payer: Self-pay | Admitting: Obstetrics and Gynecology

## 2023-05-02 ENCOUNTER — Ambulatory Visit (INDEPENDENT_AMBULATORY_CARE_PROVIDER_SITE_OTHER): Payer: 59 | Admitting: Obstetrics and Gynecology

## 2023-05-02 VITALS — BP 105/73 | HR 80 | Ht 63.0 in | Wt 180.8 lb

## 2023-05-02 DIAGNOSIS — N750 Cyst of Bartholin's gland: Secondary | ICD-10-CM

## 2023-05-02 MED ORDER — DOXYCYCLINE HYCLATE 100 MG PO CAPS: 100.0000 mg | ORAL_CAPSULE | Freq: Two times a day (BID) | ORAL | 0 refills | Status: AC

## 2023-05-02 NOTE — Progress Notes (Signed)
    GYNECOLOGY PROGRESS NOTE  Subjective:    Patient ID: Carol Carter, female    DOB: Jan 17, 1991, 33 y.o.   MRN: [id number]  HPI  Patient is a 33 y.o. G41P1001 female who presents for evaluation of vaginal cyst. She was evaluated by a Telehealth MD 2 days ago due to cyst rupturing with bloody brownish discharge.  Noted the cyst the day prior.  Was told that it may have been a Bartholin's cyst.  She denies having any pain, just cramping and mild back pain. Bleeding and discharges has now resolved.   The following portions of the patient's history were reviewed and updated as appropriate: allergies, current medications, past family history, past medical history, past social history, past surgical history, and problem list.  Review of Systems Pertinent items noted in HPI and remainder of comprehensive ROS otherwise negative.   Objective:   Blood pressure 105/73, pulse 80, height 5\' 3"  (1.6 m), weight 180 lb 12.8 oz (82 kg), not currently breastfeeding.  Body mass index is 32.51 kg/m. General appearance: alert and no distress Abdomen: soft, non-tender; bowel sounds normal; no masses,  no organomegaly Pelvic: external genitalia normal. Small suburethral diverticulum noted.  Labia minora appear normal visually, however palpable nodule or cystic lesion, ~ 2 x 2 cm in deep cutaneous tissue at vaginal introitus at ~ 5 o'clock, mobile, slightly tender. Small amount of thin white discharge in vaginal vault, no odor.  Cervix appears normal no lesions or discharge.     Assessment:   1. Bartholin gland cyst      Plan:   Based on presentation and description of area involved, patient may have had a recent Bartholin's gland cyst that ruptured. Patient also presented pictures of lesion at time of rupture which also helps to confirm. Small portion of cyst may still be present, palpable on today's exam, however extends deeper in the tissue so most likely will not drain if incised today. Superficial  layer of Bartholin's gland appears to have already healed.  Discussed option of sitz baths and round of antibiotics to help clear remaining fluid. Patient ok to utilize. Will prescribe Doxycycline for 5 days BID.

## 2023-05-30 ENCOUNTER — Ambulatory Visit
Admission: RE | Admit: 2023-05-30 | Discharge: 2023-05-30 | Disposition: A | Discharge status: Home / Self Care | Payer: 59 | Source: Ambulatory Visit | Attending: Emergency Medicine | Admitting: Emergency Medicine

## 2023-05-30 ENCOUNTER — Other Ambulatory Visit: Payer: Self-pay

## 2023-05-30 VITALS — BP 126/90 | HR 101 | Temp 98.1°F | Resp 18

## 2023-05-30 DIAGNOSIS — J069 Acute upper respiratory infection, unspecified: Secondary | ICD-10-CM

## 2023-05-30 DIAGNOSIS — H109 Unspecified conjunctivitis: Secondary | ICD-10-CM | POA: Diagnosis not present

## 2023-05-30 LAB — POCT RAPID STREP A (OFFICE): Rapid Strep A Screen: NEGATIVE

## 2023-05-30 MED ORDER — GUAIFENESIN-CODEINE 100-10 MG/5ML PO SOLN: 5.0000 mL | Freq: Four times a day (QID) | ORAL | 0 refills | Status: AC | PRN

## 2023-05-30 MED ORDER — PROMETHAZINE-DM 6.25-15 MG/5ML PO SYRP: 5.0000 mL | ORAL_SOLUTION | Freq: Four times a day (QID) | ORAL | 0 refills | Status: AC | PRN

## 2023-05-30 MED ORDER — MOXIFLOXACIN HCL 0.5 % OP SOLN: 1.0000 [drp] | Freq: Three times a day (TID) | OPHTHALMIC | 0 refills | Status: AC

## 2023-05-30 MED ORDER — AMOXICILLIN-POT CLAVULANATE 875-125 MG PO TABS: 1.0000 | ORAL_TABLET | Freq: Two times a day (BID) | ORAL | 0 refills | Status: AC

## 2023-05-30 NOTE — ED Triage Notes (Signed)
 Tient presents to [facility] for evaluation of left eye redness, itchiness, with some clumping of discharge on the eye, but not green or goopy.  Then pain began the next day.  No changes in vision.  Sore throat since 2 weeks with lingering cough, after a round of antibiotics.

## 2023-05-30 NOTE — ED Provider Notes (Signed)
 Carol Carter    CSN: [id number] Arrival date & time: 05/30/23  1454      History   Chief Complaint Chief Complaint  Patient presents with   Eye Problem    Redness and soreness in eye and throat sore it is hard to swallow anything including water - Entered by patient    HPI Carol Carter is a 33 y.o. female.   Patient presents for evaluation of mild nasal congestion and a nonproductive cough present for 2 weeks, sore throat beginning over the past 3 days, described as painful and severe making it difficult to swallow.  Able to tolerate food and liquids.  Has attempted use of salt water gargles, Tessalon and prednisone which have been ineffective.  Concern with left eye erythema, drainage and pruritus present for 3 days.  Denies use of contacts.  Denies injury or trauma, light sensitivity or blurred vision.  Has attempted use of over-the-counter drops with no relief.  Past Medical History:  Diagnosis Date   Asthma    teenager   Dysmenorrhea    History of kidney stones    Left ovarian cyst 08/2019   Sleep apnea 2017   does not wear cpap    Patient Active Problem List   Diagnosis Date Noted   Encounter for planned induction of labor 03/31/2022   Oligohydramnios 03/30/2022   Encounter for supervision of normal first pregnancy in third trimester 03/29/2022   Post-term pregnancy, 40-42 weeks of gestation 03/29/2022   Obesity in pregnancy, antepartum 01/29/2022   Supervision of other normal pregnancy, antepartum 08/11/2021   Furuncle of left axilla 06/24/2021   Left ovarian cyst    GBS bacteriuria 08/12/2019   LGSIL on Pap smear of cervix 09/22/2017   Dysmenorrhea 09/22/2017   Pelvic pain 09/15/2017   Irregular intermenstrual bleeding 09/15/2017    Past Surgical History:  Procedure Laterality Date   CESAREAN SECTION  04/01/2022   Procedure: CESAREAN SECTION;  Surgeon: Hildred Laser, MD;  Location: [facility] ORS;  Service: Obstetrics;;   CYSTOSCOPY  08/08/2019    LAPAROSCOPIC OVARIAN CYSTECTOMY Left 08/15/2019   Procedure: LAPAROSCOPIC OVARIAN CYSTECTOMY;  Surgeon: Nadara Mustard, MD;  Location: [facility] ORS;  Service: Gynecology;  Laterality: Left;   WISDOM TOOTH EXTRACTION  07/17/2016    OB History     Gravida  1   Para  1   Term  1   Preterm  0   AB  0   Living  1      SAB  0   IAB  0   Ectopic  0   Multiple  0   Live Births  1            Home Medications    Prior to Admission medications   Medication Sig Start Date End Date Taking? Authorizing Provider  amoxicillin-clavulanate (AUGMENTIN) 875-125 MG tablet Take 1 tablet by mouth every 12 (twelve) hours. 05/30/23  Yes Lucky Alverson R, NP  guaiFENesin-codeine 100-10 MG/5ML syrup Take 5 mLs by mouth every 6 (six) hours as needed for cough. 05/30/23  Yes Aissa Lisowski R, NP  moxifloxacin (VIGAMOX) 0.5 % ophthalmic solution Place 1 drop into the left eye 3 (three) times daily. 05/30/23  Yes Amiree No R, NP  promethazine-dextromethorphan (PROMETHAZINE-DM) 6.25-15 MG/5ML syrup Take 5 mLs by mouth 4 (four) times daily as needed. 05/30/23  Yes Korbyn Vanes R, NP  ferrous sulfate 325 (65 FE) MG tablet Take 1 tablet (325 mg total) by mouth daily with breakfast.  04/03/22   Glenetta Borg, CNM  Prenatal Vit-Fe Fumarate-FA (MULTIVITAMIN-PRENATAL) 27-0.8 MG TABS tablet Take 1 tablet by mouth daily at 12 noon.    [provider]    Family History Family History  Problem Relation Age of Onset   Thyroid disease Mother    Hypertension Brother    Liver disease Brother    Colon cancer Maternal Aunt 28   Diabetes Father    Hypertension Father    Heart Problems Father     Social History Social History   Tobacco Use   Smoking status: Former    Current packs/day: 0.00    Types: Cigarettes    Quit date: 07/28/2021    Years since quitting: 1.8   Smokeless tobacco: Never  Vaping Use   Vaping status: Never Used  Substance Use Topics   Alcohol use: Not  Currently    Comment: Occasional   Drug use: Not Currently    Types: Marijuana    Comment: over 10 years ago     Allergies   Patient has no known allergies.   Review of Systems Review of Systems   Physical Exam Triage Vital Signs ED Triage Vitals  Encounter Vitals Group     BP 05/30/23 1527 (!) 126/90     Systolic BP Percentile --      Diastolic BP Percentile --      Pulse Rate 05/30/23 1527 (!) 101     Resp 05/30/23 1527 18     Temp 05/30/23 1527 98.1 F (36.7 C)     Temp Source 05/30/23 1527 Oral     SpO2 05/30/23 1527 97 %     Weight --      Height --      Head Circumference --      Peak Flow --      Pain Score 05/30/23 1528 3     Pain Loc --      Pain Education --      Exclude from Growth Chart --    No data found.  Updated Vital Signs BP (!) 126/90 (BP Location: Left Arm)   Pulse (!) 101   Temp 98.1 F (36.7 C) (Oral)   Resp 18   SpO2 97%   Visual Acuity Right Eye Distance:   Left Eye Distance:   Bilateral Distance:    Right Eye Near:   Left Eye Near:    Bilateral Near:     Physical Exam Constitutional:      Appearance: Normal appearance.  HENT:     Right Ear: Tympanic membrane, ear canal and external ear normal.     Left Ear: Tympanic membrane, ear canal and external ear normal.     Nose: Congestion present. No rhinorrhea.     Mouth/Throat:     Pharynx: Posterior oropharyngeal erythema present. No oropharyngeal exudate.  Eyes:     Comments: Erythema present to the left conjunctiva with mild periorbital swelling, no drainage noted on exam, vision grossly intact, extraocular movement  Cardiovascular:     Rate and Rhythm: Normal rate and regular rhythm.     Pulses: Normal pulses.     Heart sounds: Normal heart sounds.  Pulmonary:     Effort: Pulmonary effort is normal.     Breath sounds: Normal breath sounds.  Neurological:     Mental Status: She is alert and oriented to person, place, and time. Mental status is at baseline.      UC  Treatments / Results  Labs (all labs  ordered are listed, but only abnormal results are displayed) Labs Reviewed  POCT RAPID STREP A (OFFICE) - Normal    EKG   Radiology No results found.  Procedures Procedures (including critical care time)  Medications Ordered in UC Medications - No data to display  Initial Impression / Assessment and Plan / UC Course  I have reviewed the triage vital signs and the nursing notes.  Pertinent labs & imaging results that were available during my care of the patient were reviewed by me and considered in my medical decision making (see chart for details).  Acute URI, bacterial conjunctivitis of left eye  Patient is in no signs of distress nor toxic appearing.  Vital signs are stable.  Low suspicion for pneumonia, pneumothorax or bronchitis and therefore will defer imaging.  Rapid strep test negative, viral testing deferred due to timeline of illness, symptoms present 2 weeks prescribed Augmentin, presentation of the eyes consistent with conjunctivitis therefore prescribed moxifloxacin and additionally prescribed Promethazine DM for management of coughMay use additional over-the-counter medications as needed for supportive care.  May follow-up with urgent care as needed if symptoms persist or worsen.   Final Clinical Impressions(s) / UC Diagnoses   Final diagnoses:  Acute URI  Bacterial conjunctivitis of left eye     Discharge Instructions      Begin Augmentin every morning and every evening for 7 days  May use cough syrup every 6 hours as needed  For your eyes, place 1 drop of moxifloxacin into the left eye every 8 hours for 7 days    You can take Tylenol and/or Ibuprofen as needed for fever reduction and pain relief.   For cough: honey 1/2 to 1 teaspoon (you can dilute the honey in water or another fluid).  You can also use guaifenesin and dextromethorphan for cough. You can use a humidifier for chest congestion and cough.  If you don't  have a humidifier, you can sit in the bathroom with the hot shower running.      For sore throat: try warm salt water gargles, cepacol lozenges, throat spray, warm tea or water with lemon/honey, popsicles or ice, or OTC cold relief medicine for throat discomfort.   For congestion: take a daily anti-histamine like Zyrtec, Claritin, and a oral decongestant, such as pseudoephedrine.  You can also use Flonase 1-2 sprays in each nostril daily.   It is important to stay hydrated: drink plenty of fluids (water, gatorade/powerade/pedialyte, juices, or teas) to keep your throat moisturized and help further relieve irritation/discomfort.    ED Prescriptions     Medication Sig Dispense Auth. Provider   moxifloxacin (VIGAMOX) 0.5 % ophthalmic solution Place 1 drop into the left eye 3 (three) times daily. 3 mL Iara Monds R, NP   amoxicillin-clavulanate (AUGMENTIN) 875-125 MG tablet Take 1 tablet by mouth every 12 (twelve) hours. 14 tablet Ladale Sherburn R, NP   guaiFENesin-codeine 100-10 MG/5ML syrup Take 5 mLs by mouth every 6 (six) hours as needed for cough. 120 mL Md Smola R, NP   promethazine-dextromethorphan (PROMETHAZINE-DM) 6.25-15 MG/5ML syrup Take 5 mLs by mouth 4 (four) times daily as needed. 118 mL Boomer Winders, Elita Boone, NP      I have reviewed the PDMP during this encounter.   Valinda Hoar, NP 05/30/23 [phone]

## 2023-05-30 NOTE — Discharge Instructions (Signed)
 Begin Augmentin every morning and every evening for 7 days  May use cough syrup every 6 hours as needed  For your eyes, place 1 drop of moxifloxacin into the left eye every 8 hours for 7 days    You can take Tylenol and/or Ibuprofen as needed for fever reduction and pain relief.   For cough: honey 1/2 to 1 teaspoon (you can dilute the honey in water or another fluid).  You can also use guaifenesin and dextromethorphan for cough. You can use a humidifier for chest congestion and cough.  If you don't have a humidifier, you can sit in the bathroom with the hot shower running.      For sore throat: try warm salt water gargles, cepacol lozenges, throat spray, warm tea or water with lemon/honey, popsicles or ice, or OTC cold relief medicine for throat discomfort.   For congestion: take a daily anti-histamine like Zyrtec, Claritin, and a oral decongestant, such as pseudoephedrine.  You can also use Flonase 1-2 sprays in each nostril daily.   It is important to stay hydrated: drink plenty of fluids (water, gatorade/powerade/pedialyte, juices, or teas) to keep your throat moisturized and help further relieve irritation/discomfort.

## 2023-11-23 NOTE — Progress Notes (Unsigned)
    NURSE VISIT NOTE  Subjective:    Patient ID: Carol Carter, female    DOB: [birth year], 33 y.o.   MRN: [id number]       HPI  Patient is a 33 y.o. G60P1001 female who presents for {UTI Symptoms:210800002} for {0-10:33138} {TIME; UNITS DAY/WEEK/MONTH:19136}.  Patient denies {UTI Symptoms:210800002}.  Patient {does/does not:33181} have a history of recurrent UTI.  Patient {does/does not:33181} have a history of pyelonephritis.    Objective:    There were no vitals taken for this visit.   Lab Review  No results found for any visits on 11/24/23.  Assessment:   No diagnosis found.   Plan:   {AOB UTI PLAN:28528:p}   Rollo JINNY Maxin, CMA

## 2023-11-24 ENCOUNTER — Ambulatory Visit (INDEPENDENT_AMBULATORY_CARE_PROVIDER_SITE_OTHER)

## 2023-11-24 VITALS — BP 123/77 | HR 85 | Ht 63.0 in | Wt 179.4 lb

## 2023-11-24 DIAGNOSIS — R399 Unspecified symptoms and signs involving the genitourinary system: Secondary | ICD-10-CM

## 2023-11-24 LAB — POCT URINALYSIS DIPSTICK
Bilirubin, UA: NEGATIVE
Blood, UA: POSITIVE
Glucose, UA: NEGATIVE
Ketones, UA: NEGATIVE
Leukocytes, UA: NEGATIVE
Nitrite, UA: NEGATIVE
Protein, UA: NEGATIVE
Spec Grav, UA: 1.015 (ref 1.010–1.025)
Urobilinogen, UA: 0.2 U/dL
pH, UA: 7.5 (ref 5.0–8.0)

## 2023-11-24 MED ORDER — SULFAMETHOXAZOLE-TRIMETHOPRIM 800-160 MG PO TABS: 1.0000 | ORAL_TABLET | Freq: Two times a day (BID) | ORAL | 1 refills | Status: AC

## 2023-11-27 LAB — URINE CULTURE

## 2024-01-10 ENCOUNTER — Encounter: Payer: Self-pay | Admitting: Licensed Practical Nurse
